# Patient Record
Sex: Female | Born: 1980
Health system: Southern US, Community
[De-identification: ages and names within clinical notes are randomized; demographics above are authoritative.]

## PROBLEM LIST (undated history)

## (undated) DIAGNOSIS — F411 Generalized anxiety disorder: Secondary | ICD-10-CM

## (undated) DIAGNOSIS — R7989 Other specified abnormal findings of blood chemistry: Secondary | ICD-10-CM

## (undated) DIAGNOSIS — K589 Irritable bowel syndrome without diarrhea: Secondary | ICD-10-CM

## (undated) DIAGNOSIS — I10 Essential (primary) hypertension: Secondary | ICD-10-CM

## (undated) DIAGNOSIS — R569 Unspecified convulsions: Secondary | ICD-10-CM

## (undated) DIAGNOSIS — Z973 Presence of spectacles and contact lenses: Secondary | ICD-10-CM

## (undated) DIAGNOSIS — Z86718 Personal history of other venous thrombosis and embolism: Secondary | ICD-10-CM

## (undated) DIAGNOSIS — K219 Gastro-esophageal reflux disease without esophagitis: Secondary | ICD-10-CM

## (undated) DIAGNOSIS — N939 Abnormal uterine and vaginal bleeding, unspecified: Secondary | ICD-10-CM

## (undated) DIAGNOSIS — F32A Depression, unspecified: Secondary | ICD-10-CM

## (undated) DIAGNOSIS — I82409 Acute embolism and thrombosis of unspecified deep veins of unspecified lower extremity: Secondary | ICD-10-CM

## (undated) DIAGNOSIS — N3010 Interstitial cystitis (chronic) without hematuria: Secondary | ICD-10-CM

## (undated) DIAGNOSIS — R87618 Other abnormal cytological findings on specimens from cervix uteri: Secondary | ICD-10-CM

## (undated) DIAGNOSIS — D751 Secondary polycythemia: Secondary | ICD-10-CM

## (undated) DIAGNOSIS — M797 Fibromyalgia: Secondary | ICD-10-CM

## (undated) DIAGNOSIS — I493 Ventricular premature depolarization: Secondary | ICD-10-CM

## (undated) DIAGNOSIS — Z7901 Long term (current) use of anticoagulants: Secondary | ICD-10-CM

## (undated) DIAGNOSIS — Z8742 Personal history of other diseases of the female genital tract: Secondary | ICD-10-CM

## (undated) DIAGNOSIS — F329 Major depressive disorder, single episode, unspecified: Secondary | ICD-10-CM

## (undated) DIAGNOSIS — G43909 Migraine, unspecified, not intractable, without status migrainosus: Secondary | ICD-10-CM

## (undated) DIAGNOSIS — I639 Cerebral infarction, unspecified: Secondary | ICD-10-CM

## (undated) DIAGNOSIS — E782 Mixed hyperlipidemia: Secondary | ICD-10-CM

## (undated) DIAGNOSIS — N390 Urinary tract infection, site not specified: Secondary | ICD-10-CM

## (undated) DIAGNOSIS — Z8639 Personal history of other endocrine, nutritional and metabolic disease: Secondary | ICD-10-CM

## (undated) DIAGNOSIS — R8789 Other abnormal findings in specimens from female genital organs: Secondary | ICD-10-CM

## (undated) DIAGNOSIS — F419 Anxiety disorder, unspecified: Secondary | ICD-10-CM

## (undated) HISTORY — PX: CARPAL TUNNEL RELEASE: SHX101

## (undated) HISTORY — DX: Acute embolism and thrombosis of unspecified deep veins of unspecified lower extremity: I82.409

## (undated) HISTORY — PX: INTERSTIM IMPLANT PLACEMENT: SHX5130

## (undated) HISTORY — PX: TUBAL LIGATION: SHX77

## (undated) HISTORY — PX: OTHER SURGICAL HISTORY: SHX169

## (undated) HISTORY — DX: Anxiety disorder, unspecified: F41.9

## (undated) HISTORY — DX: Depression, unspecified: F32.A

## (undated) HISTORY — DX: Other abnormal cytological findings on specimens from cervix uteri: R87.618

---
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
## 2004-01-05 ENCOUNTER — Encounter (INDEPENDENT_AMBULATORY_CARE_PROVIDER_SITE_OTHER): Payer: Self-pay | Admitting: Specialist

## 2004-01-05 ENCOUNTER — Ambulatory Visit (HOSPITAL_COMMUNITY): Admission: RE | Admit: 2004-01-05 | Discharge: 2004-01-05 | Payer: Self-pay | Admitting: Urology

## 2004-01-05 ENCOUNTER — Ambulatory Visit (HOSPITAL_BASED_OUTPATIENT_CLINIC_OR_DEPARTMENT_OTHER): Admission: RE | Admit: 2004-01-05 | Discharge: 2004-01-05 | Payer: Self-pay | Admitting: Urology

## 2004-01-05 HISTORY — PX: CYSTOSCOPY WITH HYDRODISTENSION AND BIOPSY: SHX5127

## 2009-02-17 HISTORY — PX: INTERSTIM IMPLANT REMOVAL: SHX5131

## 2009-02-20 ENCOUNTER — Ambulatory Visit (HOSPITAL_COMMUNITY): Admission: AD | Admit: 2009-02-20 | Discharge: 2009-02-20 | Payer: Self-pay | Admitting: Urology

## 2010-03-07 DIAGNOSIS — R002 Palpitations: Secondary | ICD-10-CM | POA: Insufficient documentation

## 2010-10-26 ENCOUNTER — Encounter (INDEPENDENT_AMBULATORY_CARE_PROVIDER_SITE_OTHER): Payer: Self-pay | Admitting: Obstetrics

## 2010-10-26 ENCOUNTER — Ambulatory Visit (HOSPITAL_COMMUNITY)
Admission: AD | Admit: 2010-10-26 | Discharge: 2010-10-26 | Payer: Self-pay | Source: Home / Self Care | Attending: Obstetrics | Admitting: Obstetrics

## 2010-10-26 HISTORY — PX: DILATION AND CURETTAGE OF UTERUS: SHX78

## 2010-11-04 ENCOUNTER — Encounter
Admission: RE | Admit: 2010-11-04 | Discharge: 2010-11-04 | Payer: Self-pay | Source: Home / Self Care | Attending: Obstetrics & Gynecology | Admitting: Obstetrics & Gynecology

## 2010-11-04 LAB — TYPE AND SCREEN
ABO/RH(D): B NEG
Antibody Screen: POSITIVE
DAT, IgG: NEGATIVE
Unit division: 0
Unit division: 0

## 2010-11-04 LAB — HCG, QUANTITATIVE, PREGNANCY
hCG, Beta Chain, Quant, S: 13506 m[IU]/mL — ABNORMAL HIGH (ref <5)
hCG, Beta Chain, Quant, S: 9460 m[IU]/mL — ABNORMAL HIGH (ref <5)

## 2010-11-04 LAB — CBC
HCT: 36 % (ref 36.0–46.0)
HCT: 35.5 % — ABNORMAL LOW (ref 36.0–46.0)
Hemoglobin: 12.3 g/dL (ref 12.0–15.0)
Hemoglobin: 12.3 g/dL (ref 12.0–15.0)
MCH: 29.1 pg (ref 26.0–34.0)
MCH: 29.4 pg (ref 26.0–34.0)
MCHC: 34.2 g/dL (ref 30.0–36.0)
MCHC: 34.6 g/dL (ref 30.0–36.0)
MCV: 85.1 fL (ref 78.0–100.0)
MCV: 84.9 fL (ref 78.0–100.0)
Platelets: 243 10*3/uL (ref 150–400)
Platelets: 242 10*3/uL (ref 150–400)
RBC: 4.23 MIL/uL (ref 3.87–5.11)
RBC: 4.18 MIL/uL (ref 3.87–5.11)
RDW: 11.9 % (ref 11.5–15.5)
RDW: 11.7 % (ref 11.5–15.5)
WBC: 7.2 10*3/uL (ref 4.0–10.5)
WBC: 6.8 10*3/uL (ref 4.0–10.5)

## 2011-01-01 DIAGNOSIS — K589 Irritable bowel syndrome without diarrhea: Secondary | ICD-10-CM | POA: Insufficient documentation

## 2011-01-28 LAB — PREGNANCY, URINE: Preg Test, Ur: NEGATIVE

## 2011-01-28 LAB — HEMOGLOBIN AND HEMATOCRIT, BLOOD
HCT: 40.8 % (ref 36.0–46.0)
Hemoglobin: 14.3 g/dL (ref 12.0–15.0)

## 2011-03-04 NOTE — Op Note (Signed)
NAMEHUBERT, Linda [name]                 ACCOUNT NO.:  [id number]   MEDICAL RECORD NO.:  [id number]          PATIENT TYPE:  AMB   LOCATION:  DAY                          FACILITY:  [facility]   PHYSICIAN:  Martina Sinner, MD DATE OF BIRTH:  [birth year]   DATE OF PROCEDURE:  02/20/2009  DATE OF DISCHARGE:                               OPERATIVE REPORT   ASSISTANT:  Delia Chimes, NP.   SURGERY:  Malfunctioning InterStim; removal of malfunctioning InterStim  under fluoroscopy.   [name] InterStim is turned off.  It was for refractory frequency  from interstitial cystitis.  Void trial.  Call me.  Discharged to home.  See me in 2 weeks.   The device was aggravating her and soiled when she would sit down.   The patient was prepped and draped in the usual fashion in the prone  position.  Fluoroscopy was utilized.  I opened the left buttock incision  and easily opened the pseudocapsule using cutting current, and stayed  away from the device.  I easily removed the device.   I opened the midline incision approximately the size of my fingertip.  I  could feel the lead deep in the bottom of the incision.  I was able to  place [place] around it and deliver it.   Under fluoroscopic guidance, I was trying to pull up gently in a  straight direction, and the lead would not come out.  I dissected down  to the bony table with soft tissue.  I was able to pull straight back  and deliver the lead with a  little bit of a twisting action.   Both incisions were irrigated.  Vicryl 3-0 and 4-0 Vicryl was used for  the closure.  The lead was removed in total.  The patient was sent home  with Vicodin and Keflex.           ______________________________  Martina Sinner, MD  Electronically Signed     SAM/MEDQ  D:  02/20/2009  T:  02/20/2009  Job:  [id number]

## 2011-03-07 NOTE — Op Note (Signed)
NAME:  Linda [name], Linda [name]                              ACCOUNT NO.:  [id number]   MEDICAL RECORD NO.:  [id number]                   PATIENT TYPE:  AMB   LOCATION:  [facility]                                 FACILITY:  [facility]   PHYSICIAN:  Jamison Neighbor, M.D.               DATE OF BIRTH:  [birth year]   DATE OF PROCEDURE:  01/05/2004  DATE OF DISCHARGE:                                 OPERATIVE REPORT   SERVICE:  Urology.   PREOPERATIVE DIAGNOSES:  Chronic pelvic pain, probable interstitial  cystitis.   POSTOPERATIVE DIAGNOSES:  Chronic pelvic pain, probable interstitial  cystitis.   PROCEDURE:  Cystoscopy, urethral calibration, hydrodistention of the  bladder, bladder biopsy, Marcaine and Pyridium installation, Marcaine and  Kenalog injection.   SURGEON:  Jamison Neighbor, M.D.   ANESTHESIA:  General.   COMPLICATIONS:  None.   DRAINS:  None.   BRIEF HISTORY:  This 30 year old female was felt to have interstitial  cystitis. She underwent a potassium test and had a partially positive test.  The patient's initial score with water was an urgency score of 1 and a pain  score of 0, with potassium her urgency score went to 2 and her pain score  went to 1 1/2.  This did indicate some sensitivity but fell short of the  threshold for a positive test because the published standard indicates that  with interstitial cystitis the test should go up by two points or greater.   The patient was told she probably had interstitial cystitis and we offered  her the option to start therapy; however, she felt  that she wished to have  a more definitive diagnosis.  She has agreed to undergo cystoscopy and  hydrodistention.  We note that the patient has subsequently told me that she  felt like she had increasing pain after she left the office following the  potassium test and this once again makes me even more convinced that there  is interstitial cystitis present.  However, the patient still feels that she  wishes to undergo hydrodistention. She understands the risks and benefits of  the procedure. Full and informed consent was obtained.   DESCRIPTION OF PROCEDURE:  After successful induction of general anesthesia,  the patient was placed in the dorsal lithotomy position, prepped with  Betadine and draped in the usual sterile fashion.  Careful bimanual  examination showed no abnormalities of the urethra, specifically no evidence  of a diverticulum. There was no cystocele, rectocele or enterocele. There  were no masses on bimanual exam.  The urethra was calibrated to [address] [place]  with female urethral sounds with no stenosis or stricture. The cystoscope  was inserted, the bladder was carefully inspected, no tumors or stones could  be seen. The patient underwent hydraulic over distention for five minutes at  100 cm pressure. When the bladder was drained, there was a terminal blood  tinge  and some glomerulation formation consistent with interstitial  cystitis. There was also one patchy area at the top that might be an early  Hunner's ulcer. The patient had almost a normal capacity at just under 1000  mL with normal being 1150 and the average for interstitial cystitis at 575.  We do note that the patient is very young and has had the condition for a  short period of time and it is very hopeful that with aggressive therapy she  will not progress to that level. The patient had a bladder biopsy, the  biopsy site was cauterized. This will be sent for mast cell analysis. The  bladder was drained, a mixture or Marcaine and Pyridium was left within the  bladder, Marcaine and Kenalog were injected periurethrally as a postop  block.  The patient received intraoperative Toradol and Zofran as well as a  B&O suppository. She tolerated the procedure well and was taken to the  recovery room in good condition.                                               Jamison Neighbor, M.D.    RJE/MEDQ  D:  01/05/2004  T:   01/05/2004  Job:  [id number]   cc:   Dois Davenport A. Rivard, M.D.  [address]  [place]  [place] [address]  Fax: [phone]

## 2011-10-21 DIAGNOSIS — Z8673 Personal history of transient ischemic attack (TIA), and cerebral infarction without residual deficits: Secondary | ICD-10-CM

## 2011-10-21 HISTORY — DX: Personal history of transient ischemic attack (TIA), and cerebral infarction without residual deficits: Z86.73

## 2012-09-27 DIAGNOSIS — M797 Fibromyalgia: Secondary | ICD-10-CM | POA: Insufficient documentation

## 2013-10-20 DIAGNOSIS — Z87898 Personal history of other specified conditions: Secondary | ICD-10-CM

## 2013-10-20 HISTORY — DX: Personal history of other specified conditions: Z87.898

## 2015-05-01 DIAGNOSIS — F411 Generalized anxiety disorder: Secondary | ICD-10-CM | POA: Insufficient documentation

## 2015-05-01 DIAGNOSIS — F988 Other specified behavioral and emotional disorders with onset usually occurring in childhood and adolescence: Secondary | ICD-10-CM | POA: Insufficient documentation

## 2015-05-01 DIAGNOSIS — F419 Anxiety disorder, unspecified: Secondary | ICD-10-CM | POA: Insufficient documentation

## 2015-07-08 ENCOUNTER — Emergency Department
Admission: EM | Admit: 2015-07-08 | Discharge: 2015-07-08 | Disposition: A | Discharge status: Home / Self Care | Payer: 59 | Source: Home / Self Care | Attending: Family Medicine | Admitting: Family Medicine

## 2015-07-08 ENCOUNTER — Encounter: Payer: Self-pay | Admitting: Emergency Medicine

## 2015-07-08 DIAGNOSIS — G43101 Migraine with aura, not intractable, with status migrainosus: Secondary | ICD-10-CM

## 2015-07-08 HISTORY — DX: Migraine, unspecified, not intractable, without status migrainosus: G43.909

## 2015-07-08 HISTORY — DX: Cerebral infarction, unspecified: I63.9

## 2015-07-08 HISTORY — DX: Unspecified convulsions: R56.9

## 2015-07-08 HISTORY — DX: Essential (primary) hypertension: I10

## 2015-07-08 MED ORDER — PROMETHAZINE HCL 25 MG PO TABS: 25.0000 mg | ORAL_TABLET | Freq: Four times a day (QID) | ORAL | Status: DC | PRN

## 2015-07-08 MED ORDER — METOCLOPRAMIDE HCL 5 MG/ML IJ SOLN
10.0000 mg | Freq: Once | INTRAMUSCULAR | Status: AC
  Administered 2015-07-08: 10 mg via INTRAMUSCULAR

## 2015-07-08 MED ORDER — KETOROLAC TROMETHAMINE 60 MG/2ML IM SOLN
60.0000 mg | Freq: Once | INTRAMUSCULAR | Status: AC
  Administered 2015-07-08: 60 mg via INTRAMUSCULAR

## 2015-07-08 MED ORDER — DIPHENHYDRAMINE HCL 25 MG PO CAPS
25.0000 mg | ORAL_CAPSULE | Freq: Once | ORAL | Status: AC
  Administered 2015-07-08: 25 mg via ORAL

## 2015-07-08 MED ORDER — DEXAMETHASONE SODIUM PHOSPHATE 10 MG/ML IJ SOLN
10.0000 mg | Freq: Once | INTRAMUSCULAR | Status: AC
  Administered 2015-07-08: 10 mg via INTRAMUSCULAR

## 2015-07-08 MED ORDER — HYDROCODONE-ACETAMINOPHEN 5-325 MG PO TABS: 1.0000 | ORAL_TABLET | Freq: Four times a day (QID) | ORAL | Status: DC | PRN

## 2015-07-08 NOTE — Discharge Instructions (Signed)
Norco/Vicodin (hydrocodone-acetaminophen) is a narcotic pain medication, do not combine these medications with others containing tylenol. While taking, do not drink alcohol, drive, or perform any other activities that requires focus while taking these medications.    Recurrent Migraine Headache A migraine headache is very bad, throbbing pain on one or both sides of your head. Recurrent migraines keep coming back. Talk to your doctor about what things may bring on (trigger) your migraine headaches. HOME CARE  Only take medicines as told by your doctor.  Lie down in a dark, quiet room when you have a migraine.  Keep a journal to find out if certain things bring on migraine headaches. For example, write down:  What you eat and drink.  How much sleep you get.  Any change to your diet or medicines.  Lessen how much alcohol you drink.  Quit smoking if you smoke.  Get enough sleep.  Lessen any stress in your life.  Keep lights dim if bright lights bother you or make your migraines worse. GET HELP IF:  Medicine does not help your migraines.  Your pain keeps coming back.  You have a fever. GET HELP RIGHT AWAY IF:   Your migraine becomes really bad.  You have a stiff neck.  You have trouble seeing.  Your muscles are weak, or you lose muscle control.  You lose your balance or have trouble walking.  You feel like you will pass out (faint), or you pass out.  You have really bad symptoms that are different than your first symptoms. MAKE SURE YOU:   Understand these instructions.  Will watch your condition.  Will get help right away if you are not doing well or get worse. Document Released: 07/15/2008 Document Revised: 10/11/2013 Document Reviewed: 06/13/2013 [facility] Patient Information 2015 [place], [place]. This information is not intended to replace advice given to you by your health care provider. Make sure you discuss any questions you have with your health care  provider.

## 2015-07-08 NOTE — ED Notes (Signed)
Pt c/o migraine HA x 2 days.  Nausea and vomitting.

## 2015-07-08 NOTE — ED Provider Notes (Signed)
CSN: [id number]     Arrival date & time 07/08/15  1621 History   First MD Initiated Contact with Patient 07/08/15 1638     Chief Complaint  Patient presents with  . Migraine   (Consider location/radiation/quality/duration/timing/severity/associated sxs/prior Treatment) HPI Pt is a 34yo female with hx of migraines, presenting to [facility] with c/o gradually worsening migraine headache for 2-3 days.  Pt states she is on daily Topamax and propranolol to help "keep migraines at bay" but states she cannot get rid of her current headache. Pain is aching, throbbing, 10/10 at this time, feels similar to previous migraines but more persistent. HA was gradual in onset, initially on Left side of head but now all over and radiating into her neck.  Associated blurred vision, photophobia, nausea and vomiting that started today.  Denies fever or chills. Denies head injuries or falls. States she works at [employer] where there is always stress. She does not recall any new stress and unsure what caused current migraine. Pt was being followed by a Headache specialist in [place] but states her provider recently left the practice so she is in the process of finding a new specialist by working with her PCP.  She has had reglan, toradol, and steroids before for her migraines.  Past Medical History  Diagnosis Date  . Hypertension   . Stroke   . Seizures   . Migraine    Past Surgical History  Procedure Laterality Date  . Carpal tunnel release    . Cesarean section    . Tenze     No family history on file. Social History  Substance Use Topics  . Smoking status: Unknown If Ever Smoked  . Smokeless tobacco: None  . Alcohol Use: No   OB History    No data available     Review of Systems  Eyes: Positive for photophobia and visual disturbance. Negative for pain and itching.  Gastrointestinal: Positive for nausea and vomiting. Negative for diarrhea.  Musculoskeletal: Positive for neck pain. Negative for myalgias,  arthralgias and neck stiffness.  Neurological: Positive for headaches. Negative for dizziness, seizures, syncope, facial asymmetry, speech difficulty, light-headedness and numbness.    Allergies  Review of patient's allergies indicates no known allergies.  Home Medications   Prior to Admission medications   Medication Sig Start Date End Date Taking? Authorizing Provider  Escitalopram Oxalate (LEXAPRO PO) Take by mouth.   Yes Historical Provider, MD  norethindrone-ethinyl estradiol-iron (ESTROSTEP FE,TILIA FE,TRI-LEGEST FE) 1-20/1-30/1-35 MG-MCG tablet Take 1 tablet by mouth daily.   Yes Historical Provider, MD  PROPRANOLOL HCL PO Take by mouth.   Yes Historical Provider, MD  Topiramate (TOPAMAX PO) Take by mouth.   Yes Historical Provider, MD  HYDROcodone-acetaminophen (NORCO/VICODIN) 5-325 MG per tablet Take 1-2 tablets by mouth every 6 (six) hours as needed. 07/08/15   Junius Finner, PA-C  promethazine (PHENERGAN) 25 MG tablet Take 1 tablet (25 mg total) by mouth every 6 (six) hours as needed for nausea or vomiting. 07/08/15   Junius Finner, PA-C   Meds Ordered and Administered this Visit   Medications  ketorolac (TORADOL) injection 60 mg (60 mg Intramuscular Given 07/08/15 1700)  metoCLOPramide (REGLAN) injection 10 mg (10 mg Intramuscular Given 07/08/15 1701)  diphenhydrAMINE (BENADRYL) capsule 25 mg (25 mg Oral Given 07/08/15 1655)  dexamethasone (DECADRON) injection 10 mg (10 mg Intramuscular Given 07/08/15 1702)    BP 118/82 mmHg  Pulse 66  Temp(Src) 98.3 F (36.8 C) (Oral)  Ht  (1.651 m)  Wt 174 lb (78.926 kg)  BMI 28.96 kg/m2  SpO2 100%  LMP 06/07/2015 (Approximate) No data found.   Physical Exam  Constitutional: She is oriented to person, place, and time. She appears well-developed and well-nourished. No distress.  HENT:  Head: Normocephalic and atraumatic.  Right Ear: Hearing, tympanic membrane, external ear and ear canal normal.  Left Ear: Hearing, tympanic  membrane, external ear and ear canal normal.  Nose: Nose normal.  Mouth/Throat: Uvula is midline, oropharynx is clear and moist and mucous membranes are normal.  Eyes: Conjunctivae and EOM are normal. Pupils are equal, round, and reactive to light. Right eye exhibits no discharge. Left eye exhibits no discharge. No scleral icterus.  Neck: Normal range of motion. Neck supple.  No nuchal rigidity or meningeal signs.  Cardiovascular: Normal rate, regular rhythm and normal heart sounds.   Pulmonary/Chest: Effort normal and breath sounds normal. No respiratory distress. She has no wheezes. She has no rales. She exhibits no tenderness.  Abdominal: Soft. She exhibits no distension. There is no rebound.  Musculoskeletal: Normal range of motion.  Neurological: She is alert and oriented to person, place, and time. She has normal strength. No cranial nerve deficit or sensory deficit. Coordination and gait normal. GCS eye subscore is 4. GCS verbal subscore is 5. GCS motor subscore is 6.  CN II-XII in tact. Speech is clear. Alert to person, place, and time. Normal coordination. Normal gait.  Skin: Skin is warm and dry. She is not diaphoretic.  Nursing note and vitals reviewed.   ED Course  Procedures (including critical care time)  Labs Review Labs Reviewed - No data to display  Imaging Review No results found.   MDM   1. Migraine with aura and with status migrainosus, not intractable    Pt with hx of migraines c/o persistent migraine that was gradual in onset, similar to prior migraines for 2-3 days. Normal neuro exam.  Tx in UC: Toradol  IM, Reglan  IM, Decadron  IM, benadryl  PO. Pt allowed to rest in darkened exam room for 20 minutes after medications were given.  Pt reports mild improvement in pain, improved from 10/10 to 7-8/10.   Recommended pt go to emergency department if she would like further relief of pain. Pt states she would prefer to go home and rest to see if  pain continues to improve. Pt requested tramadol, however, she is on Lexapro. Contraindication for giving tramadol as medication interaction may cause seizures. Pt given Norco instead. Pt's mother is at [facility] to drive her home. Pt states if pain medication and rest at home does not help, she will got to the emergency department. Encouraged her to continue working with PCP to find new headache specialist. Patient verbalized understanding and agreement with treatment plan.     Junius Finner, PA-C 07/08/15 [phone]

## 2015-12-28 DIAGNOSIS — Z8489 Family history of other specified conditions: Secondary | ICD-10-CM | POA: Insufficient documentation

## 2016-01-02 DIAGNOSIS — R569 Unspecified convulsions: Secondary | ICD-10-CM | POA: Insufficient documentation

## 2016-07-27 DIAGNOSIS — Z98891 History of uterine scar from previous surgery: Secondary | ICD-10-CM | POA: Insufficient documentation

## 2016-07-27 HISTORY — DX: History of uterine scar from previous surgery: Z98.891

## 2016-08-04 DIAGNOSIS — E559 Vitamin D deficiency, unspecified: Secondary | ICD-10-CM | POA: Insufficient documentation

## 2016-09-02 DIAGNOSIS — Z9851 Tubal ligation status: Secondary | ICD-10-CM

## 2016-09-02 HISTORY — DX: Tubal ligation status: Z98.51

## 2016-09-02 LAB — HM PAP SMEAR: HM Pap smear: NEGATIVE

## 2016-09-09 DIAGNOSIS — R87810 Cervical high risk human papillomavirus (HPV) DNA test positive: Secondary | ICD-10-CM | POA: Insufficient documentation

## 2016-09-09 HISTORY — DX: Cervical high risk human papillomavirus (HPV) DNA test positive: R87.810

## 2019-06-24 ENCOUNTER — Other Ambulatory Visit: Payer: Self-pay

## 2019-06-24 ENCOUNTER — Emergency Department (INDEPENDENT_AMBULATORY_CARE_PROVIDER_SITE_OTHER): Payer: PRIVATE HEALTH INSURANCE

## 2019-06-24 ENCOUNTER — Emergency Department
Admission: EM | Admit: 2019-06-24 | Discharge: 2019-06-24 | Disposition: A | Discharge status: Home / Self Care | Payer: 59 | Source: Home / Self Care

## 2019-06-24 ENCOUNTER — Encounter: Payer: Self-pay | Admitting: Emergency Medicine

## 2019-06-24 DIAGNOSIS — R300 Dysuria: Secondary | ICD-10-CM | POA: Diagnosis not present

## 2019-06-24 DIAGNOSIS — M545 Low back pain, unspecified: Secondary | ICD-10-CM

## 2019-06-24 DIAGNOSIS — R109 Unspecified abdominal pain: Secondary | ICD-10-CM

## 2019-06-24 DIAGNOSIS — I10 Essential (primary) hypertension: Secondary | ICD-10-CM

## 2019-06-24 LAB — POCT URINALYSIS DIP (MANUAL ENTRY)
Bilirubin, UA: NEGATIVE
Blood, UA: NEGATIVE
Glucose, UA: NEGATIVE mg/dL
Ketones, POC UA: NEGATIVE mg/dL
Leukocytes, UA: NEGATIVE
Nitrite, UA: NEGATIVE
Protein Ur, POC: NEGATIVE mg/dL
Spec Grav, UA: 1.01 (ref 1.010–1.025)
Urobilinogen, UA: 0.2 E.U./dL
pH, UA: 6.5 (ref 5.0–8.0)

## 2019-06-24 MED ORDER — TRAMADOL HCL 50 MG PO TABS: 50.0000 mg | ORAL_TABLET | Freq: Four times a day (QID) | ORAL | 0 refills | Status: DC | PRN

## 2019-06-24 NOTE — Discharge Instructions (Signed)
°  You may take 500mg  acetaminophen every 4-6 hours or in combination with ibuprofen 400-600mg  every 6-8 hours as needed for pain and inflammation.  Tramadol is strong pain medication. While taking, do not drink alcohol, drive, or perform any other activities that requires focus while taking these medications.   Please call to schedule a follow up appointment with family medicine next week for recheck of symptoms and for ongoing maintenance of your high blood pressure.  If your blood pressure remains untreated, you are at higher risk of heart attack, stroke, kidney failure, and early death.  Call 911 or go to the hospital if symptoms worsening- worsening pain, unable to urinate, fever, vomiting, or other new concerning symptoms develop.

## 2019-06-24 NOTE — ED Provider Notes (Signed)
[facility]    CSN: [id number] Arrival date & time: 06/24/19  1021      History   Chief Complaint Chief Complaint  Patient presents with  . Dysuria  . Back Pain    HPI Linda [name] is a 38 y.o. female.   HPI Linda [name] is a 38 y.o. female presenting to UC with c/o Right lower back pain for about 3 days and dysuria for about 5 days. Hx of UTIs in the past but symptoms feel different.  Back pain feels like a muscle spasm/pulled muscle as it is worse with movement but she does have burning at the end of her urine stream. No prior hx of kidney stones.  Pain is 8/10 at worst. 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  She has not been trying any medications for pain.  Denies fever, chills, n/v/d.  BP elevated in triage. Denies HA, dizziness, chest pain or SOB. Hx of HTN but she is not currently on BP medication and does not have a PCP.    Past Medical History:  Diagnosis Date  . Hypertension   . Migraine   . Seizures (HCC)   . Stroke [facility])     There are no active problems to display for this patient.   Past Surgical History:  Procedure Laterality Date  . CARPAL TUNNEL RELEASE    . CESAREAN SECTION    . tenze      OB History   No obstetric history on file.      Home Medications    Prior to Admission medications   Medication Sig Start Date End Date Taking? Authorizing Provider  traMADol (ULTRAM) 50 MG tablet Take 1 tablet (50 mg total) by mouth every 6 (six) hours as needed. 06/24/19   Lurene Shadow, PA-C    Family History Family History  Problem Relation Age of Onset  . Hypertension Mother   . Hypertension Father   . Clotting disorder Father     Social History Social History   Tobacco Use  . Smoking status: Never Smoker  . Smokeless tobacco: Never Used  Substance Use Topics  . Alcohol use: Yes  . Drug use: Not on file     Allergies   Patient has no known allergies.    Review of Systems Review of Systems  Constitutional: Negative for chills and fever.  HENT: Negative for congestion, ear pain, sore throat, trouble swallowing and voice change.   Respiratory: Negative for cough and shortness of breath.   Cardiovascular: Negative for chest pain and palpitations.  Gastrointestinal: Negative for abdominal pain, diarrhea, nausea and vomiting.  Genitourinary: Positive for dysuria and flank pain (Right). Negative for decreased urine volume, frequency, genital sores, hematuria, pelvic pain, urgency, vaginal discharge and vaginal pain.  Musculoskeletal: Positive for back pain and myalgias. Negative for arthralgias.  Skin: Negative for rash.  Neurological: Negative for dizziness and headaches.     Physical Exam Triage Vital Signs ED Triage Vitals  Enc Vitals Group     BP 06/24/19 1047 (!) 167/106     Pulse Rate 06/24/19 1047 83     Resp --      Temp 06/24/19 1047 98.5 F (36.9 C)     Temp Source 06/24/19 1047 Oral     SpO2 06/24/19 1047 97 %     Weight 06/24/19 1049 212 lb (96.2 kg)  Height 06/24/19 1049 5\' 5"  (1.651 m)     Head Circumference --      Peak Flow --      Pain Score 06/24/19 1048 8     Pain Loc --      Pain Edu? --      Excl. in GC? --    No data found.  Updated Vital Signs BP (!) 158/105 (BP Location: Left Arm)   Pulse 83   Temp 98.5 F (36.9 C) (Oral)   Ht 5\' 5"  (1.651 m)   Wt 212 lb (96.2 kg)   LMP 06/10/2019 (Exact Date)   SpO2 97%   BMI 35.28 kg/m   Visual Acuity Right Eye Distance:   Left Eye Distance:   Bilateral Distance:    Right Eye Near:   Left Eye Near:    Bilateral Near:     Physical Exam Vitals signs and nursing note reviewed.  Constitutional:      General: She is not in acute distress.    Appearance: Normal appearance. She is well-developed. She is not ill-appearing, toxic-appearing or diaphoretic.  HENT:     Head: Normocephalic and atraumatic.     Mouth/Throat:     Mouth: Mucous membranes are  moist.  Neck:     Musculoskeletal: Normal range of motion.  Cardiovascular:     Rate and Rhythm: Normal rate and regular rhythm.  Pulmonary:     Effort: Pulmonary effort is normal.     Breath sounds: Normal breath sounds.  Abdominal:     General: There is no distension.     Palpations: Abdomen is soft.     Tenderness: There is no abdominal tenderness. There is right CVA tenderness. There is no left CVA tenderness.  Musculoskeletal: Normal range of motion.        General: Tenderness (right lumbar muscles) present.       Back:  Skin:    General: Skin is warm and dry.     Findings: No bruising, erythema or rash.  Neurological:     Mental Status: She is alert and oriented to person, place, and time.  Psychiatric:        Behavior: Behavior normal.      UC Treatments / Results  Labs (all labs ordered are listed, but only abnormal results are displayed) Labs Reviewed  URINE CULTURE  POCT URINALYSIS DIP (MANUAL ENTRY)    EKG   Radiology [place] Renal  Result Date: 06/24/2019 CLINICAL DATA:  Right-sided flank pain EXAM: RENAL / URINARY TRACT ULTRASOUND COMPLETE COMPARISON:  None. FINDINGS: Right Kidney: Renal measurements: 11.7 x 5.2 x 5.4 cm. = volume: 172 mL . Echogenicity within normal limits. No mass or hydronephrosis visualized. Left Kidney: Renal measurements: 11.0 x 5.8 x 5.3 cm = volume: 179 mL. Echogenicity within normal limits. No mass or hydronephrosis visualized. Bladder: Appears normal for degree of bladder distention. Prevoid volume of 221 mL is noted with a postvoid volume of 18 mL. IMPRESSION: No acute abnormality noted. Electronically Signed   By: Alcide CleverMark  Lukens M.D.   On: 06/24/2019 12:10    Procedures Procedures (including critical care time)  Medications Ordered in UC Medications - No data to display  Initial Impression / Assessment and Plan / UC Course  I have reviewed the triage vital signs and the nursing notes.  Pertinent labs & imaging results that were  available during my care of the patient were reviewed by me and considered in my medical decision making (see chart for details).  UA unremarkable Culture sent Reviewed U/S with pt Offered muscle relaxer for suspected back muscle strain, pt declined stating she did take one of her mother's clonopin last night w/o relief.  Stressed importance of f/u with PCP for her BP AVS provided.  Final Clinical Impressions(s) / UC Diagnoses   Final diagnoses:  Dysuria  Uncontrolled hypertension  Acute right-sided low back pain without sciatica     Discharge Instructions      You may take 500mg  acetaminophen every 4-6 hours or in combination with ibuprofen 400-600mg  every 6-8 hours as needed for pain and inflammation.  Tramadol is strong pain medication. While taking, do not drink alcohol, drive, or perform any other activities that requires focus while taking these medications.   Please call to schedule a follow up appointment with family medicine next week for recheck of symptoms and for ongoing maintenance of your high blood pressure.  If your blood pressure remains untreated, you are at higher risk of heart attack, stroke, kidney failure, and early death.  Call 911 or go to the hospital if symptoms worsening- worsening pain, unable to urinate, fever, vomiting, or other new concerning symptoms develop.       ED Prescriptions    Medication Sig Dispense Auth. Provider   traMADol (ULTRAM) 50 MG tablet Take 1 tablet (50 mg total) by mouth every 6 (six) hours as needed. 10 tablet Noe Gens, PA-C     Controlled Substance Prescriptions [place] Controlled Substance Registry consulted? Yes, I have consulted the [place] Controlled Substances Registry for this patient, and feel the risk/benefit ratio today is favorable for proceeding with this prescription for a controlled substance.   Noe Gens, [place] 06/24/19 1228

## 2019-06-24 NOTE — ED Triage Notes (Signed)
Dysuria x 5 days, past 3 days, LBP on right side, spasms

## 2019-06-26 ENCOUNTER — Telehealth: Payer: Self-pay | Admitting: Emergency Medicine

## 2019-06-26 LAB — URINE CULTURE
MICRO NUMBER:: 849315
SPECIMEN QUALITY:: ADEQUATE

## 2019-06-26 NOTE — Telephone Encounter (Signed)
Patient called with urine culture results; she will start on Keflex 500mg  po bid for 7 days per Leeroy Cha; she has not improved and has made appt. With her PCP for this coming week.

## 2019-08-05 DIAGNOSIS — N921 Excessive and frequent menstruation with irregular cycle: Secondary | ICD-10-CM | POA: Insufficient documentation

## 2019-08-05 HISTORY — DX: Excessive and frequent menstruation with irregular cycle: N92.1

## 2019-11-30 ENCOUNTER — Other Ambulatory Visit: Payer: Self-pay

## 2019-11-30 ENCOUNTER — Emergency Department (INDEPENDENT_AMBULATORY_CARE_PROVIDER_SITE_OTHER)
Admission: EM | Admit: 2019-11-30 | Discharge: 2019-11-30 | Disposition: A | Discharge status: Home / Self Care | Payer: BC Managed Care – PPO | Source: Home / Self Care

## 2019-11-30 ENCOUNTER — Encounter: Payer: Self-pay | Admitting: *Deleted

## 2019-11-30 DIAGNOSIS — R202 Paresthesia of skin: Secondary | ICD-10-CM

## 2019-11-30 DIAGNOSIS — Z882 Allergy status to sulfonamides status: Secondary | ICD-10-CM | POA: Diagnosis not present

## 2019-11-30 DIAGNOSIS — M542 Cervicalgia: Secondary | ICD-10-CM

## 2019-11-30 DIAGNOSIS — I10 Essential (primary) hypertension: Secondary | ICD-10-CM

## 2019-11-30 DIAGNOSIS — Z8673 Personal history of transient ischemic attack (TIA), and cerebral infarction without residual deficits: Secondary | ICD-10-CM | POA: Diagnosis not present

## 2019-11-30 DIAGNOSIS — R110 Nausea: Secondary | ICD-10-CM | POA: Diagnosis not present

## 2019-11-30 DIAGNOSIS — Z87891 Personal history of nicotine dependence: Secondary | ICD-10-CM | POA: Diagnosis not present

## 2019-11-30 DIAGNOSIS — Z79899 Other long term (current) drug therapy: Secondary | ICD-10-CM | POA: Diagnosis not present

## 2019-11-30 DIAGNOSIS — R079 Chest pain, unspecified: Secondary | ICD-10-CM

## 2019-11-30 DIAGNOSIS — Z888 Allergy status to other drugs, medicaments and biological substances status: Secondary | ICD-10-CM | POA: Diagnosis not present

## 2019-11-30 DIAGNOSIS — K219 Gastro-esophageal reflux disease without esophagitis: Secondary | ICD-10-CM | POA: Diagnosis not present

## 2019-11-30 DIAGNOSIS — G459 Transient cerebral ischemic attack, unspecified: Secondary | ICD-10-CM | POA: Diagnosis not present

## 2019-11-30 DIAGNOSIS — M5489 Other dorsalgia: Secondary | ICD-10-CM | POA: Diagnosis not present

## 2019-11-30 DIAGNOSIS — R0789 Other chest pain: Secondary | ICD-10-CM | POA: Diagnosis not present

## 2019-11-30 DIAGNOSIS — R0602 Shortness of breath: Secondary | ICD-10-CM | POA: Diagnosis not present

## 2019-11-30 DIAGNOSIS — Z91018 Allergy to other foods: Secondary | ICD-10-CM | POA: Diagnosis not present

## 2019-11-30 DIAGNOSIS — F419 Anxiety disorder, unspecified: Secondary | ICD-10-CM | POA: Diagnosis not present

## 2019-11-30 MED ORDER — ACETAMINOPHEN 325 MG PO TABS
650.0000 mg | ORAL_TABLET | Freq: Once | ORAL | Status: AC
  Administered 2019-11-30: 650 mg via ORAL

## 2019-11-30 NOTE — ED Provider Notes (Signed)
[facility]    CSN: [id number] Arrival date & time: 11/30/19  1148      History   Chief Complaint Chief Complaint  Patient presents with  . Neck Pain  . Shoulder Pain  . Shortness of Breath    HPI Linda [name] is a 39 y.o. female.  Medical history significant for hypertension which is not currently managed and a history of a stroke 7 years ago.  HPI  Patient presents with acute onset of neck pain beginning yesterday. She reports as the day progressed the pain radiated down into her right sided chest wall.  The chest wall pain felt as though something heavy was pressing on her chest.  As she laid down the chest pain worsened to the point that she was having difficulty breathing. Upon awakening today she endorsed gradually worsening right arm heaviness, she continues to have the neck and right-sided chest wall pain, and now complains of numbness and tingling in her right digits. She has no symptoms occurring on her left side of body. Past Medical History:  Diagnosis Date  . Hypertension   . Migraine   . Seizures ([employer])   . Stroke [facility])     There are no problems to display for this patient.   Past Surgical History:  Procedure Laterality Date  . CARPAL TUNNEL RELEASE    . CESAREAN SECTION    . tenze      OB History   No obstetric history on file.      Home Medications    Prior to Admission medications   Not on File    Family History Family History  Problem Relation Age of Onset  . Hypertension Mother   . Hypertension Father   . Clotting disorder Father     Social History Social History   Tobacco Use  . Smoking status: Never Smoker  . Smokeless tobacco: Never Used  Substance Use Topics  . Alcohol use: Yes  . Drug use: Never     Allergies   Codeine, Propoxyphene, and Sulfa antibiotics   Review of Systems Review of Systems Pertinent negatives listed in HPI Physical Exam Triage Vital Signs ED Triage Vitals  Enc Vitals Group     BP  11/30/19 1201 (!) 171/128     Pulse Rate 11/30/19 1201 82     Resp 11/30/19 1201 18     Temp 11/30/19 1201 98.1 F (36.7 C)     Temp Source 11/30/19 1201 Oral     SpO2 11/30/19 1201 99 %     Weight 11/30/19 1202 214 lb (97.1 kg)     Height 11/30/19 1202 5' 5.5" (1.664 m)     Head Circumference --      Peak Flow --      Pain Score 11/30/19 1201 7     Pain Loc --      Pain Edu? --      Excl. in [place]? --    No data found.  Updated Vital Signs BP (!) 171/128 (BP Location: Right Arm)   Pulse 82   Temp 98.1 F (36.7 C) (Oral)   Resp 18   Ht 5' 5.5" (1.664 m)   Wt 214 lb (97.1 kg)   LMP 11/21/2019   SpO2 99%   BMI 35.07 kg/m   Visual Acuity Right Eye Distance:   Left Eye Distance:   Bilateral Distance:    Right Eye Near:   Left Eye Near:    Bilateral Near:  Physical Exam Constitutional:      General: She is in acute distress.     Appearance: She is ill-appearing.  Cardiovascular:     Rate and Rhythm: Normal rate.  Chest:     Chest wall: Tenderness present.  Musculoskeletal:     Cervical back: Rigidity present.  Neurological:     Mental Status: She is oriented to person, place, and time.     Motor: Weakness present.      UC Treatments / Results  Labs (all labs ordered are listed, but only abnormal results are displayed) Labs Reviewed - No data to display  EKG ECC, NSR with Left Atrial Enlargement, No ST changes  Radiology No results found.  Procedures Procedures (including critical care time)  Medications Ordered in UC Medications - No data to display  Initial Impression / Assessment and Plan / UC Course  I have reviewed the triage vital signs and the nursing notes.  Pertinent labs & imaging results that were available during my care of the patient were reviewed by me and considered in my medical decision making (see chart for details).    Neck pain Chest pain, unspecified type Paresthesia of right arm Accelerated hypertension Patient  presents today with 24 hours of neck and chest pain.  On today she developed unilateral right upper extremity numbness and tingling.  On arrival patient's blood pressure is elevated and she has a history of hypertension and has been off of her blood pressure medicines for 3 years.  EKG shows some left atrial enlargement and patient has right-sided upper extremity weakness when compared to the left.  Given patient's history and current accelerated hypertension she warrants further evaluation to rule out a CVA vs MI in the setting of an ER. Final Clinical Impressions(s) / UC Diagnoses   Final diagnoses:  Neck pain  Chest pain, unspecified type  Paresthesia of right arm   Discharge Instructions   None    ED Prescriptions    None     PDMP not reviewed this encounter.   Bing Neighbors, FNP 12/01/19 1130

## 2019-11-30 NOTE — ED Triage Notes (Signed)
Pt c/o RT side shoulder, neck and chest pain, tight, and stabbing x 1 day. Some SOB. Denies injury. Tingling down her RT arm.

## 2019-12-01 ENCOUNTER — Telehealth: Payer: Self-pay | Admitting: Family Medicine

## 2019-12-01 NOTE — Telephone Encounter (Signed)
Left message for patient to call for answers to questions.

## 2019-12-01 NOTE — Telephone Encounter (Signed)
Please follow-up with patient visit yesterday in which she was transported to ER for evaluation of stroke symptoms, SOB, and chest pain. In limited view of [facility] notes, appears she was d/c home. Wanted to know if she was provided any medication for her blood pressure and if she has a follow-up with a PCP? If BP medication was not resume, I can send over Amlodipine 5 mg to pharmacy and we can have her scheduled at [facility] primary care for PCP follow-up if she is without a PCP.  Thanks,  Joaquin Courts, FNP

## 2019-12-06 ENCOUNTER — Encounter: Payer: Self-pay | Admitting: Medical-Surgical

## 2019-12-06 ENCOUNTER — Ambulatory Visit (INDEPENDENT_AMBULATORY_CARE_PROVIDER_SITE_OTHER): Payer: BC Managed Care – PPO | Admitting: Medical-Surgical

## 2019-12-06 ENCOUNTER — Other Ambulatory Visit: Payer: Self-pay

## 2019-12-06 VITALS — BP 161/121 | HR 101 | Temp 98.3°F | Ht 65.5 in | Wt 213.1 lb

## 2019-12-06 DIAGNOSIS — R079 Chest pain, unspecified: Secondary | ICD-10-CM | POA: Diagnosis not present

## 2019-12-06 DIAGNOSIS — Z6834 Body mass index (BMI) 34.0-34.9, adult: Secondary | ICD-10-CM | POA: Insufficient documentation

## 2019-12-06 DIAGNOSIS — Z7689 Persons encountering health services in other specified circumstances: Secondary | ICD-10-CM | POA: Diagnosis not present

## 2019-12-06 DIAGNOSIS — I10 Essential (primary) hypertension: Secondary | ICD-10-CM

## 2019-12-06 DIAGNOSIS — F411 Generalized anxiety disorder: Secondary | ICD-10-CM | POA: Insufficient documentation

## 2019-12-06 DIAGNOSIS — Z86718 Personal history of other venous thrombosis and embolism: Secondary | ICD-10-CM | POA: Insufficient documentation

## 2019-12-06 DIAGNOSIS — G43109 Migraine with aura, not intractable, without status migrainosus: Secondary | ICD-10-CM

## 2019-12-06 DIAGNOSIS — Z8673 Personal history of transient ischemic attack (TIA), and cerebral infarction without residual deficits: Secondary | ICD-10-CM | POA: Insufficient documentation

## 2019-12-06 MED ORDER — LISINOPRIL-HYDROCHLOROTHIAZIDE 20-25 MG PO TABS: 1.0000 | ORAL_TABLET | Freq: Every day | ORAL | 3 refills | Status: DC

## 2019-12-06 MED ORDER — MELOXICAM 15 MG PO TABS: 15.0000 mg | ORAL_TABLET | Freq: Every day | ORAL | 0 refills | Status: DC

## 2019-12-06 MED ORDER — ESCITALOPRAM OXALATE 10 MG PO TABS: 10.0000 mg | ORAL_TABLET | Freq: Every day | ORAL | 1 refills | Status: DC

## 2019-12-06 NOTE — Progress Notes (Signed)
New Patient Office Visit  Subjective:  Patient ID: Linda [name], female    DOB: 29-Mar-1981  Age: 39 y.o. MRN: [id number]  CC: No chief complaint on file.   HPI [name] presents to establish care and follow up from emergency room evaluation of chest pain.   Migraines with aura- used to take Topamax 200mg  daily and had Botox injections. Got off all medications with her pregnancy and never resumed. 2-3 headaches per week that are related to tension. 3 times per month are debilitating. Photo/phonophobia, nausea. Lasts from 6 hrs to a couple of days. Excedrin migraine prn, minimal relief.  Chest pain- workup at UC negative. Still continuing. Starts as ice pick on scapula, pressure on right chest that feels like heartburn. Worse with lying down. Tender to touch. Tylenol and ibuprofen twice daily, some relief. Heating pad helps a little.  GAD- restarted lexapro 10mg  daily. Gets short tempered and angry very quickly when feeling overwhelmed. Triggered by stress and feeling out of control. No interest in counseling. Only taking Klonopin if she has to, usually at night before bed.   HTN- checking BP at home usually reading 160s/120s. Taking HCTZ 25mg  daily. BLE edema most days. SOB with exertion. Occasional palpitations with activity.   Weight- difficulty losing weight despite diet and exercise. Tried herbalife but caused palpitations. Counting calories, allowing 1400 daily. Tried intermittent fasting, stopped because she didn't feel it was healthy to go so long without eating.  Stroke- on 31th birthday. Had severe headache, unilateral vision loss, and ringing in ears. Was hospitalized for 2 days. Residual mild blurry vision left eye and mild hearing decrease left ear.   DVT in leg- left leg 6 years ago. Had a seizure and fractured ankle. Clot developed 2 days later, was given Lovenox for 2 weeks then cleared.   Has an appointment with OB/GYN within the next month for discussion of hormones and for  repeat Pap smear. Last pap showed abnormal cells, instructed to repeat in a year but did not.  Past Medical History:  Diagnosis Date  . Hypertension   . Migraine   . Seizures (HCC)   . Stroke [facility])     Past Surgical History:  Procedure Laterality Date  . CARPAL TUNNEL RELEASE    . CESAREAN SECTION    . tenze      Family History  Problem Relation Age of Onset  . Hypertension Mother   . Hypertension Father   . Clotting disorder Father     Social History   Socioeconomic History  . Marital status: Single    Spouse name: Not on file  . Number of children: Not on file  . Years of education: Not on file  . Highest education level: Not on file  Occupational History  . Not on file  Tobacco Use  . Smoking status: Never Smoker  . Smokeless tobacco: Never Used  Substance and Sexual Activity  . Alcohol use: Yes  . Drug use: Never  . Sexual activity: Not on file  Other Topics Concern  . Not on file  Social History Narrative  . Not on file   Social Determinants of Health   Financial Resource Strain:   . Difficulty of Paying Living Expenses: Not on file  Food Insecurity:   . Worried About in the Last Year: Not on file  . Ran Out of Food in the Last Year: Not on file  Transportation Needs:   . Lack of Transportation (Medical): Not on  file  . Lack of Transportation (Non-Medical): Not on file  Physical Activity:   . Days of Exercise per Week: Not on file  . Minutes of Exercise per Session: Not on file  Stress:   . Feeling of Stress : Not on file  Social Connections:   . Frequency of Communication with Friends and Family: Not on file  . Frequency of Social Gatherings with Friends and Family: Not on file  . Attends Religious Services: Not on file  . Active Member of Clubs or Organizations: Not on file  . Attends Banker Meetings: Not on file  . Marital Status: Not on file  Intimate Partner Violence:   . Fear of Current or Ex-Partner:  Not on file  . Emotionally Abused: Not on file  . Physically Abused: Not on file  . Sexually Abused: Not on file    ROS Review of Systems  Constitutional: Negative for chills, fatigue, fever and unexpected weight change.  HENT: Negative for congestion, rhinorrhea and sore throat.   Respiratory: Positive for shortness of breath (with activity or when lying down). Negative for cough and chest tightness.   Cardiovascular: Positive for chest pain, palpitations (occasional) and leg swelling.  Gastrointestinal: Negative for abdominal pain, constipation, diarrhea and nausea.  Genitourinary: Negative for decreased urine volume, dysuria, frequency and urgency.  Neurological: Negative for dizziness, light-headedness and headaches.  Psychiatric/Behavioral: Positive for sleep disturbance (ongoing, lifelong, averages around 4 hours per night). Negative for self-injury and suicidal ideas. The patient is nervous/anxious.     Objective:   Today's Vitals: LMP 11/21/2019   Physical Exam Vitals reviewed.  Constitutional:      General: She is not in acute distress.    Appearance: Normal appearance.  HENT:     Head: Normocephalic and atraumatic.  Cardiovascular:     Rate and Rhythm: Normal rate and regular rhythm.     Pulses: Normal pulses.     Heart sounds: Normal heart sounds. No murmur. No friction rub. No gallop.   Pulmonary:     Effort: Pulmonary effort is normal. No respiratory distress.     Breath sounds: Normal breath sounds.  Musculoskeletal:     Right lower leg: No edema.     Left lower leg: No edema.     Comments: Severe tenderness to light palpation over right scapula and right upper chest.   Skin:    General: Skin is warm and dry.  Neurological:     Mental Status: She is alert and oriented to person, place, and time.  Psychiatric:        Mood and Affect: Mood normal.        Behavior: Behavior normal.        Thought Content: Thought content normal.        Judgment: Judgment  normal.     Assessment & Plan:   HTN Adding lisinopril 20mg  daily. Prescribed as a combination pill with HCTZ. Continue checking BP at home, keep journal. Will need nurse visit in 2 weeks. Advised to bring home BP cuff.  GAD Continue Lexapro 10mg  daily. Declined counseling referral.   Chest pain Given tenderness to light palpation over right scapula, and right upper chest, suspect musculoskeletal etiology. Will trial Meloxicam 15mg  daily for two weeks then daily as needed. May continue to use heat/ice and Tylenol as desired.  Migraines with aura Open to restarting medication for management but would like to get BP and anxiety under control before addressing more issues.   BMI 34.93-  Class 1 obesity Interested in weight loss medications. Would like to get her BP under control then will discuss options for assisting with weight loss.   History of DVT Resolved. No anticoagulant prophylaxis necessary.  Stroke Resolved.  Follow-up: Return in about 3 months (around 03/04/2020) for HTN, GAD follow up.   Clearnce Sorrel, DNP, APRN, FNP-BC [facility]

## 2019-12-13 ENCOUNTER — Encounter: Payer: BC Managed Care – PPO | Admitting: Advanced Practice Midwife

## 2019-12-17 ENCOUNTER — Emergency Department (INDEPENDENT_AMBULATORY_CARE_PROVIDER_SITE_OTHER): Payer: BC Managed Care – PPO

## 2019-12-17 ENCOUNTER — Emergency Department (INDEPENDENT_AMBULATORY_CARE_PROVIDER_SITE_OTHER)
Admission: EM | Admit: 2019-12-17 | Discharge: 2019-12-17 | Disposition: A | Discharge status: Home / Self Care | Payer: BC Managed Care – PPO | Source: Home / Self Care | Attending: Family Medicine | Admitting: Family Medicine

## 2019-12-17 ENCOUNTER — Encounter: Payer: Self-pay | Admitting: Emergency Medicine

## 2019-12-17 ENCOUNTER — Other Ambulatory Visit: Payer: Self-pay

## 2019-12-17 DIAGNOSIS — M7989 Other specified soft tissue disorders: Secondary | ICD-10-CM

## 2019-12-17 DIAGNOSIS — S92002A Unspecified fracture of left calcaneus, initial encounter for closed fracture: Secondary | ICD-10-CM | POA: Diagnosis not present

## 2019-12-17 DIAGNOSIS — M25472 Effusion, left ankle: Secondary | ICD-10-CM | POA: Diagnosis not present

## 2019-12-17 MED ORDER — ACETAMINOPHEN 500 MG PO TABS
1000.0000 mg | ORAL_TABLET | Freq: Once | ORAL | Status: AC
  Administered 2019-12-17: 1000 mg via ORAL

## 2019-12-17 MED ORDER — TRAMADOL HCL 50 MG PO TABS: 50.0000 mg | ORAL_TABLET | Freq: Four times a day (QID) | ORAL | 0 refills | Status: AC | PRN

## 2019-12-17 MED ORDER — KETOROLAC TROMETHAMINE 60 MG/2ML IM SOLN
60.0000 mg | Freq: Once | INTRAMUSCULAR | Status: AC
  Administered 2019-12-17: 60 mg via INTRAMUSCULAR

## 2019-12-17 NOTE — Discharge Instructions (Addendum)
Wear ace wrap.  Avoid all weight bearing; use crutches.  Elevate leg/foot.  Apply ice pack for 20 to 30 minutes, 3 to 4 times daily  Continue until pain and swelling decrease.

## 2019-12-17 NOTE — ED Triage Notes (Addendum)
Landed wrong while dismounting from trampoline; left ankle edematous and too painful to walk on. No OTCs. Brought from entry in wheelchair. Has not had influenza vacc this season. No known exposure to covid positive person.

## 2019-12-17 NOTE — ED Provider Notes (Signed)
[name]    CSN: [id number] Arrival date & time: 12/17/19  1314      History   Chief Complaint Chief Complaint  Patient presents with  . Ankle Injury    HPI Linda [name] is a 39 y.o. female.   While dismounting a trampoline about two hours ago, patient landed awkwardly on her left foot/ankle, resulting in immediate pain/swelling of her left foot/ankle.  She denies other injury  The history is provided by the patient and a relative.  Ankle Injury This is a new problem. Episode onset: 2 hours ago. The problem occurs constantly. The problem has not changed since onset.The symptoms are aggravated by standing and walking. Nothing relieves the symptoms. She has tried nothing for the symptoms.    Past Medical History:  Diagnosis Date  . Anxiety   . Depression   . DVT (deep venous thrombosis) (HCC)   . Hypertension   . Migraine   . Seizures ([facility]CC)   . Stroke [facility])     Patient Active Problem List   Diagnosis Date Noted  . Migraine with aura and without status migrainosus, not intractable 12/06/2019  . History of stroke 12/06/2019  . History of DVT of lower extremity 12/06/2019  . Chest pain 12/06/2019  . Essential hypertension 12/06/2019  . GAD (generalized anxiety disorder) 12/06/2019  . BMI 34.0-34.9,adult 12/06/2019    Past Surgical History:  Procedure Laterality Date  . CARPAL TUNNEL RELEASE    . CESAREAN SECTION    . INTERSTIM IMPLANT PLACEMENT    . tenze    . TUBAL LIGATION      OB History   No obstetric history on file.      Home Medications    Prior to Admission medications   Medication Sig Start Date End Date Taking? Authorizing Provider  aspirin 81 MG chewable tablet Chew by mouth daily.   Yes [provider]  clonazePAM (KLONOPIN) 0.5 MG tablet Take 0.5 mg by mouth 2 (two) times daily as needed. 11/30/19   [provider]  escitalopram (LEXAPRO) 10 MG tablet Take 1 tablet (10 mg total) by mouth daily. 12/06/19    Christen Butter, NP  hydrochlorothiazide (HYDRODIURIL) 25 MG tablet Take 1 tablet by mouth daily. 08/05/19   [provider]  lisinopril-hydrochlorothiazide (ZESTORETIC) 20-25 MG tablet Take 1 tablet by mouth daily. 12/06/19   Christen Butter, NP  meloxicam (MOBIC) 15 MG tablet Take 1 tablet (15 mg total) by mouth daily. 12/06/19   Christen Butter, NP  traMADol (ULTRAM) 50 MG tablet Take 1 tablet (50 mg total) by mouth every 6 (six) hours as needed for up to 5 days for moderate pain or severe pain. 12/17/19 12/22/19  Lattie Haw, MD    Family History Family History  Problem Relation Age of Onset  . Hypertension Mother   . Hypertension Father   . Clotting disorder Father   . Diabetes Father   . Deep vein thrombosis Father   . Breast cancer Maternal Aunt   . Factor V Leiden deficiency Maternal Aunt   . Deep vein thrombosis Paternal Aunt   . Stroke Maternal Grandmother   . Prostate cancer Maternal Grandfather   . Stroke Paternal Grandfather   . Colon cancer Paternal Grandfather   . Breast cancer Maternal Great-grandmother     Social History Social History   Tobacco Use  . Smoking status: Former Games developer  . Smokeless tobacco: Never Used  Substance Use Topics  . Alcohol use: Yes  Alcohol/week: 3.0 standard drinks    Types: 3 Glasses of wine per week  . Drug use: Never     Allergies   Chocolate, Cocoa butter, Codeine, Propoxyphene, and Sulfa antibiotics   Review of Systems Review of Systems  Musculoskeletal: Positive for joint swelling. Negative for back pain.  All other systems reviewed and are negative.    Physical Exam Triage Vital Signs ED Triage Vitals  Enc Vitals Group     BP 12/17/19 1332 122/75     Pulse Rate 12/17/19 1332 (!) 103     Resp 12/17/19 1332 18     Temp 12/17/19 1332 98.3 F (36.8 C)     Temp Source 12/17/19 1332 Oral     SpO2 12/17/19 1332 98 %     Weight 12/17/19 1335 211 lb 10.3 oz (96 kg)     Height 12/17/19 1335 5' 5.5" (1.664 m)     Head  Circumference --      Peak Flow --      Pain Score 12/17/19 1335 9     Pain Loc --      Pain Edu? --      Excl. in [place]? --    No data found.  Updated Vital Signs BP 122/75 (BP Location: Right Arm)   Pulse (!) 103   Temp 98.3 F (36.8 C) (Oral)   Resp 18   Ht 5' 5.5" (1.664 m)   Wt 96 kg   LMP 12/15/2019 (Exact Date)   SpO2 98%   BMI 34.68 kg/m   Visual Acuity Right Eye Distance:   Left Eye Distance:   Bilateral Distance:    Right Eye Near:   Left Eye Near:    Bilateral Near:     Physical Exam Vitals and nursing note reviewed.  Constitutional:      General: She is not in acute distress.    Appearance: She is obese.  HENT:     Head: Atraumatic.  Eyes:     Conjunctiva/sclera: Conjunctivae normal.     Pupils: Pupils are equal, round, and reactive to light.  Cardiovascular:     Rate and Rhythm: Tachycardia present.  Pulmonary:     Effort: Pulmonary effort is normal.  Musculoskeletal:     Left ankle: Swelling present. Tenderness present over the lateral malleolus and medial malleolus. No base of 5th metatarsal or proximal fibula tenderness. Decreased range of motion.     Left Achilles Tendon: Normal.       Feet:     Comments: Left ankle has diffuse tenderness/swelling lateral malleolus.  Mild tenderness over medial malleolus.  Skin:    General: Skin is warm and dry.      UC Treatments / Results  Labs (all labs ordered are listed, but only abnormal results are displayed) Labs Reviewed - No data to display  EKG   Radiology DG Ankle Complete Left  Result Date: 12/17/2019 CLINICAL DATA:  39 year old female with trampoline injury EXAM: LEFT ANKLE COMPLETE - 3+ VIEW COMPARISON:  None. FINDINGS: Anterior view demonstrates lateral soft tissue swelling. Questionable cortical defect on the lateral calcaneus on the anterior view not seen on other views. Lateral view of the ankle demonstrates tibiotalar joint effusion. Ankle mortise is congruent IMPRESSION: Cortical  irregularity on the lateral calcaneus on the anterior view with tibiotalar joint effusion, suspected to represent calcaneal fracture. Further evaluation with ankle CT may be useful. Soft tissue swelling on the lateral ankle. Electronically Signed   By: Corrie Mckusick D.O.  On: 12/17/2019 13:55    Procedures Procedures (including critical care time)  Medications Ordered in UC Medications  ketorolac (TORADOL) injection 60 mg (has no administration in time range)  acetaminophen (TYLENOL) tablet 1,000 mg (1,000 mg Oral Given 12/17/19 1415)    Initial Impression / Assessment and Plan / UC Course  I have reviewed the triage vital signs and the nursing notes.  Pertinent labs & imaging results that were available during my care of the patient were reviewed by me and considered in my medical decision making (see chart for details).    Administered Toradol 60mg  IM  Ace wrap applied.  Dispensed crutches. Rx for tramadol (patient reports that she has had no adverse effects in the past). Controlled Substance Prescriptions I have consulted the [place] Controlled Substances Registry for this patient, and feel the risk/benefit ratio today is favorable for proceeding with this prescription for a controlled substance.   I personally reviewed Left ankle x-rays. Followup with Dr. ([facility]) in about two days for fracture management.   Final Clinical Impressions(s) / UC Diagnoses   Final diagnoses:  Closed nondisplaced fracture of left calcaneus, unspecified portion of calcaneus, initial encounter     Discharge Instructions     Wear ace wrap.  Avoid all weight bearing; use crutches.  Elevate leg/foot.  Apply ice pack for 20 to 30 minutes, 3 to 4 times daily  Continue until pain and swelling decrease.      ED Prescriptions    Medication Sig Dispense Auth. Provider   traMADol (ULTRAM) 50 MG tablet Take 1 tablet (50 mg total) by mouth every 6 (six) hours as needed for up  to 5 days for moderate pain or severe pain. 16 tablet Rodney Langton, MD        Lattie Haw, MD 12/18/19 1106

## 2019-12-19 ENCOUNTER — Other Ambulatory Visit: Payer: Self-pay

## 2019-12-19 ENCOUNTER — Ambulatory Visit (INDEPENDENT_AMBULATORY_CARE_PROVIDER_SITE_OTHER): Payer: BC Managed Care – PPO

## 2019-12-19 ENCOUNTER — Ambulatory Visit (INDEPENDENT_AMBULATORY_CARE_PROVIDER_SITE_OTHER): Payer: BC Managed Care – PPO | Admitting: Sports Medicine

## 2019-12-19 ENCOUNTER — Encounter: Payer: Self-pay | Admitting: Certified Nurse Midwife

## 2019-12-19 DIAGNOSIS — S92002A Unspecified fracture of left calcaneus, initial encounter for closed fracture: Secondary | ICD-10-CM | POA: Insufficient documentation

## 2019-12-19 DIAGNOSIS — S99912A Unspecified injury of left ankle, initial encounter: Secondary | ICD-10-CM

## 2019-12-19 MED ORDER — HYDROCODONE-ACETAMINOPHEN 10-325 MG PO TABS: 1.0000 | ORAL_TABLET | Freq: Three times a day (TID) | ORAL | 0 refills | Status: DC | PRN

## 2019-12-19 MED ORDER — MELOXICAM 15 MG PO TABS: ORAL_TABLET | ORAL | 3 refills | Status: DC

## 2019-12-19 MED ORDER — AMBULATORY NON FORMULARY MEDICATION: 0 refills | Status: DC

## 2019-12-19 NOTE — Assessment & Plan Note (Addendum)
This pleasant 39 year old female was at a trampoline park this weekend, her foot got caught and she fell, she had immediate pain, swelling, bruising and inability to bear weight. She was seen in urgent care where x-rays showed a potential fracture at the lateral calcaneus. She has a fairly severe joint effusion, pain is severe, she is unable to bear weight, I am concerned for significant multiligamentous injury and injury to the articular surface so I think we do need an MRI sooner rather than later. She has tenderness over all bony structures, ligament structures, and the ankle exam is somewhat limited by pain, though I can feel some instability. Until then continue boot, nonweightbearing with crutches, meloxicam, stop tramadol, starting high-dose Vicodin. Return to see me in 2 weeks.

## 2019-12-19 NOTE — Progress Notes (Signed)
    Procedures performed today:    None.  Independent interpretation of notes and tests performed by another provider:   I personally reviewed the x-rays, there does appear to be a cortical irregularity in the lateral calcaneus.  Impression and Recommendations:    Left ankle injury This pleasant 39 year old female was at a trampoline park this weekend, her foot got caught and she fell, she had immediate pain, swelling, bruising and inability to bear weight. She was seen in urgent care where x-rays showed a potential fracture at the lateral calcaneus. She has a fairly severe joint effusion, pain is severe, she is unable to bear weight, I am concerned for significant multiligamentous injury and injury to the articular surface so I think we do need an MRI sooner rather than later. She has tenderness over all bony structures, ligament structures, and the ankle exam is somewhat limited by pain, though I can feel some instability. Until then continue boot, nonweightbearing with crutches, meloxicam, stop tramadol, starting high-dose Vicodin. Return to see me in 2 weeks.    ___________________________________________ Ihor Austin. Benjamin Stain, M.D., ABFM., CAQSM. Primary Care and Sports Medicine [facility]  Adjunct Instructor of Family Medicine  [employer]

## 2019-12-20 ENCOUNTER — Encounter: Payer: BC Managed Care – PPO | Admitting: Certified Nurse Midwife

## 2019-12-20 ENCOUNTER — Ambulatory Visit: Payer: BC Managed Care – PPO

## 2019-12-20 DIAGNOSIS — S99912D Unspecified injury of left ankle, subsequent encounter: Secondary | ICD-10-CM

## 2019-12-20 MED ORDER — HYDROMORPHONE HCL 4 MG PO TABS: 4.0000 mg | ORAL_TABLET | Freq: Three times a day (TID) | ORAL | 0 refills | Status: DC | PRN

## 2019-12-21 DIAGNOSIS — S99912D Unspecified injury of left ankle, subsequent encounter: Secondary | ICD-10-CM

## 2019-12-23 MED ORDER — HYDROMORPHONE HCL 8 MG PO TABS: 8.0000 mg | ORAL_TABLET | Freq: Three times a day (TID) | ORAL | 0 refills | Status: DC | PRN

## 2020-01-02 ENCOUNTER — Other Ambulatory Visit: Payer: Self-pay

## 2020-01-02 ENCOUNTER — Ambulatory Visit (INDEPENDENT_AMBULATORY_CARE_PROVIDER_SITE_OTHER): Payer: BC Managed Care – PPO | Admitting: Sports Medicine

## 2020-01-02 DIAGNOSIS — S99912A Unspecified injury of left ankle, initial encounter: Secondary | ICD-10-CM

## 2020-01-02 DIAGNOSIS — S92015D Nondisplaced fracture of body of left calcaneus, subsequent encounter for fracture with routine healing: Secondary | ICD-10-CM | POA: Diagnosis not present

## 2020-01-02 MED ORDER — HYDROCODONE-ACETAMINOPHEN 10-325 MG PO TABS: 1.0000 | ORAL_TABLET | Freq: Three times a day (TID) | ORAL | 0 refills | Status: DC | PRN

## 2020-01-02 NOTE — Progress Notes (Signed)
    Procedures performed today:    None.  Independent interpretation of notes and tests performed by another provider:   None.  Impression and Recommendations:    Fracture of calcaneus, left, closed Rayanna returns, she has a fairly extensive calcaneal fracture without displacement. She had initial severe pain, ultimately requiring 8 mg of hydromorphone. Things are improving considerably so we are going to refill hydrocodone high-dose. Continue nonweightbearing with crutches for at least another month, I did explain that this can take 2 to 3 months to fully heal. At a follow-up visit we may pull the trigger for repeat advanced imaging but this time in the form of a CT.    ___________________________________________ Ihor Austin. Benjamin Stain, M.D., ABFM., CAQSM. Primary Care and Sports Medicine [facility]  Adjunct Instructor of Family Medicine  [employer]

## 2020-01-02 NOTE — Assessment & Plan Note (Signed)
Linda [name] returns, she has a fairly extensive calcaneal fracture without displacement. She had initial severe pain, ultimately requiring 8 mg of hydromorphone. Things are improving considerably so we are going to refill hydrocodone high-dose. Continue nonweightbearing with crutches for at least another month, I did explain that this can take 2 to 3 months to fully heal. At a follow-up visit we may pull the trigger for repeat advanced imaging but this time in the form of a CT.

## 2020-01-03 ENCOUNTER — Ambulatory Visit: Payer: BC Managed Care – PPO | Admitting: Medical-Surgical

## 2020-01-03 ENCOUNTER — Ambulatory Visit (INDEPENDENT_AMBULATORY_CARE_PROVIDER_SITE_OTHER): Payer: BC Managed Care – PPO | Admitting: Medical-Surgical

## 2020-01-03 ENCOUNTER — Encounter: Payer: Self-pay | Admitting: Medical-Surgical

## 2020-01-03 VITALS — BP 146/107 | HR 104 | Temp 97.8°F | Ht 65.5 in | Wt 218.0 lb

## 2020-01-03 DIAGNOSIS — F411 Generalized anxiety disorder: Secondary | ICD-10-CM | POA: Diagnosis not present

## 2020-01-03 DIAGNOSIS — G43109 Migraine with aura, not intractable, without status migrainosus: Secondary | ICD-10-CM

## 2020-01-03 DIAGNOSIS — I10 Essential (primary) hypertension: Secondary | ICD-10-CM | POA: Diagnosis not present

## 2020-01-03 DIAGNOSIS — K219 Gastro-esophageal reflux disease without esophagitis: Secondary | ICD-10-CM

## 2020-01-03 DIAGNOSIS — Z6834 Body mass index (BMI) 34.0-34.9, adult: Secondary | ICD-10-CM

## 2020-01-03 MED ORDER — PHENTERMINE-TOPIRAMATE ER 3.75-23 MG PO CP24: 1.0000 | ORAL_CAPSULE | Freq: Every day | ORAL | 0 refills | Status: DC

## 2020-01-03 MED ORDER — PHENTERMINE-TOPIRAMATE ER 7.5-46 MG PO CP24: 1.0000 | ORAL_CAPSULE | Freq: Every day | ORAL | 0 refills | Status: DC

## 2020-01-03 MED ORDER — LOSARTAN POTASSIUM 25 MG PO TABS: 25.0000 mg | ORAL_TABLET | Freq: Every day | ORAL | 1 refills | Status: DC

## 2020-01-03 MED ORDER — PANTOPRAZOLE SODIUM 40 MG PO TBEC: 40.0000 mg | DELAYED_RELEASE_TABLET | Freq: Every day | ORAL | 3 refills | Status: DC

## 2020-01-03 MED ORDER — ESCITALOPRAM OXALATE 20 MG PO TABS: 20.0000 mg | ORAL_TABLET | Freq: Every day | ORAL | 3 refills | Status: DC

## 2020-01-03 NOTE — Patient Instructions (Addendum)
Increase Lexapro to 20mg  daily  Take Protonix 40mg  once daily for heartburn  Losartan 25mg  once daily for blood pressure. Check BP and keep a log. I will send you a message in 2 weeks to see how it is going.  Starting Phentermine-topiramate: take 3.75-23mg  daily x 14 days then start the higher dose daily for 12 weeks.

## 2020-01-03 NOTE — Progress Notes (Signed)
Subjective:    CC: HTN and GAD follow up  HPI:  HTN-was previously prescribed lisinopril-HCTZ but developed a hacking cough while taking this medication.  Is not currently taking any antihypertensive medication.  Was monitoring home blood pressures until she broke her left foot 2 weeks ago.  After that, she was on pain medicine regularly and did not check her blood pressure.  Denies chest pain, shortness of breath, palpitations, lower extremity swelling, dizziness.  GAD- Taking Lexapro 10mg  daily.  Reports that she is tolerating this medication well without side effects.  Feels this medication takes the edge off but she does not notice any leaps and bounds difference.  No SI/HI.  Declined counseling at last visit.  Has Klonopin 0.5 mg at home but reports she is not taking this as she is usually driving with her children in the car when she experiences anxiety and does not want to drive impaired.  Weight gain-still very concerned about weight gain, especially since she has injured her foot and will be limited in her ability to exercise for the next couple of months.   Migraines with aura-approximately 1 severe headache days per month.  Reports this is improved since the beginning of the year she is in a new job with lower stress.  Does note some rebound headaches in the last 2 weeks from use of narcotic pain medications.  Previously took Topamax for migraine prophylaxis.  GERD-long history of heartburn especially when eating spicy foods.  Previously taking Zantac but this was recalled.  Is currently  taking over-the-counter antacids but these do not work as well.   I reviewed the past medical history, family history, social history, surgical history, and allergies today and no changes were needed.  Please see the problem list section below in epic for further details.  Past Medical History: Past Medical History:  Diagnosis Date  . Anxiety   . Depression   . DVT (deep venous thrombosis) ([place])    . Hypertension   . Migraine   . Seizures ([place])   . Stroke [facility])    Past Surgical History: Past Surgical History:  Procedure Laterality Date  . CARPAL TUNNEL RELEASE    . CESAREAN SECTION    . INTERSTIM IMPLANT PLACEMENT    . tenze    . TUBAL LIGATION     Social History: Social History   Socioeconomic History  . Marital status: Single    Spouse name: Not on file  . Number of children: Not on file  . Years of education: Not on file  . Highest education level: Not on file  Occupational History  . Not on file  Tobacco Use  . Smoking status: Former Research scientist (life sciences)  . Smokeless tobacco: Never Used  Substance and Sexual Activity  . Alcohol use: Yes    Alcohol/week: 3.0 standard drinks    Types: 3 Glasses of wine per week  . Drug use: Never  . Sexual activity: Yes    Partners: Male    Birth control/protection: Surgical  Other Topics Concern  . Not on file  Social History Narrative  . Not on file   Social Determinants of Health   Financial Resource Strain:   . Difficulty of Paying Living Expenses:   Food Insecurity:   . Worried About Charity fundraiser in the Last Year:   . Arboriculturist in the Last Year:   Transportation Needs:   . Film/video editor (Medical):   Marland Kitchen Lack of Transportation (Non-Medical):  Physical Activity:   . Days of Exercise per Week:   . Minutes of Exercise per Session:   Stress:   . Feeling of Stress :   Social Connections:   . Frequency of Communication with Friends and Family:   . Frequency of Social Gatherings with Friends and Family:   . Attends Religious Services:   . Active Member of Clubs or Organizations:   . Attends Banker Meetings:   Marland Kitchen Marital Status:    Family History: Family History  Problem Relation Age of Onset  . Hypertension Mother   . Hypertension Father   . Clotting disorder Father   . Diabetes Father   . Deep vein thrombosis Father   . Breast cancer Maternal Aunt   . Factor V Leiden deficiency  Maternal Aunt   . Deep vein thrombosis Paternal Aunt   . Stroke Maternal Grandmother   . Prostate cancer Maternal Grandfather   . Stroke Paternal Grandfather   . Colon cancer Paternal Grandfather   . Breast cancer Maternal Great-grandmother    Allergies: Allergies  Allergen Reactions  . Chocolate Anaphylaxis  . Cocoa Butter Anaphylaxis  . Codeine Hives and Itching  . Propoxyphene Itching  . Sulfa Antibiotics     Yeast infection   Medications: See med rec.  Review of Systems: No fevers, chills, night sweats, weight loss, chest pain, or shortness of breath.   Objective:    General: Well Developed, well nourished, and in no acute distress.  Neuro: Alert and oriented x3.  HEENT: Normocephalic, atraumatic.  Skin: Warm and dry. Cardiac: Regular rate and rhythm, no murmurs rubs or gallops, no lower extremity edema.  Respiratory: Clear to auscultation bilaterally. Not using accessory muscles, speaking in full sentences. Abdomen: Soft, nondistended, nontender.  Bowel sounds positive x4 quadrants.   Impression and Recommendations:    1. Essential hypertension Discontinue lisinopril-HCTZ, will add lisinopril to her intolerance list.  Blood pressure recheck at end of appointment 142/97, pulse 103.  Start losartan 25 mg daily.  Resume monitoring blood pressures daily and keep a log.  I will reach out to her in 2 weeks to evaluate blood pressures and potential need for increase of losartan dose.  - losartan (COZAAR) 25 MG tablet; Take 1 tablet (25 mg total) by mouth daily.  Dispense: 30 tablet; Refill: 1  2. GAD (generalized anxiety disorder) Increasing Lexapro to 20 mg daily. - escitalopram (LEXAPRO) 20 MG tablet; Take 1 tablet (20 mg total) by mouth daily.  Dispense: 90 tablet; Refill: 3  3. Migraine with aura and without status migrainosus, not intractable Prescribing a combination weight loss pill that includes Topamax in hopes that this will help with migraine prevention.  4. BMI  34.0-34.9,adult Reviewed weight loss options available.  Given dual desire of weight loss and migraine prophylaxis, choosing phentermine-topiramate.  Instructions provided to start on 3.75-23 mg dose daily x14 days then increase to 7.5-46 mg daily for 12 weeks.  Reviewed dietary modifications including portion control, calorie counting, and substitution with healthier options.  Also reviewed weight loss expectation of 5 to 10% after 3 months of therapy. - Phentermine-Topiramate 3.75-23 MG CP24; Take 1 tablet by mouth daily.  Dispense: 14 capsule; Refill: 0 - Phentermine-Topiramate 7.5-46 MG CP24; Take 1 tablet by mouth daily.  Dispense: 90 capsule; Refill: 0  5. Gastroesophageal reflux disease without esophagitis We will try Protonix 40 mg daily to see if this helps with her heartburn symptoms. - pantoprazole (PROTONIX) 40 MG tablet; Take 1 tablet (40  mg total) by mouth daily.  Dispense: 30 tablet; Refill: 3  Return in about 4 weeks (around 01/31/2020) for weight check, blood pressure follow up.  ___________________________________________ Thayer Ohm, DNP, APRN, FNP-BC Primary Care and Sports Medicine [facility] [place]

## 2020-01-06 NOTE — Telephone Encounter (Signed)
Have you received PA request for this?

## 2020-01-16 DIAGNOSIS — Z86718 Personal history of other venous thrombosis and embolism: Secondary | ICD-10-CM

## 2020-01-16 NOTE — Assessment & Plan Note (Signed)
History of DVT in the lower extremity, she has had a left calcaneal fracture and has been immobilized, now having increasing swelling after partial improvement, adding lower extremity Dopplers stat.

## 2020-01-17 ENCOUNTER — Telehealth: Payer: Self-pay | Admitting: Medical-Surgical

## 2020-01-17 ENCOUNTER — Other Ambulatory Visit: Payer: Self-pay | Admitting: Medical-Surgical

## 2020-01-17 ENCOUNTER — Other Ambulatory Visit: Payer: Self-pay

## 2020-01-17 ENCOUNTER — Encounter: Payer: Self-pay | Admitting: Medical-Surgical

## 2020-01-17 ENCOUNTER — Ambulatory Visit: Payer: BC Managed Care – PPO

## 2020-01-17 DIAGNOSIS — R600 Localized edema: Secondary | ICD-10-CM | POA: Diagnosis not present

## 2020-01-17 DIAGNOSIS — N939 Abnormal uterine and vaginal bleeding, unspecified: Secondary | ICD-10-CM

## 2020-01-17 DIAGNOSIS — I10 Essential (primary) hypertension: Secondary | ICD-10-CM

## 2020-01-17 MED ORDER — LOSARTAN POTASSIUM 50 MG PO TABS: 25.0000 mg | ORAL_TABLET | Freq: Every day | ORAL | 1 refills | Status: DC

## 2020-01-17 NOTE — Progress Notes (Signed)
BP still elevated (see patient message with attachment of home readings). Increasing Losartan to 50mg  daily. Will recheck at follow up scheduled on 02/01/2020.

## 2020-01-17 NOTE — Telephone Encounter (Signed)
Went ahead and did PA sent through cover my meds - CF

## 2020-01-17 NOTE — Telephone Encounter (Signed)
Received message from patient that medication cost was expensive I did a PA on medication sent through cover my meds and I am waiting on determination. - CF

## 2020-01-18 ENCOUNTER — Ambulatory Visit (INDEPENDENT_AMBULATORY_CARE_PROVIDER_SITE_OTHER): Payer: BC Managed Care – PPO

## 2020-01-18 ENCOUNTER — Ambulatory Visit (INDEPENDENT_AMBULATORY_CARE_PROVIDER_SITE_OTHER): Payer: BC Managed Care – PPO | Admitting: Sports Medicine

## 2020-01-18 ENCOUNTER — Encounter: Payer: Self-pay | Admitting: Sports Medicine

## 2020-01-18 ENCOUNTER — Other Ambulatory Visit: Payer: Self-pay | Admitting: Sports Medicine

## 2020-01-18 DIAGNOSIS — S92015D Nondisplaced fracture of body of left calcaneus, subsequent encounter for fracture with routine healing: Secondary | ICD-10-CM | POA: Diagnosis not present

## 2020-01-18 DIAGNOSIS — M25472 Effusion, left ankle: Secondary | ICD-10-CM | POA: Diagnosis not present

## 2020-01-18 DIAGNOSIS — M545 Low back pain: Secondary | ICD-10-CM | POA: Diagnosis not present

## 2020-01-18 MED ORDER — GABAPENTIN 300 MG PO CAPS: ORAL_CAPSULE | ORAL | 3 refills | Status: DC

## 2020-01-18 NOTE — Progress Notes (Signed)
xr os calcis ordered to be done prior to appt

## 2020-01-18 NOTE — Assessment & Plan Note (Signed)
This pleasant 39 year old female sustained a severe calcaneal fracture, ultimately her pain has improved considerably with regards to her calcaneal injury. Unfortunately she started to have a burning pain in the back of her left calf. We added a stat DVT ultrasound that was negative. In the office she tells me the burning sensation runs down the back of her calf to the outer 2 toes consistent with an S1 radiculitis. This is likely related to her abnormal biomechanics in her boot and knee scooter. Adding x-rays of her lumbar spine, as well as gabapentin. Keep previously agreed upon follow-up.

## 2020-01-18 NOTE — Progress Notes (Signed)
    Procedures performed today:    None.  Independent interpretation of notes and tests performed by another provider:   None.  Brief History, Exam, Impression, and Recommendations:    Fracture of calcaneus, left, closed This pleasant 39 year old female sustained a severe calcaneal fracture, ultimately her pain has improved considerably with regards to her calcaneal injury. Unfortunately she started to have a burning pain in the back of her left calf. We added a stat DVT ultrasound that was negative. In the office she tells me the burning sensation runs down the back of her calf to the outer 2 toes consistent with an S1 radiculitis. This is likely related to her abnormal biomechanics in her boot and knee scooter. Adding x-rays of her lumbar spine, as well as gabapentin. Keep previously agreed upon follow-up.    ___________________________________________ Ihor Austin. Benjamin Stain, M.D., ABFM., CAQSM. Primary Care and Sports Medicine [facility]  Adjunct Instructor of Family Medicine  [employer]

## 2020-01-23 ENCOUNTER — Ambulatory Visit: Payer: BC Managed Care – PPO | Admitting: Sports Medicine

## 2020-02-01 ENCOUNTER — Encounter: Payer: Self-pay | Admitting: Medical-Surgical

## 2020-02-01 ENCOUNTER — Other Ambulatory Visit: Payer: Self-pay

## 2020-02-01 ENCOUNTER — Ambulatory Visit (INDEPENDENT_AMBULATORY_CARE_PROVIDER_SITE_OTHER): Payer: BC Managed Care – PPO

## 2020-02-01 ENCOUNTER — Ambulatory Visit (INDEPENDENT_AMBULATORY_CARE_PROVIDER_SITE_OTHER): Payer: BC Managed Care – PPO | Admitting: Sports Medicine

## 2020-02-01 ENCOUNTER — Ambulatory Visit (INDEPENDENT_AMBULATORY_CARE_PROVIDER_SITE_OTHER): Payer: BC Managed Care – PPO | Admitting: Medical-Surgical

## 2020-02-01 VITALS — BP 129/88 | HR 99 | Temp 98.5°F | Ht 65.5 in | Wt 217.3 lb

## 2020-02-01 DIAGNOSIS — Z6834 Body mass index (BMI) 34.0-34.9, adult: Secondary | ICD-10-CM

## 2020-02-01 DIAGNOSIS — S92015D Nondisplaced fracture of body of left calcaneus, subsequent encounter for fracture with routine healing: Secondary | ICD-10-CM | POA: Diagnosis not present

## 2020-02-01 DIAGNOSIS — S92902D Unspecified fracture of left foot, subsequent encounter for fracture with routine healing: Secondary | ICD-10-CM

## 2020-02-01 DIAGNOSIS — G43109 Migraine with aura, not intractable, without status migrainosus: Secondary | ICD-10-CM

## 2020-02-01 DIAGNOSIS — S92062A Displaced intraarticular fracture of left calcaneus, initial encounter for closed fracture: Secondary | ICD-10-CM | POA: Diagnosis not present

## 2020-02-01 MED ORDER — PHENTERMINE HCL 8 MG PO TABS: 8.0000 mg | ORAL_TABLET | Freq: Every day | ORAL | 0 refills | Status: DC

## 2020-02-01 MED ORDER — TOPIRAMATE 100 MG PO TABS: 100.0000 mg | ORAL_TABLET | Freq: Every day | ORAL | 1 refills | Status: DC

## 2020-02-01 NOTE — Assessment & Plan Note (Signed)
This is a pleasant 39 year old female, she is just 1-3/4 months post a fairly severe calcaneal fracture, she is continuing to do significantly better. She has done well in the boot, she is off of all of her painkillers, she is off of gabapentin. I think today we are going to proceed with a CT of the calcaneus to ensure healing, if I do see good bony bridging then I think it would be okay for her to weight-bear as tolerated and start physical therapy. She should plan to see me back in about a month.

## 2020-02-01 NOTE — Progress Notes (Signed)
Subjective:    CC: Weight check, hypertension follow-up  HPI: Pleasant 39 year old female presenting today for a weight check and hypertension follow-up.  Weight check-taking Qsymia for the past 3 weeks.  Has been on the 7.5/46 mg dose for approximately 1 week.  Has noticed an increase in migraines days to approximately 1/week since starting the medication.  Eating portion control snacks.  Eats breakfast followed by a light lunch (usually a salad) and then a healthy weight watchers type dinner.  Reports that she does not feel hungry and she does not eat after 7 PM.  Drinking water and has given up coffee and sodas.  Does endorse continuing 1 glass of wine per night on average.  Continues to be nonweightbearing to her left foot which is limiting her ability to do increase her activity.  Would like to continue the medication but would like a higher dose of Topamax to help manage migraines.  Prior migraine prophylaxis with Topamax 200 mg a day several years ago.  Hypertension-taking losartan 50 mg daily.  Has cut back sodium in her diet.  Denies chest pain, shortness of breath, lower extremity edema, headaches, dizziness, and palpitations.  I reviewed the past medical history, family history, social history, surgical history, and allergies today and no changes were needed.  Please see the problem list section below in epic for further details.  Past Medical History: Past Medical History:  Diagnosis Date  . Anxiety   . Depression   . DVT (deep venous thrombosis) ([place])   . Hypertension   . Migraine   . Seizures ([place])   . Stroke [facility])    Past Surgical History: Past Surgical History:  Procedure Laterality Date  . CARPAL TUNNEL RELEASE    . CESAREAN SECTION    . INTERSTIM IMPLANT PLACEMENT    . tenze    . TUBAL LIGATION     Social History: Social History   Socioeconomic History  . Marital status: Single    Spouse name: Not on file  . Number of children: Not on file  . Years of  education: Not on file  . Highest education level: Not on file  Occupational History  . Not on file  Tobacco Use  . Smoking status: Former Research scientist (life sciences)  . Smokeless tobacco: Never Used  Substance and Sexual Activity  . Alcohol use: Yes    Alcohol/week: 3.0 standard drinks    Types: 3 Glasses of wine per week  . Drug use: Never  . Sexual activity: Yes    Partners: Male    Birth control/protection: Surgical  Other Topics Concern  . Not on file  Social History Narrative  . Not on file   Social Determinants of Health   Financial Resource Strain:   . Difficulty of Paying Living Expenses:   Food Insecurity:   . Worried About Charity fundraiser in the Last Year:   . Arboriculturist in the Last Year:   Transportation Needs:   . Film/video editor (Medical):   Marland Kitchen Lack of Transportation (Non-Medical):   Physical Activity:   . Days of Exercise per Week:   . Minutes of Exercise per Session:   Stress:   . Feeling of Stress :   Social Connections:   . Frequency of Communication with Friends and Family:   . Frequency of Social Gatherings with Friends and Family:   . Attends Religious Services:   . Active Member of Clubs or Organizations:   . Attends Archivist  Meetings:   Marland Kitchen Marital Status:    Family History: Family History  Problem Relation Age of Onset  . Hypertension Mother   . Hypertension Father   . Clotting disorder Father   . Diabetes Father   . Deep vein thrombosis Father   . Breast cancer Maternal Aunt   . Factor V Leiden deficiency Maternal Aunt   . Deep vein thrombosis Paternal Aunt   . Stroke Maternal Grandmother   . Prostate cancer Maternal Grandfather   . Stroke Paternal Grandfather   . Colon cancer Paternal Grandfather   . Breast cancer Maternal Great-grandmother    Allergies: Allergies  Allergen Reactions  . Chocolate Anaphylaxis  . Cocoa Butter Anaphylaxis  . Codeine Hives and Itching  . Propoxyphene Itching  . Sulfa Antibiotics     Yeast  infection  . Lisinopril Cough   Medications: See med rec.  Review of Systems: No fevers, chills, night sweats, weight loss, chest pain, or shortness of breath.   Objective:    General: Well Developed, well nourished, and in no acute distress.  Neuro: Alert and oriented x3.  HEENT: Normocephalic, atraumatic.  Skin: Warm and dry. Cardiac: Regular rate and rhythm, no murmurs rubs or gallops, no lower extremity edema.  Respiratory: Clear to auscultation bilaterally. Not using accessory muscles, speaking in full sentences.   Impression and Recommendations:    Weight check, BMI 35.61 Approximately 1 pound weight loss in the past 3 weeks.  Weight loss efforts limited by nonweightbearing status for left calcaneal fracture.  Pending CT results, may be able to resume weightbearing to the left foot and increase her activity.  Discussed weight loss expectations while on the phentermine and Topamax.  Due to difficulty with insurance coverage and expense, prescribing separately.  Increasing Topamax dose to 100 mg daily.  Hypertension In office today 129/88.  Continue losartan 50 mg daily and sodium restriction.  Return in about 4 weeks (around 02/29/2020) for Weight check.  ___________________________________________ Thayer Ohm, DNP, APRN, FNP-BC Primary Care and Sports Medicine [facility] [place]

## 2020-02-01 NOTE — Progress Notes (Signed)
    Procedures performed today:    None.  Independent interpretation of notes and tests performed by another provider:   None.  Brief History, Exam, Impression, and Recommendations:    Fracture of calcaneus, left, closed This is a pleasant 39 year old female, she is just 1-3/4 months post a fairly severe calcaneal fracture, she is continuing to do significantly better. She has done well in the boot, she is off of all of her painkillers, she is off of gabapentin. I think today we are going to proceed with a CT of the calcaneus to ensure healing, if I do see good bony bridging then I think it would be okay for her to weight-bear as tolerated and start physical therapy. She should plan to see me back in about a month.    ___________________________________________ Ihor Austin. Benjamin Stain, M.D., ABFM., CAQSM. Primary Care and Sports Medicine [facility]  Adjunct Instructor of Family Medicine  [employer]

## 2020-02-02 NOTE — Telephone Encounter (Signed)
Routing to provider  

## 2020-02-28 NOTE — Progress Notes (Signed)
Subjective:    CC: weight check  HPI: Pleasant 39 year old female presenting today for weight check on phentermine and topamax. Taking the medications as prescribed, no side effects. Reports decreased appetite with conscious effort to eat healthier. Has not been binge eating. Recently increased activity after clearance from Dr. Karie Schwalbe. Now walking for exercise and doing upper extremity resistance band exercises. 10lb weight loss in the last 4 weeks.  HTN- BP a bit elevated in office today. Not checking BP at home regularly but does have a cuff. Taking Losartan 25mg  daily as prescribed. Limiting dietary sodium.   Anxiety- Notes that she has been more emotional and sensitive lately. Unsure whether it is related to the increased dose of Lexapro, she is just reacting to increased stress, or if there is some hormonal imbalance (experiencing hot flashes). Taking Klonopin 0.5mg  as needed, average use 1/week.  Migraines- notes headaches have been much better on the higher dose of Topamax.   I reviewed the past medical history, family history, social history, surgical history, and allergies today and no changes were needed.  Please see the problem list section below in epic for further details.  Past Medical History: Past Medical History:  Diagnosis Date  . Anxiety   . Depression   . DVT (deep venous thrombosis) ([facility])   . Hypertension   . Migraine   . Seizures ([facility])   . Stroke [facility])    Past Surgical History: Past Surgical History:  Procedure Laterality Date  . CARPAL TUNNEL RELEASE    . CESAREAN SECTION    . INTERSTIM IMPLANT PLACEMENT    . tenze    . TUBAL LIGATION     Social History: Social History   Socioeconomic History  . Marital status: Single    Spouse name: Not on file  . Number of children: Not on file  . Years of education: Not on file  . Highest education level: Not on file  Occupational History  . Not on file  Tobacco Use  . Smoking status: Former [facility]  . Smokeless  tobacco: Never Used  Substance and Sexual Activity  . Alcohol use: Yes    Alcohol/week: 3.0 standard drinks    Types: 3 Glasses of wine per week  . Drug use: Never  . Sexual activity: Yes    Partners: Male    Birth control/protection: Surgical  Other Topics Concern  . Not on file  Social History Narrative  . Not on file   Social Determinants of Health   Financial Resource Strain:   . Difficulty of Paying Living Expenses:   Food Insecurity:   . Worried About Games developer in the Last Year:   . Programme researcher, broadcasting/film/video in the Last Year:   Transportation Needs:   . Barista (Medical):   Freight forwarder Lack of Transportation (Non-Medical):   Physical Activity:   . Days of Exercise per Week:   . Minutes of Exercise per Session:   Stress:   . Feeling of Stress :   Social Connections:   . Frequency of Communication with Friends and Family:   . Frequency of Social Gatherings with Friends and Family:   . Attends Religious Services:   . Active Member of Clubs or Organizations:   . Attends Marland Kitchen Meetings:   Banker Marital Status:    Family History: Family History  Problem Relation Age of Onset  . Hypertension Mother   . Hypertension Father   . Clotting disorder Father   .  Diabetes Father   . Deep vein thrombosis Father   . Breast cancer Maternal Aunt   . Factor V Leiden deficiency Maternal Aunt   . Deep vein thrombosis Paternal Aunt   . Stroke Maternal Grandmother   . Prostate cancer Maternal Grandfather   . Stroke Paternal Grandfather   . Colon cancer Paternal Grandfather   . Breast cancer Maternal Great-grandmother    Allergies: Allergies  Allergen Reactions  . Chocolate Anaphylaxis  . Cocoa Butter Anaphylaxis  . Codeine Hives and Itching  . Propoxyphene Itching  . Sulfa Antibiotics     Yeast infection  . Lisinopril Cough   Medications: See med rec.  Review of Systems: No fevers, chills, night sweats, weight loss, chest pain, or shortness of breath.    Objective:    General: Well Developed, well nourished, and in no acute distress.  Neuro: Alert and oriented x3.  HEENT: Normocephalic, atraumatic.  Skin: Warm and dry. Cardiac: Regular rate and rhythm, no murmurs rubs or gallops, no lower extremity edema.  Respiratory: Clear to auscultation bilaterally. Not using accessory muscles, speaking in full sentences.   Impression and Recommendations:    1. BMI 34.0-34.9,adult Excellent weight loss of 10lbs over the past 4 weeks. Continue Phentermine and Topamax at current dosages. Continue increased exercise and dietary modifications. - Phentermine HCl 8 MG TABS; Take 8 mg by mouth daily.  Dispense: 28 tablet; Refill: 0 - topiramate (TOPAMAX) 100 MG tablet; Take 1 tablet (100 mg total) by mouth daily.  Dispense: 90 tablet; Refill: 1  2. Migraine with aura and without status migrainosus, not intractable Stable on Topamax 100mg  daily. - topiramate (TOPAMAX) 100 MG tablet; Take 1 tablet (100 mg total) by mouth daily.  Dispense: 90 tablet; Refill: 1  3. GAD (generalized anxiety disorder) Consider Lexapro dosage change but would like to evaluate for other causes first. Checking labs today including TSH. For now, continue Lexapro 20mg  daily and sparing use of prn Klonopin. - TSH - clonazePAM (KLONOPIN) 0.5 MG tablet; Take 1 tablet (0.5 mg total) by mouth 2 (two) times daily as needed.  Dispense: 30 tablet; Refill: 0  4. Essential hypertension Checking CBC and CMP. Continue Losartan 25mg  as prescribed. Advised to start monitoring BP at home several times weekly. If readings remain elevated above 130/80, will need to adjust medication. - CBC - COMPLETE METABOLIC PANEL WITH GFR  5. Hot flashes Checking FSH/LH today. - FSH/LH  Return in about 4 weeks (around 03/28/2020) for weight check. ___________________________________________ Clearnce Sorrel, DNP, APRN, FNP-BC Primary Care and [place]

## 2020-02-29 ENCOUNTER — Ambulatory Visit (INDEPENDENT_AMBULATORY_CARE_PROVIDER_SITE_OTHER): Payer: BC Managed Care – PPO | Admitting: Medical-Surgical

## 2020-02-29 ENCOUNTER — Other Ambulatory Visit: Payer: Self-pay

## 2020-02-29 ENCOUNTER — Ambulatory Visit (INDEPENDENT_AMBULATORY_CARE_PROVIDER_SITE_OTHER): Payer: BC Managed Care – PPO | Admitting: Sports Medicine

## 2020-02-29 ENCOUNTER — Encounter: Payer: Self-pay | Admitting: Medical-Surgical

## 2020-02-29 VITALS — BP 143/90 | HR 112 | Temp 98.2°F | Ht 65.5 in | Wt 206.9 lb

## 2020-02-29 DIAGNOSIS — G43109 Migraine with aura, not intractable, without status migrainosus: Secondary | ICD-10-CM | POA: Diagnosis not present

## 2020-02-29 DIAGNOSIS — S92015D Nondisplaced fracture of body of left calcaneus, subsequent encounter for fracture with routine healing: Secondary | ICD-10-CM | POA: Diagnosis not present

## 2020-02-29 DIAGNOSIS — I10 Essential (primary) hypertension: Secondary | ICD-10-CM | POA: Diagnosis not present

## 2020-02-29 DIAGNOSIS — R232 Flushing: Secondary | ICD-10-CM

## 2020-02-29 DIAGNOSIS — Z114 Encounter for screening for human immunodeficiency virus [HIV]: Secondary | ICD-10-CM

## 2020-02-29 DIAGNOSIS — F411 Generalized anxiety disorder: Secondary | ICD-10-CM

## 2020-02-29 DIAGNOSIS — Z6834 Body mass index (BMI) 34.0-34.9, adult: Secondary | ICD-10-CM

## 2020-02-29 LAB — COMPLETE METABOLIC PANEL WITH GFR
AG Ratio: 1.6 (calc) (ref 1.0–2.5)
ALT: 60 U/L — ABNORMAL HIGH (ref 6–29)
AST: 46 U/L — ABNORMAL HIGH (ref 10–30)
Albumin: 4.4 g/dL (ref 3.6–5.1)
Alkaline phosphatase (APISO): 74 U/L (ref 31–125)
BUN: 13 mg/dL (ref 7–25)
CO2: 22 mmol/L (ref 20–32)
Calcium: 9.2 mg/dL (ref 8.6–10.2)
Chloride: 106 mmol/L (ref 98–110)
Creat: 0.79 mg/dL (ref 0.50–1.10)
GFR, Est African American: 111 mL/min/{1.73_m2} (ref >=60)
GFR, Est Non African American: 96 mL/min/{1.73_m2} (ref >=60)
Globulin: 2.7 g/dL (calc) (ref 1.9–3.7)
Glucose, Bld: 97 mg/dL (ref 65–99)
Potassium: 3.8 mmol/L (ref 3.5–5.3)
Sodium: 138 mmol/L (ref 135–146)
Total Bilirubin: 0.6 mg/dL (ref 0.2–1.2)
Total Protein: 7.1 g/dL (ref 6.1–8.1)

## 2020-02-29 LAB — FSH/LH
FSH: 11.8 m[IU]/mL
LH: 3.1 m[IU]/mL

## 2020-02-29 LAB — CBC
HCT: 42.3 % (ref 35.0–45.0)
Hemoglobin: 14.5 g/dL (ref 11.7–15.5)
MCH: 31.2 pg (ref 27.0–33.0)
MCHC: 34.3 g/dL (ref 32.0–36.0)
MCV: 91 fL (ref 80.0–100.0)
MPV: 10.5 fL (ref 7.5–12.5)
Platelets: 298 10*3/uL (ref 140–400)
RBC: 4.65 10*6/uL (ref 3.80–5.10)
RDW: 13.1 % (ref 11.0–15.0)
WBC: 6.4 10*3/uL (ref 3.8–10.8)

## 2020-02-29 LAB — TSH: TSH: 1.99 mIU/L

## 2020-02-29 MED ORDER — CLONAZEPAM 0.5 MG PO TABS: 0.5000 mg | ORAL_TABLET | Freq: Two times a day (BID) | ORAL | 0 refills | Status: DC | PRN

## 2020-02-29 MED ORDER — PHENTERMINE HCL 8 MG PO TABS: 8.0000 mg | ORAL_TABLET | Freq: Every day | ORAL | 0 refills | Status: DC

## 2020-02-29 MED ORDER — TOPIRAMATE 100 MG PO TABS: 100.0000 mg | ORAL_TABLET | Freq: Every day | ORAL | 1 refills | Status: DC

## 2020-02-29 NOTE — Progress Notes (Signed)
    Procedures performed today:    None.  Independent interpretation of notes and tests performed by another provider:   None.  Brief History, Exam, Impression, and Recommendations:    Fracture of calcaneus, left, closed This pleasant 39 year old female returns, she is now 2-3/4 months post a severe comminuted calcaneal fracture. She is for the most part pain-free now, only minimal tenderness with calcaneal squeeze, she can walk without pain, and has been trying without the boot. I think she can do approximately a couple more days without the knee scooter, and then discontinue the boot altogether and wear supportive shoes. At the last visit a CT scan did show signs of healing of her calcaneal fracture. We are also going to start physical therapy, and she can return to see me in about a month.    ___________________________________________ Ihor Austin. Benjamin Stain, M.D., ABFM., CAQSM. Primary Care and Sports Medicine [facility]  Adjunct Instructor of Family Medicine  [employer]

## 2020-02-29 NOTE — Assessment & Plan Note (Signed)
This pleasant 39 year old female returns, she is now 2-3/4 months post a severe comminuted calcaneal fracture. She is for the most part pain-free now, only minimal tenderness with calcaneal squeeze, she can walk without pain, and has been trying without the boot. I think she can do approximately a couple more days without the knee scooter, and then discontinue the boot altogether and wear supportive shoes. At the last visit a CT scan did show signs of healing of her calcaneal fracture. We are also going to start physical therapy, and she can return to see me in about a month.

## 2020-03-01 ENCOUNTER — Other Ambulatory Visit: Payer: Self-pay

## 2020-03-01 DIAGNOSIS — R748 Abnormal levels of other serum enzymes: Secondary | ICD-10-CM

## 2020-03-03 ENCOUNTER — Other Ambulatory Visit: Payer: Self-pay | Admitting: Medical-Surgical

## 2020-03-03 DIAGNOSIS — I10 Essential (primary) hypertension: Secondary | ICD-10-CM

## 2020-03-12 ENCOUNTER — Encounter: Payer: Self-pay | Admitting: Medical-Surgical

## 2020-03-13 ENCOUNTER — Encounter: Payer: Self-pay | Admitting: Physical Therapy

## 2020-03-13 ENCOUNTER — Ambulatory Visit (INDEPENDENT_AMBULATORY_CARE_PROVIDER_SITE_OTHER): Payer: BC Managed Care – PPO | Admitting: Physical Therapy

## 2020-03-13 ENCOUNTER — Other Ambulatory Visit: Payer: Self-pay

## 2020-03-13 DIAGNOSIS — M25572 Pain in left ankle and joints of left foot: Secondary | ICD-10-CM

## 2020-03-13 DIAGNOSIS — R600 Localized edema: Secondary | ICD-10-CM | POA: Diagnosis not present

## 2020-03-13 DIAGNOSIS — M25672 Stiffness of left ankle, not elsewhere classified: Secondary | ICD-10-CM

## 2020-03-13 DIAGNOSIS — M6281 Muscle weakness (generalized): Secondary | ICD-10-CM

## 2020-03-13 NOTE — Therapy (Signed)
[facility] [place] [address] [place] [address] [place], [place], [address] Phone: [phone]   Fax:  [phone]  Physical Therapy Evaluation  Patient Details  Name: Linda [name] MRN: [id number] Date of Birth: 06-30-1981 Referring Provider (PT): Dr. Benjamin Stain   Encounter Date: 03/13/2020  PT End of Session - 03/13/20 0932    Visit Number  1    Date for PT Re-Evaluation  04/24/20    PT Start Time  0932    PT Stop Time  1016    PT Time Calculation (min)  44 min    Activity Tolerance  Patient tolerated treatment well    Behavior During Therapy  [facility] for tasks assessed/performed       Past Medical History:  Diagnosis Date  . Anxiety   . Depression   . DVT (deep venous thrombosis) (HCC)   . Hypertension   . Migraine   . Pap smear abnormality of cervix/human papillomavirus (HPV) positive   . Seizures (HCC)   . Stroke [facility])     Past Surgical History:  Procedure Laterality Date  . CARPAL TUNNEL RELEASE    . CESAREAN SECTION    . INTERSTIM IMPLANT PLACEMENT    . tenze    . TUBAL LIGATION      There were no vitals filed for this visit.   Subjective Assessment - 03/13/20 0934    Subjective  Patient fractured her left calcaneus at the end of February at a trampoline park. She has been in a CAM boot for 3 months and was okayed to come out of it 2 weeks ago. She still is wearing it in the morning for 4 hours when at her work site. It swells badly at night. Hurts to get up on it intially. Once she moves around it feels better. Stairs are difficult. Going up sometimes she can do reciprocal.    Pertinent History  CVA 2014, DVT left 2016, anxiety/depression, HTN, seizures (post stroke, one time), left ankle fracture 2016    Patient Stated Goals  have movement back, be able to be active again    Currently in Pain?  Yes    Pain Score  4     Pain Location  Foot    Pain Orientation  Left    Pain Descriptors / Indicators  Aching    Pain Type   Acute pain    Pain Onset  More than a month ago    Pain Frequency  Intermittent    Aggravating Factors   movement    Pain Relieving Factors  rest, elevating, cold    Effect of Pain on Daily Activities  limited with walking, stairs, exercise         [facility] PT Assessment - 03/13/20 0001      Assessment   Medical Diagnosis  closed nondisplaced fx left calcaneus body    Referring Provider (PT)  Dr. Benjamin Stain    Onset Date/Surgical Date  12/17/19    Next MD Visit  2 weeks      Precautions   Precautions  None      Restrictions   Weight Bearing Restrictions  Yes    LLE Weight Bearing  Weight bearing as tolerated      Balance Screen   Has the patient fallen in the past 6 months  No    Has the patient had a decrease in activity level because of a fear of falling?   No    Is the patient reluctant to leave their  home because of a fear of falling?   No      Home Public house manager residence    Living Arrangements  Spouse/significant other    Additional Comments  stairs      Prior Function   Level of Independence  Independent    Vocation  Full time employment    Marketing executive    Leisure  would like to get back to running      Observation/Other Assessments   Focus on Therapeutic Outcomes (FOTO)   64% limited      Observation/Other Assessments-Edema    Edema  Figure 8      Figure 8 Edema   Figure 8 - Right   51.75 cm    Figure 8 - Left   53.25 cm      Posture/Postural Control   Posture Comments  bil pes planus, genu valgus; decreased WB through LLE      ROM / Strength   AROM / PROM / Strength  AROM;PROM;Strength      AROM   Overall AROM Comments  left knee WNL    AROM Assessment Site  Ankle    Right/Left Ankle  Left    Left Ankle Dorsiflexion  -35    Left Ankle Plantar Flexion  55    Left Ankle Inversion  10    Left Ankle Eversion  4      PROM   PROM Assessment Site  Ankle    Right/Left Ankle  Left    Left Ankle  Dorsiflexion  10    Left Ankle Plantar Flexion  65    Left Ankle Inversion  35    Left Ankle Eversion  18      Strength   Overall Strength Comments  grossly 2+/5      Flexibility   Soft Tissue Assessment /Muscle Length  yes   tightness in left gastroc, peroneals, ant tib     Palpation   Patella mobility  calcaneal mobility decreased into eversion; pain with mobilization of left foot tarsals especially cuboid    Palpation comment  tender to palpation of left gastroc/soleus/peronals/ant tib/plantar fascia      Ambulation/Gait   Ambulation/Gait  Yes    Ambulation/Gait Assistance  7: Independent    Ambulation Distance (Feet)  40 Feet    Assistive device  None    Gait Pattern  Decreased stance time - left;Decreased step length - right;Decreased step length - left;Decreased stride length;Decreased dorsiflexion - left;Decreased weight shift to left;Decreased hip/knee flexion - left    Ambulation Surface  Level    Gait Comments  patient uses step-to gait on stairs at home                  Objective measurements completed on examination: See above findings.              PT Education - 03/13/20 1205    Education Details  HEP    Person(s) Educated  Patient    Methods  Explanation;Demonstration;Handout    Comprehension  Verbalized understanding;Returned demonstration          PT Long Term Goals - 03/13/20 1241      PT LONG TERM GOAL #1   Title  Ind with advanced HEP    Time  6    Period  Weeks    Status  New    Target Date  04/24/20      PT LONG TERM GOAL #2  Title  Improved left ankle ROM to [facility] to normalize gait and stairs.    Time  6    Period  Weeks    Status  New      PT LONG TERM GOAL #3   Title  Patient able to ambulate community distances with her normal footwear without gait deviations.    Time  6    Period  Weeks    Status  New      PT LONG TERM GOAL #4   Title  Improved left ankle strength to 4+/5 or better.    Time  6    Period   Weeks    Status  New      PT LONG TERM GOAL #5   Title  Patient able to climb stairs with a reciprocal gait pattern.    Time  6    Period  Weeks    Status  New      Additional Long Term Goals   Additional Long Term Goals  Yes      PT LONG TERM GOAL #6   Title  Patient able to complete ADLS with 7/34 pain or less in the left ankle.    Time  6    Period  Weeks    Status  New      PT LONG TERM GOAL #7   Title  Patient able to perform SLS activities on the LLE without difficulty    Time  6    Period  Weeks    Status  New             Plan - 03/13/20 1206    Clinical Impression Statement  Patient presents s/p left calcaneal fracture on 12/18/19. 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 She has marked limitations in active ROM and strength and limitations in passive eversion. These limitations affect her gait and ability to ascend/descend stairs. Pain is mainly in the morning upon standing, but she reports intermitent aching and occassional sharp pains up her leg as well. Her ROM limitations appear to be mainly due to muscle tightness. She has decreased mobiliity in calcaneal eversion and some pain with talocrural, talofib and tarsal mobs. She also has edema which worsens at the end of the day. Patient has access to pool for exercise. Patient has some fear of moving her ankle, but tolerated ankle and tarsal mobs and stretching well today. She will benefit from PT to address these deficits and return her to her PLOF.    Personal Factors and Comorbidities  Comorbidity 3+    Examination-Activity Limitations  Locomotion Level;Squat;Stairs    Stability/Clinical Decision Making  Stable/Uncomplicated    Clinical Decision Making  Low    Rehab Potential  Excellent    PT Frequency  2x / week    PT Duration  6 weeks    PT Treatment/Interventions  ADLs/Self Care Home  Management;Cryotherapy;Moist Heat;Gait training;Stair training;Therapeutic exercise;Therapeutic activities;Balance training;Neuromuscular re-education;Patient/family education;Manual techniques;Taping;Dry needling;Passive range of motion;Vasopneumatic Device;Joint Manipulations    PT Next Visit Plan  Review HEP; ankle ROM/strength, joint mobs, passive stretching progress to proprioception and balance    PT Home Exercise Plan  [email or web address]    Consulted and Agree with Plan of Care  Patient       Patient will benefit from skilled therapeutic intervention in order to improve the following  deficits and impairments:  Abnormal gait, Decreased range of motion, Pain, Postural dysfunction, Decreased strength, Decreased mobility, Impaired flexibility  Visit Diagnosis: Stiffness of left ankle, not elsewhere classified - Plan: PT plan of care cert/re-cert  Muscle weakness (generalized) - Plan: PT plan of care cert/re-cert  Localized edema - Plan: PT plan of care cert/re-cert  Pain in left ankle and joints of left foot - Plan: PT plan of care cert/re-cert     Problem List Patient Active Problem List   Diagnosis Date Noted  . Fracture of calcaneus, left, closed 12/19/2019  . Migraine with aura and without status migrainosus, not intractable 12/06/2019  . History of stroke 12/06/2019  . History of DVT of lower extremity 12/06/2019  . Chest pain 12/06/2019  . Essential hypertension 12/06/2019  . GAD (generalized anxiety disorder) 12/06/2019  . BMI 34.0-34.9,adult 12/06/2019   Solon Palm PT 03/13/2020, 12:52 PM  [facility] [address] [place], [place], [address] Phone: [phone]   Fax:  [phone]  Name: Linda [name] MRN: [id number] Date of Birth: [birth year]

## 2020-03-13 NOTE — Patient Instructions (Signed)
Access Code: [email or web address] URL: [email or web address] Date: 03/13/2020 Prepared by: Solon Palm  Exercises Long Sitting Calf Stretch with Strap - 2 x daily - 7 x weekly - 2 reps - 1 sets - 30" hold Seated Plantar Fascia Mobilization with Small Ball - 2 x daily - 7 x weekly - 2 reps - 1 sets - 60" hold Seated Ankle Dorsiflexion Stretch - 2 x daily - 7 x weekly - 2 reps - 1 sets - 30" hold Seated Toe Raise - 1 x daily - 7 x weekly - 10 reps - 3 sets Seated Heel Raise - 1 x daily - 7 x weekly - 10 reps - 3 sets Seated Ankle Circles - 2 x daily - 7 x weekly - 10 reps - 2 sets

## 2020-03-15 ENCOUNTER — Encounter: Payer: Self-pay | Admitting: Physical Therapy

## 2020-03-15 ENCOUNTER — Other Ambulatory Visit: Payer: Self-pay

## 2020-03-15 ENCOUNTER — Ambulatory Visit (INDEPENDENT_AMBULATORY_CARE_PROVIDER_SITE_OTHER): Payer: BC Managed Care – PPO | Admitting: Physical Therapy

## 2020-03-15 ENCOUNTER — Other Ambulatory Visit: Payer: Self-pay | Admitting: Medical-Surgical

## 2020-03-15 DIAGNOSIS — M25572 Pain in left ankle and joints of left foot: Secondary | ICD-10-CM | POA: Diagnosis not present

## 2020-03-15 DIAGNOSIS — M6281 Muscle weakness (generalized): Secondary | ICD-10-CM

## 2020-03-15 DIAGNOSIS — F411 Generalized anxiety disorder: Secondary | ICD-10-CM

## 2020-03-15 DIAGNOSIS — R600 Localized edema: Secondary | ICD-10-CM | POA: Diagnosis not present

## 2020-03-15 DIAGNOSIS — M25672 Stiffness of left ankle, not elsewhere classified: Secondary | ICD-10-CM

## 2020-03-15 NOTE — Telephone Encounter (Signed)
Checked on PA through cover my meds and they denied coverage due to the medication is not covered by the patient's plan. - CF

## 2020-03-15 NOTE — Therapy (Signed)
Linda [name], [place], [address] Phone: [phone]   Fax:  [phone]  Physical Therapy Treatment  Patient Details  Name: Linda [name] MRN: [id number] Date of Birth: [birth year] Referring Provider (PT): Dr. Dianah Field   Encounter Date: 03/15/2020  PT End of Session - 03/15/20 0925    Visit Number  2    Date for PT Re-Evaluation  04/24/20    PT Start Time  0930    PT Stop Time  1020    PT Time Calculation (min)  50 min    Activity Tolerance  Patient tolerated treatment well    Behavior During Therapy  [facility] for tasks assessed/performed       Past Medical History:  Diagnosis Date  . Anxiety   . Depression   . DVT (deep venous thrombosis) ([place])   . Hypertension   . Migraine   . Pap smear abnormality of cervix/human papillomavirus (HPV) positive   . Seizures ([place])   . Stroke [facility])     Past Surgical History:  Procedure Laterality Date  . CARPAL TUNNEL RELEASE    . CESAREAN SECTION    . INTERSTIM IMPLANT PLACEMENT    . tenze    . TUBAL LIGATION      There were no vitals filed for this visit.  Subjective Assessment - 03/15/20 0927    Subjective  I went for a walk yesterday and was pretty active. It was like a grapefruit when I was done.    Pertinent History  CVA 2014, DVT left 2016, anxiety/depression, HTN, seizures (post stroke, one time), left ankle fracture 2016    Patient Stated Goals  have movement back, be able to be active again    Currently in Pain?  Yes    Pain Score  5     Pain Location  Foot    Pain Orientation  Left                        [facility] Adult PT Treatment/Exercise - 03/15/20 0001      Exercises   Exercises  Ankle      Modalities   Modalities  Vasopneumatic      Vasopneumatic   Number Minutes Vasopneumatic   10 minutes    Vasopnuematic Location   Ankle    Vasopneumatic Pressure  Medium    Vasopneumatic Temperature   34 deg      Manual  Therapy   Manual Therapy  Joint mobilization;Soft tissue mobilization;Myofascial release    Joint Mobilization  tarsal mobs, metatarsals, calcaneal, talofiib (gentle); talocrural    Soft tissue mobilization  to left gastroc/soleus/peroneals/post tib; also IASTM to gastroc/soleus    Myofascial Release  to left plantar fascia      Ankle Exercises: Stretches   Plantar Fascia Stretch Limitations  10 reps in standing (great toe extension)    Gastroc Stretch Limitations  10 reps 5 sec hold bil in stdg (wt shifting)    Other Stretch  seated great toe ext stretch 1x20 sec      Ankle Exercises: Standing   Other Standing Ankle Exercises  gait slowly to encourage mid stance and toe off      Ankle Exercises: Seated   Heel Raises  5 reps;Both    Heel Raises Limitations  seated    Toe Raise  5 reps    Toe Raise Limitations  painful    BAPS  Sitting;Level 2;15 reps   DF/PF/Inv/Ever each  PT Long Term Goals - 03/13/20 1241      PT LONG TERM GOAL #1   Title  Ind with advanced HEP    Time  6    Period  Weeks    Status  New    Target Date  04/24/20      PT LONG TERM GOAL #2   Title  Improved left ankle ROM to [facility] to normalize gait and stairs.    Time  6    Period  Weeks    Status  New      PT LONG TERM GOAL #3   Title  Patient able to ambulate community distances with her normal footwear without gait deviations.    Time  6    Period  Weeks    Status  New      PT LONG TERM GOAL #4   Title  Improved left ankle strength to 4+/5 or better.    Time  6    Period  Weeks    Status  New      PT LONG TERM GOAL #5   Title  Patient able to climb stairs with a reciprocal gait pattern.    Time  6    Period  Weeks    Status  New      Additional Long Term Goals   Additional Long Term Goals  Yes      PT LONG TERM GOAL #6   Title  Patient able to complete ADLS with 1/10 pain or less in the left ankle.    Time  6    Period  Weeks    Status  New      PT LONG TERM  GOAL #7   Title  Patient able to perform SLS activities on the LLE without difficulty    Time  6    Period  Weeks    Status  New            Plan - 03/15/20 1042    Clinical Impression Statement  Patient demonstrated increased ankle ROM compared to intake 2 days ago. She has pain with PF and throughout mid foot. She amb on her left heel today and avoids stance to toe off stage. We worked on weight shifting and stretching and pt was able to demo improvement. She has TPs in her medial and lateral gastroc and in quadratus plantae.    PT Treatment/Interventions  ADLs/Self Care Home Management;Cryotherapy;Moist Heat;Gait training;Stair training;Therapeutic exercise;Therapeutic activities;Balance training;Neuromuscular re-education;Patient/family education;Manual techniques;Taping;Dry needling;Passive range of motion;Vasopneumatic Device;Joint Manipulations    PT Next Visit Plan  Review HEP; ankle ROM/strength, STW/DN to gastroc/soleus/plantar fascia, joint mobs, passive stretching progress to proprioception and balance    PT Home Exercise Plan  [email or web address]       Patient will benefit from skilled therapeutic intervention in order to improve the following deficits and impairments:  Abnormal gait, Decreased range of motion, Pain, Postural dysfunction, Decreased strength, Decreased mobility, Impaired flexibility  Visit Diagnosis: Stiffness of left ankle, not elsewhere classified  Muscle weakness (generalized)  Localized edema  Pain in left ankle and joints of left foot     Problem List Patient Active Problem List   Diagnosis Date Noted  . Fracture of calcaneus, left, closed 12/19/2019  . Migraine with aura and without status migrainosus, not intractable 12/06/2019  . History of stroke 12/06/2019  . History of DVT of lower extremity 12/06/2019  . Chest pain 12/06/2019  . Essential hypertension 12/06/2019  . GAD (generalized anxiety disorder)  12/06/2019  . BMI 34.0-34.9,adult 12/06/2019     Solon Palm PT 03/15/2020, 10:51 AM  [facility] [address] [place], [place], [address] Phone: [phone]   Fax:  [phone]  Name: Linda [name] MRN: [id number] Date of Birth: [birth year]

## 2020-03-21 ENCOUNTER — Encounter: Payer: BC Managed Care – PPO | Admitting: Physical Therapy

## 2020-03-23 ENCOUNTER — Ambulatory Visit (INDEPENDENT_AMBULATORY_CARE_PROVIDER_SITE_OTHER): Payer: BC Managed Care – PPO | Admitting: Rehabilitative and Restorative Service Providers"

## 2020-03-23 ENCOUNTER — Other Ambulatory Visit: Payer: Self-pay

## 2020-03-23 DIAGNOSIS — M25672 Stiffness of left ankle, not elsewhere classified: Secondary | ICD-10-CM | POA: Diagnosis not present

## 2020-03-23 DIAGNOSIS — M25572 Pain in left ankle and joints of left foot: Secondary | ICD-10-CM

## 2020-03-23 DIAGNOSIS — R600 Localized edema: Secondary | ICD-10-CM

## 2020-03-23 DIAGNOSIS — M6281 Muscle weakness (generalized): Secondary | ICD-10-CM

## 2020-03-23 NOTE — Patient Instructions (Signed)
Access Code: [email or web address] URL: [email or web address] Date: 03/23/2020 Prepared by: Margretta Ditty  Exercises Long Sitting Calf Stretch with Strap - 2 x daily - 7 x weekly - 2 reps - 1 sets - 30" hold Seated Plantar Fascia Mobilization with Small Ball - 2 x daily - 7 x weekly - 2 reps - 1 sets - 60" hold Seated Ankle Dorsiflexion Stretch - 2 x daily - 7 x weekly - 2 reps - 1 sets - 30" hold Seated Toe Raise - 1 x daily - 7 x weekly - 10 reps - 3 sets Seated Heel Raise - 1 x daily - 7 x weekly - 10 reps - 3 sets Seated Ankle Circles - 2 x daily - 7 x weekly - 10 reps - 2 sets Standing Toe Dorsiflexion Stretch - 1 x daily - 7 x weekly - 10 reps - 3 sets Seated Hamstring Stretch with Chair - 2 x daily - 7 x weekly - 1 sets - 3 reps - 20 seconds hold  Patient Education Trigger Point Dry Needling

## 2020-03-23 NOTE — Therapy (Signed)
[facility] [place] [address] [place] [address] [place], [place], [address] Phone: [phone]   Fax:  [phone]  Physical Therapy Treatment  Patient Details  Name: Linda [name] MRN: [id number] Date of Birth: 14-Dec-1980 Referring Provider (PT): Dr. Benjamin Stain   Encounter Date: 03/23/2020  PT End of Session - 03/23/20 1213    Visit Number  3    Number of Visits  12    Date for PT Re-Evaluation  04/24/20    PT Start Time  1150    PT Stop Time  1235    PT Time Calculation (min)  45 min    Activity Tolerance  Patient tolerated treatment well    Behavior During Therapy  [facility] for tasks assessed/performed       Past Medical History:  Diagnosis Date  . Anxiety   . Depression   . DVT (deep venous thrombosis) (HCC)   . Hypertension   . Migraine   . Pap smear abnormality of cervix/human papillomavirus (HPV) positive   . Seizures (HCC)   . Stroke [facility])     Past Surgical History:  Procedure Laterality Date  . CARPAL TUNNEL RELEASE    . CESAREAN SECTION    . INTERSTIM IMPLANT PLACEMENT    . tenze    . TUBAL LIGATION      There were no vitals filed for this visit.  Subjective Assessment - 03/23/20 1153    Subjective  The patient reports she is on her feet most of the day at work.  She is doing ther ex regularly.    Pertinent History  CVA 2014, DVT left 2016, anxiety/depression, HTN, seizures (post stroke, one time), left ankle fracture 2016    Patient Stated Goals  have movement back, be able to be active again    Currently in Pain?  Yes    Pain Score  5     Pain Location  Foot    Pain Orientation  Left    Pain Descriptors / Indicators  Aching    Pain Onset  More than a month ago    Pain Frequency  Intermittent    Aggravating Factors   movement, walking    Pain Relieving Factors  rest, elevating, cold         [facility] PT Assessment - 03/23/20 1154      Assessment   Medical Diagnosis  closed nondisplaced fx left calcaneus body     Referring Provider (PT)  Dr. Benjamin Stain    Onset Date/Surgical Date  12/17/19      AROM   Left Ankle Dorsiflexion  2                    [facility] Adult PT Treatment/Exercise - 03/23/20 1154      Ambulation/Gait   Ambulation/Gait  Yes    Ambulation/Gait Assistance  7: Independent    Ambulation Distance (Feet)  150 Feet    Assistive device  None    Gait Comments  Emphasized rolling over foot from heel to toe and dec'ing supination of the heel      Exercises   Exercises  Ankle;Knee/Hip      Knee/Hip Exercises: Stretches   Active Hamstring Stretch  Left;2 reps;30 seconds      Knee/Hip Exercises: Supine   Bridges  Strengthening;Both;10 reps    Bridges Limitations  encouraging full ROM- notes discomfort in ankle with increasing repetitions      Knee/Hip Exercises: Sidelying   Hip ABduction  Strengthening;Left;10 reps  Vasopneumatic   Number Minutes Vasopneumatic   10 minutes    Vasopnuematic Location   Ankle    Vasopneumatic Pressure  Medium    Vasopneumatic Temperature   34 deg      Manual Therapy   Manual Therapy  Joint mobilization;Soft tissue mobilization;Myofascial release    Manual therapy comments  to improve AROM and reduce tightness    Joint Mobilization  calcaneal/ talocrural mobs grade II to tolerance    Soft tissue mobilization  L gastroc/soleous/posterior tib, IASTM, plantar surface    Myofascial Release  L plantar fascia      Ankle Exercises: Stretches   Gastroc Stretch  2 reps;30 seconds    Gastroc Stretch Limitations  standing with L foot heel drop off 4" step and seated knee flexion into ankle DF      Ankle Exercises: Aerobic   Stationary Bike  L 1 x 4 minutes for warm up      Ankle Exercises: Standing   Toe Raise  10 reps    Toe Raise Limitations  L ankle only    Other Standing Ankle Exercises  Standing in stride rocking anterior/posterior working on ankle mobility and hip positioning prior to gait      Ankle Exercises: Seated   Other  Seated Ankle Exercises  seated ankle eversion AROM x 10 reps L      Ankle Exercises: Supine   Other Supine Ankle Exercises  ankle DF with theraband x 5 reps with anterior ankle discomfort      Ankle Exercises: Sidelying   Other Sidelying Ankle Exercises  --             PT Education - 03/23/20 1225    Education Details  HEP    Person(s) Educated  Patient    Methods  Explanation;Handout;Demonstration    Comprehension  Verbalized understanding;Returned demonstration          PT Long Term Goals - 03/13/20 1241      PT LONG TERM GOAL #1   Title  Ind with advanced HEP    Time  6    Period  Weeks    Status  New    Target Date  04/24/20      PT LONG TERM GOAL #2   Title  Improved left ankle ROM to [facility] to normalize gait and stairs.    Time  6    Period  Weeks    Status  New      PT LONG TERM GOAL #3   Title  Patient able to ambulate community distances with her normal footwear without gait deviations.    Time  6    Period  Weeks    Status  New      PT LONG TERM GOAL #4   Title  Improved left ankle strength to 4+/5 or better.    Time  6    Period  Weeks    Status  New      PT LONG TERM GOAL #5   Title  Patient able to climb stairs with a reciprocal gait pattern.    Time  6    Period  Weeks    Status  New      Additional Long Term Goals   Additional Long Term Goals  Yes      PT LONG TERM GOAL #6   Title  Patient able to complete ADLS with 4/31 pain or less in the left ankle.    Time  6  Period  Weeks    Status  New      PT LONG TERM GOAL #7   Title  Patient able to perform SLS activities on the LLE without difficulty    Time  6    Period  Weeks    Status  New            Plan - 03/23/20 1306    Clinical Impression Statement  The patient is progressing AROM L ankle.  Gait is still antalgic in nature with tightness up the kinetic chain from maladaptive compensations.  PT contnuing to progress AROM through [facility] and strengthening, and will continue  working to [employer].    PT Treatment/Interventions  ADLs/Self Care Home Management;Cryotherapy;Moist Heat;Gait training;Stair training;Therapeutic exercise;Therapeutic activities;Balance training;Neuromuscular re-education;Patient/family education;Manual techniques;Taping;Dry needling;Passive range of motion;Vasopneumatic Device;Joint Manipulations    PT Next Visit Plan  Review HEP; ankle ROM/strength, STW/DN to gastroc/soleus/plantar fascia, joint mobs, passive stretching progress to proprioception and balance    PT Home Exercise Plan  [id number]    Consulted and Agree with Plan of Care  Patient       Patient will benefit from skilled therapeutic intervention in order to improve the following deficits and impairments:  Abnormal gait, Decreased range of motion, Pain, Postural dysfunction, Decreased strength, Decreased mobility, Impaired flexibility  Visit Diagnosis: Stiffness of left ankle, not elsewhere classified  Muscle weakness (generalized)  Localized edema  Pain in left ankle and joints of left foot     Problem List Patient Active Problem List   Diagnosis Date Noted  . Fracture of calcaneus, left, closed 12/19/2019  . Migraine with aura and without status migrainosus, not intractable 12/06/2019  . History of stroke 12/06/2019  . History of DVT of lower extremity 12/06/2019  . Chest pain 12/06/2019  . Essential hypertension 12/06/2019  . GAD (generalized anxiety disorder) 12/06/2019  . BMI 34.0-34.9,adult 12/06/2019    Oiva Dibari, PT 03/23/2020, 2:09 PM  [facility] [address] [place], [place], [address] Phone: [phone]   Fax:  [phone]  Name: Gwendlyon Zumbro MRN: [id number] Date of Birth: 02-18-1981

## 2020-03-25 ENCOUNTER — Other Ambulatory Visit: Payer: Self-pay | Admitting: Medical-Surgical

## 2020-03-25 DIAGNOSIS — K219 Gastro-esophageal reflux disease without esophagitis: Secondary | ICD-10-CM

## 2020-03-26 ENCOUNTER — Ambulatory Visit (INDEPENDENT_AMBULATORY_CARE_PROVIDER_SITE_OTHER): Payer: BC Managed Care – PPO | Admitting: Physical Therapy

## 2020-03-26 ENCOUNTER — Other Ambulatory Visit: Payer: Self-pay

## 2020-03-26 ENCOUNTER — Encounter: Payer: Self-pay | Admitting: Physical Therapy

## 2020-03-26 DIAGNOSIS — M25572 Pain in left ankle and joints of left foot: Secondary | ICD-10-CM

## 2020-03-26 DIAGNOSIS — R600 Localized edema: Secondary | ICD-10-CM

## 2020-03-26 DIAGNOSIS — M6281 Muscle weakness (generalized): Secondary | ICD-10-CM | POA: Diagnosis not present

## 2020-03-26 DIAGNOSIS — M25672 Stiffness of left ankle, not elsewhere classified: Secondary | ICD-10-CM | POA: Diagnosis not present

## 2020-03-26 NOTE — Therapy (Signed)
[facility] [place] [address] [place] [address] [place], [place], [address] Phone: [phone]   Fax:  [phone]  Physical Therapy Treatment  Patient Details  Name: Linda [name] MRN: [id number] Date of Birth: July 28, 1981 Referring Provider (PT): Dr. Benjamin Stain   Encounter Date: 03/26/2020  PT End of Session - 03/26/20 1148    Visit Number  4    Number of Visits  12    Date for PT Re-Evaluation  04/24/20    PT Start Time  1148    PT Stop Time  1256    PT Time Calculation (min)  68 min    Activity Tolerance  Patient tolerated treatment well    Behavior During Therapy  [facility] for tasks assessed/performed       Past Medical History:  Diagnosis Date  . Anxiety   . Depression   . DVT (deep venous thrombosis) (HCC)   . Hypertension   . Migraine   . Pap smear abnormality of cervix/human papillomavirus (HPV) positive   . Seizures (HCC)   . Stroke [facility])     Past Surgical History:  Procedure Laterality Date  . CARPAL TUNNEL RELEASE    . CESAREAN SECTION    . INTERSTIM IMPLANT PLACEMENT    . tenze    . TUBAL LIGATION      There were no vitals filed for this visit.  Subjective Assessment - 03/26/20 1149    Subjective  Pt reports she is doing well, is now able to walk up stairs alternating steps, unable to go down as her ankle stops the movement    Patient Stated Goals  have movement back, be able to be active again    Currently in Pain?  Yes    Pain Score  3     Pain Location  Ankle    Pain Orientation  Left;Medial    Pain Descriptors / Indicators  Aching    Pain Type  Acute pain                        [facility] Adult PT Treatment/Exercise - 03/26/20 0001      Knee/Hip Exercises: Aerobic   Stationary Bike  --      Modalities   Modalities  Moist Heat;Vasopneumatic      Moist Heat Therapy   Number Minutes Moist Heat  15 Minutes    Moist Heat Location  --   Lt gastroc     Vasopneumatic   Number Minutes  Vasopneumatic   15 minutes    Vasopnuematic Location   Ankle    Vasopneumatic Pressure  Medium    Vasopneumatic Temperature   34 deg      Manual Therapy   Manual therapy comments  skilled palpation and monitroing during DN    Joint Mobilization  posterior talus mobs grade III, calcaneal rock    Soft tissue mobilization  STM to to Lt gastrocsoleous       Ankle Exercises: Aerobic   Stationary Bike  L1x5'      Ankle Exercises: Standing   BAPS  --   attempted - to painful to stand on Lt LE alone      Trigger Point Dry Needling - 03/26/20 0001    Consent Given?  Yes    Education Handout Provided  Yes    Muscles Treated Lower Quadrant  Gastrocnemius    Electrical Stimulation Performed with Dry Needling  Yes    Gastrocnemius Response  Palpable increased muscle length;Twitch  response elicited   Lt with TENs               PT Long Term Goals - 03/13/20 1241      PT LONG TERM GOAL #1   Title  Ind with advanced HEP    Time  6    Period  Weeks    Status  New    Target Date  04/24/20      PT LONG TERM GOAL #2   Title  Improved left ankle ROM to [facility] to normalize gait and stairs.    Time  6    Period  Weeks    Status  New      PT LONG TERM GOAL #3   Title  Patient able to ambulate community distances with her normal footwear without gait deviations.    Time  6    Period  Weeks    Status  New      PT LONG TERM GOAL #4   Title  Improved left ankle strength to 4+/5 or better.    Time  6    Period  Weeks    Status  New      PT LONG TERM GOAL #5   Title  Patient able to climb stairs with a reciprocal gait pattern.    Time  6    Period  Weeks    Status  New      Additional Long Term Goals   Additional Long Term Goals  Yes      PT LONG TERM GOAL #6   Title  Patient able to complete ADLS with 3/81 pain or less in the left ankle.    Time  6    Period  Weeks    Status  New      PT LONG TERM GOAL #7   Title  Patient able to perform SLS activities on the LLE  without difficulty    Time  6    Period  Weeks    Status  New            Plan - 03/26/20 1246    Clinical Impression Statement  Pt reports improved feelings of ROM after manual work.  She is very limited to [facility] on the Rt LE d/t ankle pain.  She was able to do some self ankle mobilization in standing to work on improving functional dorsiflexion.    Rehab Potential  Excellent    PT Frequency  2x / week    PT Duration  6 weeks    PT Treatment/Interventions  ADLs/Self Care Home Management;Cryotherapy;Moist Heat;Gait training;Stair training;Therapeutic exercise;Therapeutic activities;Balance training;Neuromuscular re-education;Patient/family education;Manual techniques;Taping;Dry needling;Passive range of motion;Vasopneumatic Device;Joint Manipulations    PT Next Visit Plan  assess response to DN and need for more.  add in proprioception    Consulted and Agree with Plan of Care  Patient       Patient will benefit from skilled therapeutic intervention in order to improve the following deficits and impairments:  Abnormal gait, Decreased range of motion, Pain, Postural dysfunction, Decreased strength, Decreased mobility, Impaired flexibility  Visit Diagnosis: Stiffness of left ankle, not elsewhere classified  Muscle weakness (generalized)  Localized edema  Pain in left ankle and joints of left foot     Problem List Patient Active Problem List   Diagnosis Date Noted  . Fracture of calcaneus, left, closed 12/19/2019  . Migraine with aura and without status migrainosus, not intractable 12/06/2019  . History of stroke 12/06/2019  .  History of DVT of lower extremity 12/06/2019  . Chest pain 12/06/2019  . Essential hypertension 12/06/2019  . GAD (generalized anxiety disorder) 12/06/2019  . BMI 34.0-34.9,adult 12/06/2019    Morejon Fillers rPT  03/26/2020, 12:51 PM  [facility] [address] [place] [address] [place], [place],  [address] Phone: [phone]   Fax:  [phone]  Name: Linda [name] MRN: [id number] Date of Birth: 1981-01-07

## 2020-03-28 ENCOUNTER — Encounter: Payer: Self-pay | Admitting: Sports Medicine

## 2020-03-28 ENCOUNTER — Encounter: Payer: Self-pay | Admitting: Physical Therapy

## 2020-03-28 ENCOUNTER — Ambulatory Visit (INDEPENDENT_AMBULATORY_CARE_PROVIDER_SITE_OTHER): Payer: BC Managed Care – PPO | Admitting: Physical Therapy

## 2020-03-28 ENCOUNTER — Ambulatory Visit (INDEPENDENT_AMBULATORY_CARE_PROVIDER_SITE_OTHER): Payer: BC Managed Care – PPO | Admitting: Medical-Surgical

## 2020-03-28 ENCOUNTER — Ambulatory Visit (INDEPENDENT_AMBULATORY_CARE_PROVIDER_SITE_OTHER): Payer: BC Managed Care – PPO | Admitting: Sports Medicine

## 2020-03-28 ENCOUNTER — Other Ambulatory Visit: Payer: Self-pay

## 2020-03-28 ENCOUNTER — Encounter: Payer: Self-pay | Admitting: Medical-Surgical

## 2020-03-28 VITALS — BP 142/101 | HR 76 | Temp 98.0°F | Ht 65.5 in | Wt 212.3 lb

## 2020-03-28 DIAGNOSIS — M25572 Pain in left ankle and joints of left foot: Secondary | ICD-10-CM | POA: Diagnosis not present

## 2020-03-28 DIAGNOSIS — M25672 Stiffness of left ankle, not elsewhere classified: Secondary | ICD-10-CM | POA: Diagnosis not present

## 2020-03-28 DIAGNOSIS — S92015D Nondisplaced fracture of body of left calcaneus, subsequent encounter for fracture with routine healing: Secondary | ICD-10-CM | POA: Diagnosis not present

## 2020-03-28 DIAGNOSIS — M6281 Muscle weakness (generalized): Secondary | ICD-10-CM

## 2020-03-28 DIAGNOSIS — Z6834 Body mass index (BMI) 34.0-34.9, adult: Secondary | ICD-10-CM | POA: Diagnosis not present

## 2020-03-28 DIAGNOSIS — I10 Essential (primary) hypertension: Secondary | ICD-10-CM | POA: Diagnosis not present

## 2020-03-28 DIAGNOSIS — R600 Localized edema: Secondary | ICD-10-CM | POA: Diagnosis not present

## 2020-03-28 MED ORDER — PHENTERMINE HCL 8 MG PO TABS: 8.0000 mg | ORAL_TABLET | Freq: Every day | ORAL | 0 refills | Status: DC

## 2020-03-28 MED ORDER — HYDROCHLOROTHIAZIDE 25 MG PO TABS: 25.0000 mg | ORAL_TABLET | Freq: Every day | ORAL | 3 refills | Status: DC

## 2020-03-28 NOTE — Progress Notes (Signed)
    Procedures performed today:    None.  Independent interpretation of notes and tests performed by another provider:   None.  Brief History, Exam, Impression, and Recommendations:    Fracture of calcaneus, left, closed This pleasant 39 year old female returns, she had a severe comminuted calcaneal fracture, she is for the most part pain-free, she is now approximately 3-1/2 months post fracture. She is continuing with physical therapy and improving slowly, I not think there is much of a role for me anymore, she is in the hands of physical therapy and they can manage from here on out and discharge her with a home exercise program when appropriate. I did advise her that the swelling could persist for several months after a fracture of this magnitude.    ___________________________________________ Ihor Austin. Benjamin Stain, M.D., ABFM., CAQSM. Primary Care and Sports Medicine [facility]  Adjunct Instructor of Family Medicine  [employer]

## 2020-03-28 NOTE — Patient Instructions (Signed)
Access Code: [facility] URL: [email or web address] Date: 03/28/2020 Prepared by: Roderic Scarce  Exercises Romberg Stance on Foam Pad - 1 x daily - 1 reps - 30 hold Standing Balance with Eyes Closed on Foam - 1 x daily - 1 reps - 30 hold Standing on Foam Pad - 1 x daily - 1 reps - 30 hold

## 2020-03-28 NOTE — Therapy (Addendum)
[place], [place], [address] Phone: [phone]   Fax:  [phone]  Physical Therapy Treatment and Discharge Summary  Patient Details  Name: Linda [name] MRN: [id number] Date of Birth: Jun 22, 1981 Referring Provider (PT): Dr. Dianah Field   Encounter Date: 03/28/2020  PT End of Session - 03/28/20 1151    Visit Number  5    Number of Visits  12    Date for PT Re-Evaluation  04/24/20    PT Start Time  1150    PT Stop Time  1248    PT Time Calculation (min)  58 min    Activity Tolerance  Patient tolerated treatment well    Behavior During Therapy  [facility] for tasks assessed/performed       Past Medical History:  Diagnosis Date  . Anxiety   . Depression   . DVT (deep venous thrombosis) ([place])   . Hypertension   . Migraine   . Pap smear abnormality of cervix/human papillomavirus (HPV) positive   . Seizures ([place])   . Stroke [facility])     Past Surgical History:  Procedure Laterality Date  . CARPAL TUNNEL RELEASE    . CESAREAN SECTION    . INTERSTIM IMPLANT PLACEMENT    . tenze    . TUBAL LIGATION      There were no vitals filed for this visit.  Subjective Assessment - 03/28/20 1151    Subjective  Pt reports she is still feeling tightness in the front of her ankle. Yesterday was a little rough, today is better    Patient Stated Goals  have movement back, be able to be active again    Currently in Pain?  Yes    Pain Score  3     Pain Location  Ankle    Pain Orientation  Left    Pain Descriptors / Indicators  Aching    Pain Type  Acute pain                        [facility] Adult PT Treatment/Exercise - 03/28/20 0001      Knee/Hip Exercises: Machines for Strengthening   Total Gym Leg Press  7 plates bringing heels down to work on DF stretching       Modalities   Modalities  Vasopneumatic      Vasopneumatic   Number Minutes Vasopneumatic   15 minutes    Vasopnuematic Location    Ankle    Vasopneumatic Pressure  Medium    Vasopneumatic Temperature   34 deg      Manual Therapy   Joint Mobilization  posterior talus mobs grade III, calcaneal rock, metatarsal mobs       Ankle Exercises: Aerobic   Stationary Bike  L3x5'      Ankle Exercises: Stretches   Soleus Stretch  3 reps;30 seconds   on slant board   Gastroc Stretch  3 reps;30 seconds   on slant board      Ankle Exercises: Standing   Other Standing Ankle Exercises  standing on airex - head turns side/side, up/down, eyes closed and arm movemetns       Ankle Exercises: Seated   ABC's  1 rep             PT Education - 03/28/20 1215    Education Details  HEP for beginner proprioception    Person(s) Educated  Patient    Methods  Explanation;Demonstration;Handout    Comprehension  Returned demonstration;Verbalized understanding          PT Long Term Goals - 03/13/20 1241      PT LONG TERM GOAL #1   Title  Ind with advanced HEP    Time  6    Period  Weeks    Status  New    Target Date  04/24/20      PT LONG TERM GOAL #2   Title  Improved left ankle ROM to [facility] to normalize gait and stairs.    Time  6    Period  Weeks    Status  New      PT LONG TERM GOAL #3   Title  Patient able to ambulate community distances with her normal footwear without gait deviations.    Time  6    Period  Weeks    Status  New      PT LONG TERM GOAL #4   Title  Improved left ankle strength to 4+/5 or better.    Time  6    Period  Weeks    Status  New      PT LONG TERM GOAL #5   Title  Patient able to climb stairs with a reciprocal gait pattern.    Time  6    Period  Weeks    Status  New      Additional Long Term Goals   Additional Long Term Goals  Yes      PT LONG TERM GOAL #6   Title  Patient able to complete ADLS with 5/18 pain or less in the left ankle.    Time  6    Period  Weeks    Status  New      PT LONG TERM GOAL #7   Title  Patient able to perform SLS activities on the LLE without  difficulty    Time  6    Period  Weeks    Status  New            Plan - 03/28/20 1237    Clinical Impression Statement  Pt reports difficulty when walking on her grass.  Added some beginner proprioception work to her HEP standing on a pillow with both LEs as SLS is too painful at this time.  She had improve calcaneal movment with mobs today, talus is still tight.  Good mobility through the metatarsals.    Rehab Potential  Excellent    PT Frequency  2x / week    PT Duration  6 weeks    PT Treatment/Interventions  ADLs/Self Care Home Management;Cryotherapy;Moist Heat;Gait training;Stair training;Therapeutic exercise;Therapeutic activities;Balance training;Neuromuscular re-education;Patient/family education;Manual techniques;Taping;Dry needling;Passive range of motion;Vasopneumatic Device;Joint Manipulations    PT Next Visit Plan  cont with ROM, proprioception and increasing tolerance to loading the foot    Consulted and Agree with Plan of Care  Patient       Patient will benefit from skilled therapeutic intervention in order to improve the following deficits and impairments:  Abnormal gait, Decreased range of motion, Pain, Postural dysfunction, Decreased strength, Decreased mobility, Impaired flexibility  Visit Diagnosis: Stiffness of left ankle, not elsewhere classified  Muscle weakness (generalized)  Localized edema  Pain in left ankle and joints of left foot     Problem List Patient Active Problem List   Diagnosis Date Noted  . Fracture of calcaneus, left, closed 12/19/2019  . Migraine with aura and without status migrainosus, not intractable 12/06/2019  . History of stroke 12/06/2019  .  History of DVT of lower extremity 12/06/2019  . Chest pain 12/06/2019  . Essential hypertension 12/06/2019  . GAD (generalized anxiety disorder) 12/06/2019  . BMI 34.0-34.9,adult 12/06/2019    Jeral Pinch PT  03/28/2020, 12:40 PM  [facility] [place], [place], [address] Phone: [phone]   Fax:  [phone]  Name: Linda [name] MRN: [id number] Date of Birth: 10/30/1980  PHYSICAL THERAPY DISCHARGE SUMMARY  Visits from Start of Care: 5  Current functional level related to goals / functional outcomes: See above   Remaining deficits: See above   Education / Equipment: HEP  Plan: Patient agrees to discharge.  Patient goals were not met. Patient is being discharged due to not returning since the last visit.  ?????     Madelyn Flavors, PT 05/08/20 12:57 PM  [facility] at [place] [address] [place], [place] [address]  [phone] (office) [phone] (fax)

## 2020-03-28 NOTE — Assessment & Plan Note (Signed)
This pleasant 39 year old female returns, she had a severe comminuted calcaneal fracture, she is for the most part pain-free, she is now approximately 3-1/2 months post fracture. She is continuing with physical therapy and improving slowly, I not think there is much of a role for me anymore, she is in the hands of physical therapy and they can manage from here on out and discharge her with a home exercise program when appropriate. I did advise her that the swelling could persist for several months after a fracture of this magnitude.

## 2020-03-28 NOTE — Progress Notes (Signed)
Subjective:    CC: weight check  HPI: Pleasant 39 year old female presenting today for a weight check.  She has been taking phentermine 8 mg daily in conjunction with Topamax 100 mg daily.  Tolerating medications well without side effects.  Reports that she continues to have an okay appetite.  Has made quite a few dietary modifications.  Reports her daily consumption consists of egg whites for breakfast, snacks on carrots and hummus, chicken and a vegetable at lunch, and a salad for dinner.  She stopped eating by 7 PM at night and attempt to go to bed near 9 PM.  She has stopped drinking coffee and beer.  She does occasionally have a glass low-carb white wine.  She also drinks 1 Dr. Malachi Bonds in the morning and reports it is her only indulgence.  She has become more active now that she is out of the walking boot and reports going to swim with her friend regularly as well as doing physical therapy.  Blood pressure is elevated today at 142/101.  Also elevated in the 140s over 90s at last appointment.  Denies chest pain, shortness of breath, palpitations, lower extremity edema, and dizziness.  Continues to have intermittent headaches but wonders if these may be related to her blood pressure going back up.  I reviewed the past medical history, family history, social history, surgical history, and allergies today and no changes were needed.  Please see the problem list section below in epic for further details.  Past Medical History: Past Medical History:  Diagnosis Date  . Anxiety   . Depression   . DVT (deep venous thrombosis) ([place])   . Hypertension   . Migraine   . Pap smear abnormality of cervix/human papillomavirus (HPV) positive   . Seizures ([place])   . Stroke [facility])    Past Surgical History: Past Surgical History:  Procedure Laterality Date  . CARPAL TUNNEL RELEASE    . CESAREAN SECTION    . INTERSTIM IMPLANT PLACEMENT    . tenze    . TUBAL LIGATION     Social History: Social History    Socioeconomic History  . Marital status: Single    Spouse name: Not on file  . Number of children: Not on file  . Years of education: Not on file  . Highest education level: Not on file  Occupational History  . Not on file  Tobacco Use  . Smoking status: Former Research scientist (life sciences)  . Smokeless tobacco: Never Used  Substance and Sexual Activity  . Alcohol use: Yes    Alcohol/week: 3.0 standard drinks    Types: 3 Glasses of wine per week  . Drug use: Never  . Sexual activity: Yes    Partners: Male    Birth control/protection: Surgical  Other Topics Concern  . Not on file  Social History Narrative  . Not on file   Social Determinants of Health   Financial Resource Strain:   . Difficulty of Paying Living Expenses:   Food Insecurity:   . Worried About Charity fundraiser in the Last Year:   . Arboriculturist in the Last Year:   Transportation Needs:   . Film/video editor (Medical):   Marland Kitchen Lack of Transportation (Non-Medical):   Physical Activity:   . Days of Exercise per Week:   . Minutes of Exercise per Session:   Stress:   . Feeling of Stress :   Social Connections:   . Frequency of Communication with Friends and Family:   .  Frequency of Social Gatherings with Friends and Family:   . Attends Religious Services:   . Active Member of Clubs or Organizations:   . Attends Banker Meetings:   Marland Kitchen Marital Status:    Family History: Family History  Problem Relation Age of Onset  . Hypertension Mother   . Hypertension Father   . Clotting disorder Father   . Diabetes Father   . Deep vein thrombosis Father   . Breast cancer Maternal Aunt   . Factor V Leiden deficiency Maternal Aunt   . Deep vein thrombosis Paternal Aunt   . Stroke Maternal Grandmother   . Prostate cancer Maternal Grandfather   . Stroke Paternal Grandfather   . Colon cancer Paternal Grandfather   . Breast cancer Maternal Great-grandmother    Allergies: Allergies  Allergen Reactions  . Chocolate  Anaphylaxis  . Cocoa Butter Anaphylaxis  . Codeine Hives and Itching  . Propoxyphene Itching  . Sulfa Antibiotics     Yeast infection  . Lisinopril Cough   Medications: See med rec.  Review of Systems: See HPI for pertinent positives and negatives.   Objective:    General: Well Developed, well nourished, and in no acute distress.  Neuro: Alert and oriented x3.  HEENT: Normocephalic, atraumatic.  Skin: Warm and dry. Cardiac: Regular rate and rhythm, no murmurs rubs or gallops, no lower extremity edema.  Respiratory: Clear to auscultation bilaterally. Not using accessory muscles, speaking in full sentences.   Impression and Recommendations:    1. Essential hypertension Continue losartan 25 mg daily.  Adding in HCTZ 25 mg daily.  Continue checking blood pressure at home, increased activity, and low-sodium diet. - hydrochlorothiazide (HYDRODIURIL) 25 MG tablet; Take 1 tablet (25 mg total) by mouth daily.  Dispense: 90 tablet; Refill: 3  2. BMI 34.0-34.9,adult Discussed weight loss, lifestyle modifications, and barriers.  We will continue phentermine 8 mg in conjunction with Topamax 100 mg daily for another 4 weeks.  We will reevaluate weight loss at that time and look at dose changes.  Advised patient to limit sugary drinks and nighttime eating as this may limit results.  Also recommend keeping a food diary or using an app to log foods including portions and calories. - Phentermine HCl 8 MG TABS; Take 8 mg by mouth daily.  Dispense: 28 tablet; Refill: 0  Return in about 4 weeks (around 04/25/2020) for weight loss/HTN. ___________________________________________ Thayer Ohm, DNP, APRN, FNP-BC Primary Care and Sports Medicine [facility] [place]

## 2020-04-02 ENCOUNTER — Encounter: Payer: BC Managed Care – PPO | Admitting: Physical Therapy

## 2020-04-02 ENCOUNTER — Telehealth: Payer: Self-pay | Admitting: Physical Therapy

## 2020-04-02 NOTE — Telephone Encounter (Signed)
Patient did not show for PT appt.  Called and left HIPAA compliant voice mail for patient to return call to [facility] at [phone].  Unable to confirm next appt.    Mayer Camel, PTA 04/02/20 4:33 PM

## 2020-04-05 ENCOUNTER — Encounter: Payer: BC Managed Care – PPO | Admitting: Physical Therapy

## 2020-04-05 ENCOUNTER — Telehealth: Payer: Self-pay | Admitting: Physical Therapy

## 2020-04-05 NOTE — Telephone Encounter (Signed)
Pt did not present for todays treatment.  Message left informing pt we don't have any other appointments scheduled.  Asked her to call if she feels like she needs to return.  If we do not hear from her in a week we will close her chart.  Roderic Scarce, PT 04/05/20 10:34 AM

## 2020-04-14 ENCOUNTER — Other Ambulatory Visit: Payer: Self-pay | Admitting: Medical-Surgical

## 2020-04-14 DIAGNOSIS — I10 Essential (primary) hypertension: Secondary | ICD-10-CM

## 2020-04-25 ENCOUNTER — Ambulatory Visit (INDEPENDENT_AMBULATORY_CARE_PROVIDER_SITE_OTHER): Payer: BC Managed Care – PPO | Admitting: Medical-Surgical

## 2020-04-25 ENCOUNTER — Other Ambulatory Visit: Payer: Self-pay

## 2020-04-25 ENCOUNTER — Encounter: Payer: Self-pay | Admitting: Medical-Surgical

## 2020-04-25 VITALS — BP 135/92 | HR 77 | Temp 97.3°F | Ht 65.5 in | Wt 210.5 lb

## 2020-04-25 DIAGNOSIS — H938X3 Other specified disorders of ear, bilateral: Secondary | ICD-10-CM | POA: Diagnosis not present

## 2020-04-25 DIAGNOSIS — L309 Dermatitis, unspecified: Secondary | ICD-10-CM

## 2020-04-25 DIAGNOSIS — Z6834 Body mass index (BMI) 34.0-34.9, adult: Secondary | ICD-10-CM | POA: Diagnosis not present

## 2020-04-25 MED ORDER — TRIAMCINOLONE ACETONIDE 0.5 % EX CREA: 1.0000 "application " | TOPICAL_CREAM | Freq: Two times a day (BID) | CUTANEOUS | 3 refills | Status: DC

## 2020-04-25 MED ORDER — AMBULATORY NON FORMULARY MEDICATION: 0 refills | Status: DC

## 2020-04-25 NOTE — Progress Notes (Signed)
Subjective:    CC: weight check, rash, ear fullness  HPI: Very pleasant 39 year old female presenting today for weight check, dermatitis, and bilateral ear fullness.  Weight check- has been taking Phentermine 8mg  daily with Topamax 100mg  daily. Tolerating both medications well without side effects. Notes that Topamax has definitely helped with headaches/migraines. Continues to increase activity (swimming, walking, resistance bands) and is making healthier dietary choices. Admits to celebrating over the holiday weekend and overeating with increased alcohol consumption. Weighs herself once weekly at home. Has a new scale and notes that depending on where it is placed on the floor, it gives different numbers.   Dermatitis- developed a rash in between her breasts that started with small blisters. Rash extended up the left side of the upper chest. Originally, she thought it was shingles as she has had this before but the rash has spread across the right upper chest and down onto her upper back. Rash is very itchy and no longer blistered. She has used calamine lotion, benadryl cream, and OTC hydrocortisone cream. Notes benadryl cream burns when applied. Hydrocortisone cream did relieve itching. No new chemical/cosmetic exposures. No environmental exposures to irritants.  Bilateral ear fullness- notes both ears have felt full with "fuzzy" hearing recently. No pain, drainage, ringing in the ears. No sinus congestion or other upper respiratory symptoms.   I reviewed the past medical history, family history, social history, surgical history, and allergies today and no changes were needed.  Please see the problem list section below in epic for further details.  Past Medical History: Past Medical History:  Diagnosis Date  . Anxiety   . Depression   . DVT (deep venous thrombosis) ([facility])   . Hypertension   . Migraine   . Pap smear abnormality of cervix/human papillomavirus (HPV) positive   . Seizures  ([facility])   . Stroke [facility])    Past Surgical History: Past Surgical History:  Procedure Laterality Date  . CARPAL TUNNEL RELEASE    . CESAREAN SECTION    . INTERSTIM IMPLANT PLACEMENT    . tenze    . TUBAL LIGATION     Social History: Social History   Socioeconomic History  . Marital status: Single    Spouse name: Not on file  . Number of children: Not on file  . Years of education: Not on file  . Highest education level: Not on file  Occupational History  . Not on file  Tobacco Use  . Smoking status: Former  . Smokeless tobacco: Never Used  Vaping Use  . Vaping Use: Never used  Substance and Sexual Activity  . Alcohol use: Yes    Alcohol/week: 3.0 standard drinks    Types: 3 Glasses of wine per week  . Drug use: Never  . Sexual activity: Yes    Partners: Male    Birth control/protection: Surgical  Other Topics Concern  . Not on file  Social History Narrative  . Not on file   Social Determinants of Health   Financial Resource Strain:   . Difficulty of Paying Living Expenses:   Food Insecurity:   . Worried About [facility] in the Last Year:   . Games developer in the Last Year:   Transportation Needs:   . Programme researcher, broadcasting/film/video (Medical):   Barista Lack of Transportation (Non-Medical):   Physical Activity:   . Days of Exercise per Week:   . Minutes of Exercise per Session:   Stress:   . Feeling of  Stress :   Social Connections:   . Frequency of Communication with Friends and Family:   . Frequency of Social Gatherings with Friends and Family:   . Attends Religious Services:   . Active Member of Clubs or Organizations:   . Attends Banker Meetings:   Marland Kitchen Marital Status:    Family History: Family History  Problem Relation Age of Onset  . Hypertension Mother   . Hypertension Father   . Clotting disorder Father   . Diabetes Father   . Deep vein thrombosis Father   . Breast cancer Maternal Aunt   . Factor V Leiden deficiency Maternal  Aunt   . Deep vein thrombosis Paternal Aunt   . Stroke Maternal Grandmother   . Prostate cancer Maternal Grandfather   . Stroke Paternal Grandfather   . Colon cancer Paternal Grandfather   . Breast cancer Maternal Great-grandmother    Allergies: Allergies  Allergen Reactions  . Chocolate Anaphylaxis  . Cocoa Butter Anaphylaxis  . Codeine Hives and Itching  . Propoxyphene Itching  . Sulfa Antibiotics     Yeast infection  . Lisinopril Cough   Medications: See med rec.  Review of Systems: No fevers, chills, night sweats, weight loss, chest pain, or shortness of breath.   Objective:    General: Well Developed, well nourished, and in no acute distress.  Neuro: Alert and oriented x3.  HEENT: Normocephalic, atraumatic. Bilateral TMs normal without erythema. External ears patent bilaterally without cerumen impaction. Skin: Warm and dry. Minimally erythematous scattered maculopapular rash over the bilateral upper chest, shoulder, and upper back areas. No vesicles, pustules, or breaks in skin noted. Cardiac: Regular rate and rhythm, no murmurs rubs or gallops, no lower extremity edema.  Respiratory: Clear to auscultation bilaterally. Not using accessory muscles, speaking in full sentences.   Impression and Recommendations:    1. Dermatitis Triamcinolone cream BID to the affected area. Evaluate diet and product use to help identify potential irritants. May use oatmeal lotions as these are soothing. Continue calamine lotion as needed. May use Benadryl or other OTC antihistamine.  - triamcinolone cream (KENALOG) 0.5 %; Apply 1 application topically 2 (two) times daily. To affected areas.  Dispense: 30 g; Refill: 3  2. BMI 34.0-34.9,adult Not having great results with Phentermine/Topamax combination. Wants to continue Topamax for headache/migraine prophylaxis. Interested in other weight loss options. Discussed semaglutide and it's recent approval for weight loss. Patient is interested in  switching to [facility]. Printed prescription and discount card paperwork/instructions provided. Explained dosing increase process and follow up on the new medication. Advised that she may not see immediate results in the first month or so due to the need to increase the dose slowly. Patient verbalized understanding and is agreeable to the plan. Continue lifestyle and dietary modifications. - AMBULATORY NON FORMULARY MEDICATION; Medication Name: Semaglutide 0.25mg /0.62ml give 0.25mg  into the skin once weekly x 4 weeks.  Dispense: 4 pen; Refill: 0  3. Ear fullness, bilateral Ears look okay bilaterally. Suspect she may have a bit of eustachian tube dysfunction causing the fullness. Recommend starting daily Flonase to see if this will help.  Return in about 4 weeks (around 05/23/2020) for weight check. ___________________________________________ Thayer Ohm, DNP, APRN, FNP-BC Primary Care and Sports Medicine [facility] [place]

## 2020-05-02 ENCOUNTER — Other Ambulatory Visit: Payer: Self-pay | Admitting: Sports Medicine

## 2020-05-02 DIAGNOSIS — S99912A Unspecified injury of left ankle, initial encounter: Secondary | ICD-10-CM

## 2020-05-17 ENCOUNTER — Encounter: Payer: Self-pay | Admitting: Medical-Surgical

## 2020-05-17 ENCOUNTER — Ambulatory Visit (INDEPENDENT_AMBULATORY_CARE_PROVIDER_SITE_OTHER): Payer: BC Managed Care – PPO | Admitting: Medical-Surgical

## 2020-05-17 VITALS — BP 120/87 | HR 79 | Temp 98.1°F | Ht 65.5 in | Wt 209.3 lb

## 2020-05-17 DIAGNOSIS — N921 Excessive and frequent menstruation with irregular cycle: Secondary | ICD-10-CM

## 2020-05-17 LAB — CBC
HCT: 39.9 % (ref 35.0–45.0)
Hemoglobin: 14.1 g/dL (ref 11.7–15.5)
MCH: 32.8 pg (ref 27.0–33.0)
MCHC: 35.3 g/dL (ref 32.0–36.0)
MCV: 92.8 fL (ref 80.0–100.0)
MPV: 10.6 fL (ref 7.5–12.5)
Platelets: 274 10*3/uL (ref 140–400)
RBC: 4.3 10*6/uL (ref 3.80–5.10)
RDW: 12.6 % (ref 11.0–15.0)
WBC: 6.4 10*3/uL (ref 3.8–10.8)

## 2020-05-17 MED ORDER — MEDROXYPROGESTERONE ACETATE 10 MG PO TABS: 10.0000 mg | ORAL_TABLET | Freq: Every day | ORAL | 0 refills | Status: DC

## 2020-05-17 NOTE — Progress Notes (Addendum)
Subjective:    CC: Heavy menstrual bleeding  HPI: Pleasant 39 year old female presenting today with reports of heavy menstrual bleeding starting on 04/25/2020.  Notes that she has had to use both pads and tampons due to the heaviness of her flow.  Has been passing clots daily.  Reports she has been experiencing cramping and breast tenderness that is normal for her.  For the last several days, she has experienced lightheadedness in the morning that seems to resolve once she has eaten.  She is sexually active in a monogamous relationship with her husband of several years.  She has not had intercourse since her period started.  Last Pap smear 4 years ago when her child was born was abnormal but they repeated at 6 months later with normal results.  Notes that she does have some periods of chills alternating with feeling hot.  Her mother experienced menopause in her late 41s early 69s.  Denies fever, chills, chest pain, shortness of breath, abdominal pain.  I reviewed the past medical history, family history, social history, surgical history, and allergies today and no changes were needed.  Please see the problem list section below in epic for further details.  Past Medical History: Past Medical History:  Diagnosis Date   Anxiety    Depression    DVT (deep venous thrombosis) ([facility])    Hypertension    Migraine    Pap smear abnormality of cervix/human papillomavirus (HPV) positive    Seizures ([facility])    Stroke [facility])    Past Surgical History: Past Surgical History:  Procedure Laterality Date   CARPAL TUNNEL RELEASE     CESAREAN SECTION     INTERSTIM IMPLANT PLACEMENT     tenze     TUBAL LIGATION     Social History: Social History   Socioeconomic History   Marital status: Single    Spouse name: Not on file   Number of children: Not on file   Years of education: Not on file   Highest education level: Not on file  Occupational History   Not on file  Tobacco Use    Smoking status: Former Smoker   Smokeless tobacco: Never Used  Building services engineer Use: Never used  Substance and Sexual Activity   Alcohol use: Yes    Alcohol/week: 3.0 standard drinks    Types: 3 Glasses of wine per week   Drug use: Never   Sexual activity: Yes    Partners: Male    Birth control/protection: Surgical  Other Topics Concern   Not on file  Social History Narrative   Not on file   Social Determinants of Health   Financial Resource Strain:    Difficulty of Paying Living Expenses:   Food Insecurity:    Worried About Programme researcher, broadcasting/film/video in the Last Year:    Barista in the Last Year:   Transportation Needs:    Freight forwarder (Medical):    Lack of Transportation (Non-Medical):   Physical Activity:    Days of Exercise per Week:    Minutes of Exercise per Session:   Stress:    Feeling of Stress :   Social Connections:    Frequency of Communication with Friends and Family:    Frequency of Social Gatherings with Friends and Family:    Attends Religious Services:    Active Member of Clubs or Organizations:    Attends Banker Meetings:    Marital Status:    Family  History: Family History  Problem Relation Age of Onset   Hypertension Mother    Hypertension Father    Clotting disorder Father    Diabetes Father    Deep vein thrombosis Father    Breast cancer Maternal Aunt    Factor V Leiden deficiency Maternal Aunt    Deep vein thrombosis Paternal Aunt    Stroke Maternal Grandmother    Prostate cancer Maternal Grandfather    Stroke Paternal Grandfather    Colon cancer Paternal Grandfather    Breast cancer Maternal Great-grandmother    Allergies: Allergies  Allergen Reactions   Chocolate Anaphylaxis   Cocoa Butter Anaphylaxis   Codeine Hives and Itching   Propoxyphene Itching   Sulfa Antibiotics     Yeast infection   Lisinopril Cough   Medications: See med rec.  Review of Systems:  No fevers, chills, night sweats, weight loss, chest pain, or shortness of breath.   Objective:    General: Well Developed, well nourished, and in no acute distress.  Neuro: Alert and oriented x3.  HEENT: Normocephalic, atraumatic.  Skin: Warm and dry. Cardiac: Regular rate and rhythm, no murmurs rubs or gallops, no lower extremity edema.  Respiratory: Clear to auscultation bilaterally. Not using accessory muscles, speaking in full sentences. Pelvic exam: VULVA: normal appearing vulva with no masses, tenderness or lesions, VAGINA: vaginal discharge - bloody, CERVIX: cervical discharge present - bloody, No vaginal tears or cervical polyps visualized. Exam limited by menstrual flow. Exam chaperoned by Arvilla Market, MA.  Impression and Recommendations:    1. Menorrhagia with irregular cycle To date, 22 days of heavy menstrual bleeding.  With presence of occasional lightheadedness, especially in the morning we will check a CBC.  No concern for pregnancy with tubal ligation.  Low concern for STIs as she is in a monogamous relationship with her husband.  We will do medroxyprogesterone 10 mg daily x10 days and a progesterone challenge.  Also scheduling for a pelvic ultrasound to evaluate for causes of prolonged bleeding including fibroids and polyps.  Advised patient that if her bleeding has not reduced or subsided by Monday, reached out and we can re-evaluate dosage of medroxyprogesterone. - CBC - medroxyPROGESTERone (PROVERA) 10 MG tablet; Take 1 tablet (10 mg total) by mouth daily.  Dispense: 10 tablet; Refill: 0 - US Pelvic Complete With Transvaginal; Future  Return if symptoms worsen or fail to improve. ___________________________________________ Thayer Ohm, DNP, APRN, FNP-BC Primary Care and Sports Medicine [facility] [place]

## 2020-05-17 NOTE — Patient Instructions (Signed)
Menorrhagia Menorrhagia is when your menstrual periods are heavy or last longer than normal. Follow these instructions at home: Medicines   Take over-the-counter and prescription medicines exactly as told by your doctor. This includes iron pills.  Do not change or switch medicines without asking your doctor.  Do not take aspirin or medicines that contain aspirin 1 week before or during your period. Aspirin may make bleeding worse. General instructions  If you need to change your pad or tampon more than once every 2 hours, limit your activity until the bleeding stops.  Iron pills can cause problems when pooping (constipation). To prevent or treat pooping problems while taking prescription iron pills, your doctor may suggest that you: ? Drink enough fluid to keep your pee (urine) clear or pale yellow. ? Take over-the-counter or prescription medicines. ? Eat foods that are high in fiber. These foods include:  Fresh fruits and vegetables.  Whole grains.  Beans. ? Limit foods that are high in fat and processed sugars. This includes fried and sweet foods.  Eat healthy meals and foods that are high in iron. Foods that have a lot of iron include: ? Leafy green vegetables. ? Meat. ? Liver. ? Eggs. ? Whole grain breads and cereals.  Do not try to lose weight until your heavy bleeding has stopped and you have normal amounts of iron in your blood. If you need to lose weight, work with your doctor.  Keep all follow-up visits as told by your doctor. This is important. Contact a doctor if:  You soak through a pad or tampon every 1 or 2 hours, and this happens every time you have a period.  You need to use pads and tampons at the same time because you are bleeding so much.  You are taking medicine and you: ? Feel sick to your stomach (nauseous). ? Throw up (vomit). ? Have watery poop (diarrhea).  You have other problems that may be related to the medicine you are taking. Get help  right away if:  You soak through more than a pad or tampon in 1 hour.  You pass clots bigger than 1 inch (2.5 cm) wide.  You feel short of breath.  You feel like your heart is beating too fast.  You feel dizzy or you pass out (faint).  You feel very weak or tired. Summary  Menorrhagia is when your menstrual periods are heavy or last longer than normal.  Take over-the-counter and prescription medicines exactly as told by your doctor. This includes iron pills.  Contact a doctor if you soak through more than a pad or tampon in 1 hour or are passing large clots. This information is not intended to replace advice given to you by your health care provider. Make sure you discuss any questions you have with your health care provider. Document Revised: 01/13/2018 Document Reviewed: 10/27/2016 Elsevier Patient Education  2020 [employer]  

## 2020-05-22 ENCOUNTER — Ambulatory Visit (INDEPENDENT_AMBULATORY_CARE_PROVIDER_SITE_OTHER): Payer: BC Managed Care – PPO

## 2020-05-22 ENCOUNTER — Other Ambulatory Visit: Payer: Self-pay

## 2020-05-22 DIAGNOSIS — N921 Excessive and frequent menstruation with irregular cycle: Secondary | ICD-10-CM

## 2020-05-22 DIAGNOSIS — N920 Excessive and frequent menstruation with regular cycle: Secondary | ICD-10-CM | POA: Diagnosis not present

## 2020-05-22 DIAGNOSIS — N854 Malposition of uterus: Secondary | ICD-10-CM | POA: Diagnosis not present

## 2020-05-23 ENCOUNTER — Ambulatory Visit: Payer: BC Managed Care – PPO | Admitting: Medical-Surgical

## 2020-05-24 ENCOUNTER — Ambulatory Visit: Payer: BC Managed Care – PPO | Admitting: Medical-Surgical

## 2020-06-19 ENCOUNTER — Other Ambulatory Visit: Payer: BC Managed Care – PPO

## 2020-09-04 ENCOUNTER — Ambulatory Visit (INDEPENDENT_AMBULATORY_CARE_PROVIDER_SITE_OTHER): Payer: BC Managed Care – PPO | Admitting: Medical-Surgical

## 2020-09-04 ENCOUNTER — Other Ambulatory Visit: Payer: Self-pay

## 2020-09-04 ENCOUNTER — Encounter: Payer: Self-pay | Admitting: Medical-Surgical

## 2020-09-04 VITALS — BP 132/80 | HR 81 | Temp 98.7°F | Ht 65.5 in | Wt 224.4 lb

## 2020-09-04 DIAGNOSIS — R52 Pain, unspecified: Secondary | ICD-10-CM

## 2020-09-04 DIAGNOSIS — F411 Generalized anxiety disorder: Secondary | ICD-10-CM

## 2020-09-04 DIAGNOSIS — Z6836 Body mass index (BMI) 36.0-36.9, adult: Secondary | ICD-10-CM

## 2020-09-04 DIAGNOSIS — G43109 Migraine with aura, not intractable, without status migrainosus: Secondary | ICD-10-CM | POA: Diagnosis not present

## 2020-09-04 DIAGNOSIS — Z23 Encounter for immunization: Secondary | ICD-10-CM | POA: Diagnosis not present

## 2020-09-04 DIAGNOSIS — H938X3 Other specified disorders of ear, bilateral: Secondary | ICD-10-CM

## 2020-09-04 MED ORDER — MELOXICAM 15 MG PO TABS: ORAL_TABLET | ORAL | 1 refills | Status: DC

## 2020-09-04 MED ORDER — SEMAGLUTIDE-WEIGHT MANAGEMENT 1.7 MG/0.75ML ~~LOC~~ SOAJ: 1.7000 mg | SUBCUTANEOUS | 0 refills | Status: AC

## 2020-09-04 MED ORDER — SEMAGLUTIDE-WEIGHT MANAGEMENT 0.25 MG/0.5ML ~~LOC~~ SOAJ: 0.2500 mg | SUBCUTANEOUS | 0 refills | Status: AC

## 2020-09-04 MED ORDER — CLONAZEPAM 0.5 MG PO TABS: 0.5000 mg | ORAL_TABLET | Freq: Two times a day (BID) | ORAL | 0 refills | Status: DC | PRN

## 2020-09-04 MED ORDER — SEMAGLUTIDE-WEIGHT MANAGEMENT 2.4 MG/0.75ML ~~LOC~~ SOAJ: 2.4000 mg | SUBCUTANEOUS | 1 refills | Status: AC

## 2020-09-04 MED ORDER — TOPIRAMATE 200 MG PO TABS: 100.0000 mg | ORAL_TABLET | Freq: Every day | ORAL | 1 refills | Status: DC

## 2020-09-04 MED ORDER — SEMAGLUTIDE-WEIGHT MANAGEMENT 1 MG/0.5ML ~~LOC~~ SOAJ
1.0000 mg | SUBCUTANEOUS | 0 refills | Status: AC
Start: 2020-11-01 — End: 2020-11-29

## 2020-09-04 MED ORDER — SEMAGLUTIDE-WEIGHT MANAGEMENT 0.5 MG/0.5ML ~~LOC~~ SOAJ: 0.5000 mg | SUBCUTANEOUS | 0 refills | Status: AC

## 2020-09-04 NOTE — Progress Notes (Signed)
Subjective:    CC: go over med list, follow up  HPI: Pleasant 39 year old female presenting for follow up on the following:  Anxiety/depression- taking Lexapro 20mg  daily, tolerating well without side effects. Feels the medication is helpful but does continue to have some depression/anxious symptoms. Using Klonopin 0.5mg  as needed sparingly. Has had significant life changes lately with the loss of her best friend from COVID and her uncle's sudden death in a car accident. 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 This understandably has led to more anxiety as she has tried to cope. Denies SI/HI.   Migraines- taking Topamax 100mg  daily, tolerating well without side effects. Feels this has helped to reduce her migraine frequency but still has 1 bad migraine a week. Used to take 200mg  of Topamax daily which she felt was more helpful. Would like to increase her dose to 200mg  to see if we can further reduce her headache frequency.   Generalized body aches- started on Meloxicam with her foot injury and notes it helps with her generalized aches and pains. Was taking 15mg  daily, tolerating well. Ran out of refills and has not been able to get the medication. Would like to continue this if possible.   Bilateral ears- having pressure in bilateral ears with popping but no hearing loss or tinnitus. No upper respiratory illness symptoms. Does not take a daily antihistamine or use a nasal spray. At times, notes that she feels like she is hearing underwater but not all the time.   Weight loss- previously switched from Phentermine to [facility] but reports she had significant difficulty locating the medication as well as getting her pharmacy to use the discount card. Has gained back greater than 10lbs despite maintaining her diet and activity levels. Is very worried about her weight and wants to know what the options are for weight loss.   I reviewed the  past medical history, family history, social history, surgical history, and allergies today and no changes were needed.  Please see the problem list section below in epic for further details.  Past Medical History: Past Medical History:  Diagnosis Date  . Anxiety   . Depression   . DVT (deep venous thrombosis) (HCC)   . Hypertension   . Migraine   . Pap smear abnormality of cervix/human papillomavirus (HPV) positive   . Seizures (HCC)   . Stroke [facility])    Past Surgical History: Past Surgical History:  Procedure Laterality Date  . CARPAL TUNNEL RELEASE    . CESAREAN SECTION    . INTERSTIM IMPLANT PLACEMENT    . tenze    . TUBAL LIGATION     Social History: Social History   Socioeconomic History  . Marital status: Single    Spouse name: Not on file  . Number of children: Not on file  . Years of education: Not on file  . Highest education level: Not on file  Occupational History  . Not on file  Tobacco Use  . Smoking status: Former  . Smokeless tobacco: Never Used  Vaping Use  . Vaping Use: Never used  Substance and Sexual Activity  . Alcohol use: Yes    Alcohol/week: 3.0 standard drinks    Types: 3 Glasses of wine per week  . Drug use: Never  . Sexual activity: Yes    Partners: Male    Birth control/protection: Surgical  Other Topics Concern  . Not on file  Social History Narrative  . Not on file   Social Determinants of Health   Financial Resource Strain:   . Difficulty of Paying Living Expenses: Not on file  Food Insecurity:   . Worried About Programme researcher, broadcasting/film/video in the Last Year: Not on file  . Ran Out of Food in the Last Year: Not on file  Transportation Needs:   . Lack of Transportation (Medical): Not on file  . Lack of Transportation (Non-Medical): Not on file  Physical Activity:   . Days of Exercise per Week: Not on file  . Minutes of Exercise per Session: Not on file  Stress:   . Feeling of Stress : Not on file  Social Connections:   .  Frequency of Communication with Friends and Family: Not on file  . Frequency of Social Gatherings with Friends and Family: Not on file  . Attends Religious Services: Not on file  . Active Member of Clubs or Organizations: Not on file  . Attends Banker Meetings: Not on file  . Marital Status: Not on file   Family History: Family History  Problem Relation Age of Onset  . Hypertension Mother   . Hypertension Father   . Clotting disorder Father   . Diabetes Father   . Deep vein thrombosis Father   . Breast cancer Maternal Aunt   . Factor V Leiden deficiency Maternal Aunt   . Deep vein thrombosis Paternal Aunt   . Stroke Maternal Grandmother   . Prostate cancer Maternal Grandfather   . Stroke Paternal Grandfather   . Colon cancer Paternal Grandfather   . Breast cancer Maternal Great-grandmother    Allergies: Allergies  Allergen Reactions  . Chocolate Anaphylaxis  . Cocoa Butter Anaphylaxis  . Codeine Hives and Itching  . Propoxyphene Itching  . Sulfa Antibiotics     Yeast infection  . Lisinopril Cough   Medications: See med rec.  Review of Systems: See HPI for pertinent positives and negatives.   Objective:    General: Well Developed, well nourished, and in no acute distress.  Neuro: Alert and oriented x3.  HEENT: Normocephalic, atraumatic. Bilateral external ear canals injected without exudate. TM's normal without erythema, bulging, or drainage. Skin: Warm and dry. Cardiac: Regular rate and rhythm, no murmurs rubs or gallops, no lower extremity edema.  Respiratory: Clear to auscultation bilaterally. Not using accessory muscles, speaking in full sentences.  Impression and Recommendations:    1. Class 2 severe obesity due to excess calories with serious comorbidity and body mass index (BMI) of 36.0 to 36.9 in adult [facility]) Rewriting for [facility] starting at 0.25mg  weekly and titrating to next dose every 4 weeks. Sending to [facility] in [place] on [address] as we know they are aware of [employer] discount cards and have the supply. Advised patient to reach out to her previous pharmacy for the discount card information. If this is unsuccessful, she may benefit from contacting the makers of [employer] to see if they have that information available. If not, they may be able to deactivate her previous discount card so she can reactivate a new one. If this is unsuccessful and she is unable to obtain the [facility] on the card, recommend considering Saxenda.   2. GAD (generalized anxiety disorder) Continue Lexapro 20mg  daily. Continue very spring use of Klonopin prn.  - clonazePAM (KLONOPIN) 0.5 MG tablet; Take 1 tablet (0.5 mg total) by mouth 2 (two) times daily as needed.  Dispense: 30 tablet; Refill: 0  3.  Migraine with aura and without status migrainosus, not intractable Increasing Topamax to 200mg  nightly.  - topiramate (TOPAMAX) 200 MG tablet; Take 0.5 tablets (100 mg total) by mouth daily.  Dispense: 90 tablet; Refill: 1  4. Body aches Continue Meloxicam 15mg  daily prn. - meloxicam (MOBIC) 15 MG tablet; ONE TABLET EVERY MORNING WITH A MEAL FOR 2 WEEKS, THEN DAILY AS NEEDED FOR PAIN.  Dispense: 90 tablet; Refill: 1  5. Ear fullness, bilateral Suspect eustachian tube dysfunction. Recommend daily oral antihistamine such as Zyrtec, Claritin, Allegra, or Xyzal. Also recommend daily Flonase.   6. Need for influenza vaccination Flu vaccine given in office.  - Flu Vaccine QUAD 36+ mos IM  Return in about 4 weeks (around 10/02/2020) for weight check. ___________________________________________ , DNP, APRN, FNP-BC Primary Care and Sports Medicine [facility]

## 2020-09-04 NOTE — Patient Instructions (Signed)

## 2020-09-21 ENCOUNTER — Other Ambulatory Visit: Payer: Self-pay | Admitting: Medical-Surgical

## 2020-09-21 DIAGNOSIS — K219 Gastro-esophageal reflux disease without esophagitis: Secondary | ICD-10-CM

## 2020-10-02 ENCOUNTER — Ambulatory Visit: Payer: BC Managed Care – PPO | Admitting: Medical-Surgical

## 2020-10-05 ENCOUNTER — Encounter: Payer: Self-pay | Admitting: Medical-Surgical

## 2020-11-12 ENCOUNTER — Other Ambulatory Visit: Payer: Self-pay | Admitting: Medical-Surgical

## 2020-11-12 DIAGNOSIS — F411 Generalized anxiety disorder: Secondary | ICD-10-CM

## 2020-11-12 DIAGNOSIS — N921 Excessive and frequent menstruation with irregular cycle: Secondary | ICD-10-CM

## 2020-11-12 NOTE — Telephone Encounter (Signed)
LVMTRC (1st attempt)    Per chart, pt is no longer taking medroxyprogesterone as of 09/04/2020. Need to find out if pt is still taking this med, and if so, when it was restarted.

## 2020-11-13 NOTE — Telephone Encounter (Signed)
LVMTRC (2nd attempt)   Per chart, pt is no longer taking medroxyprogesterone as of 09/04/2020. Need to find out if pt is still taking this med, and if so, when it was restarted.

## 2020-11-24 ENCOUNTER — Other Ambulatory Visit: Payer: Self-pay | Admitting: Medical-Surgical

## 2020-11-24 DIAGNOSIS — I10 Essential (primary) hypertension: Secondary | ICD-10-CM

## 2020-12-29 ENCOUNTER — Other Ambulatory Visit: Payer: Self-pay | Admitting: Medical-Surgical

## 2020-12-29 DIAGNOSIS — F411 Generalized anxiety disorder: Secondary | ICD-10-CM

## 2021-02-18 ENCOUNTER — Other Ambulatory Visit: Payer: Self-pay | Admitting: Medical-Surgical

## 2021-02-18 ENCOUNTER — Encounter: Payer: Self-pay | Admitting: Medical-Surgical

## 2021-02-18 DIAGNOSIS — F411 Generalized anxiety disorder: Secondary | ICD-10-CM

## 2021-02-18 MED ORDER — RIZATRIPTAN BENZOATE 10 MG PO TBDP: 10.0000 mg | ORAL_TABLET | ORAL | 0 refills | Status: DC | PRN

## 2021-02-19 ENCOUNTER — Ambulatory Visit (INDEPENDENT_AMBULATORY_CARE_PROVIDER_SITE_OTHER): Payer: BC Managed Care – PPO | Admitting: Medical-Surgical

## 2021-02-19 ENCOUNTER — Other Ambulatory Visit: Payer: Self-pay

## 2021-02-19 ENCOUNTER — Encounter: Payer: Self-pay | Admitting: Medical-Surgical

## 2021-02-19 VITALS — BP 135/87 | HR 88 | Temp 98.5°F | Ht 65.5 in | Wt 232.2 lb

## 2021-02-19 DIAGNOSIS — F411 Generalized anxiety disorder: Secondary | ICD-10-CM

## 2021-02-19 DIAGNOSIS — I10 Essential (primary) hypertension: Secondary | ICD-10-CM

## 2021-02-19 DIAGNOSIS — Z6836 Body mass index (BMI) 36.0-36.9, adult: Secondary | ICD-10-CM

## 2021-02-19 DIAGNOSIS — G43109 Migraine with aura, not intractable, without status migrainosus: Secondary | ICD-10-CM | POA: Diagnosis not present

## 2021-02-19 DIAGNOSIS — K219 Gastro-esophageal reflux disease without esophagitis: Secondary | ICD-10-CM

## 2021-02-19 DIAGNOSIS — R52 Pain, unspecified: Secondary | ICD-10-CM

## 2021-02-19 MED ORDER — LOSARTAN POTASSIUM-HCTZ 50-12.5 MG PO TABS: 1.0000 | ORAL_TABLET | Freq: Every day | ORAL | 1 refills | Status: DC

## 2021-02-19 MED ORDER — MELOXICAM 15 MG PO TABS: ORAL_TABLET | ORAL | 1 refills | Status: DC

## 2021-02-19 MED ORDER — ESCITALOPRAM OXALATE 20 MG PO TABS
20.0000 mg | ORAL_TABLET | Freq: Every day | ORAL | 0 refills | Status: DC
Start: 2021-02-19 — End: 2021-12-12

## 2021-02-19 MED ORDER — TOPIRAMATE 200 MG PO TABS: 200.0000 mg | ORAL_TABLET | Freq: Every day | ORAL | 1 refills | Status: DC

## 2021-02-19 MED ORDER — PANTOPRAZOLE SODIUM 40 MG PO TBEC: 1.0000 | DELAYED_RELEASE_TABLET | Freq: Every day | ORAL | 1 refills | Status: DC

## 2021-02-19 NOTE — Progress Notes (Signed)
Subjective:    CC: Med check  HPI: Pleasant 40 year old female presenting for the following:  Hypertension-Notes that her blood pressures been fighting a lot lately.  When she gets an elevated blood pressure reading, she begins to experience headaches and migraines with associated eye twitching, facial flushing, headaches, and blurry vision.  Endorses that she does occasionally get lower extremity edema, worse on the leg that was injured recently.  Taking losartan 25 mg daily and hydrochlorothiazide 25 mg daily.  Tolerating both medications well without side effects.  GERD-over the past 6 months, has had many stressful issues including her best friend remains death and her uncles passing.  Notes that her eating behaviors changed tremendously and she was comfort eating as well as drinking quite a bit more alcohol.  She is now starting to get back together and has reduced her alcohol consumption.  Making healthier dietary choices.  Taking Protonix 40 mg daily, tolerating well without side effects.  When she is following her normal diet, this is very helpful.  Mood-the last 6 months have been very difficult for her but she is now coming around.  She is taking Lexapro 20 mg daily and using clonazepam 0.5 mg sparingly for severe anxiety.  Denies SI/HI.  Migraine-taking Topamax 100 mg daily but still having some breakthrough migraines.  She tried Maxalt that was sent in yesterday which was somewhat helpful but did not fully resolve her migraine.  Migraines occur more frequently when her blood pressure and stress level are up.  I reviewed the past medical history, family history, social history, surgical history, and allergies today and no changes were needed.  Please see the problem list section below in epic for further details.  Past Medical History: Past Medical History:  Diagnosis Date  . Anxiety   . Depression   . DVT (deep venous thrombosis) (HCC)   . Hypertension   . Migraine   . Pap  smear abnormality of cervix/human papillomavirus (HPV) positive   . Seizures (HCC)   . Stroke [facility])    Past Surgical History: Past Surgical History:  Procedure Laterality Date  . CARPAL TUNNEL RELEASE    . CESAREAN SECTION    . INTERSTIM IMPLANT PLACEMENT    . tenze    . TUBAL LIGATION     Social History: Social History   Socioeconomic History  . Marital status: Single    Spouse name: Not on file  . Number of children: Not on file  . Years of education: Not on file  . Highest education level: Not on file  Occupational History  . Not on file  Tobacco Use  . Smoking status: Former Games developer  . Smokeless tobacco: Never Used  Vaping Use  . Vaping Use: Never used  Substance and Sexual Activity  . Alcohol use: Yes    Alcohol/week: 3.0 standard drinks    Types: 3 Glasses of wine per week  . Drug use: Never  . Sexual activity: Yes    Partners: Male    Birth control/protection: Surgical  Other Topics Concern  . Not on file  Social History Narrative  . Not on file   Social Determinants of Health   Financial Resource Strain: Not on file  Food Insecurity: Not on file  Transportation Needs: Not on file  Physical Activity: Not on file  Stress: Not on file  Social Connections: Not on file   Family History: Family History  Problem Relation Age of Onset  . Hypertension Mother   . Hypertension  Father   . Clotting disorder Father   . Diabetes Father   . Deep vein thrombosis Father   . Breast cancer Maternal Aunt   . Factor V Leiden deficiency Maternal Aunt   . Deep vein thrombosis Paternal Aunt   . Stroke Maternal Grandmother   . Prostate cancer Maternal Grandfather   . Stroke Paternal Grandfather   . Colon cancer Paternal Grandfather   . Breast cancer Maternal Great-grandmother    Allergies: Allergies  Allergen Reactions  . Chocolate Anaphylaxis  . Cocoa Butter Anaphylaxis  . Codeine Hives and Itching  . Propoxyphene Itching  . Sulfa Antibiotics     Yeast  infection  . Lisinopril Cough   Medications: See med rec.  Review of Systems: See HPI for pertinent positives and negatives.   Objective:    General: Well Developed, well nourished, and in no acute distress.  Neuro: Alert and oriented x3.  HEENT: Normocephalic, atraumatic.  Skin: Warm and dry. Cardiac: Regular rate and rhythm, no murmurs rubs or gallops, no lower extremity edema.  Respiratory: Clear to auscultation bilaterally. Not using accessory muscles, speaking in full sentences.   Impression and Recommendations:    1. Migraine with aura and without status migrainosus, not intractable Increase Topamax to 200 mg daily.  Continue to use Maxalt 10 mg ODT as needed. - topiramate (TOPAMAX) 200 MG tablet; Take 1 tablet (200 mg total) by mouth daily.  Dispense: 90 tablet; Refill: 1  2. Essential hypertension Blood pressure not at goal and she has had some readings that were far above goal.  Changing blood pressure medications today to losartan 50 mg with HCTZ 12.5 mg and a single tablet.  Continue to monitor blood pressure at home with goal of 130/80 or less.  Recommend low-sodium diet, exercise, and weight loss.    3. Class 2 severe obesity due to excess calories with serious comorbidity and body mass index (BMI) of 36.0 to 36.9 in adult [facility]) Increasing Topamax to 200 mg daily which may help with appetite suppression.  Once blood pressures are under control, we will look at restarting phentermine to help with weight loss. - topiramate (TOPAMAX) 200 MG tablet; Take 1 tablet (200 mg total) by mouth daily.  Dispense: 90 tablet; Refill: 1  4. Gastroesophageal reflux disease without esophagitis Continue pantoprazole 40 mg daily.  Resume dietary modifications to eliminate aggravating foods/drinks. - pantoprazole (PROTONIX) 40 MG tablet; Take 1 tablet (40 mg total) by mouth daily.  Dispense: 90 tablet; Refill: 1  5. Body aches Continue meloxicam 15 mg daily as needed. - meloxicam (MOBIC)  15 MG tablet; ONE TABLET EVERY MORNING WITH A MEAL FOR 2 WEEKS, THEN DAILY AS NEEDED FOR PAIN.  Dispense: 90 tablet; Refill: 1  6. GAD (generalized anxiety disorder) Continue Lexapro 20 mg daily.  Continue very sparing use of clonazepam 0.5 mg daily as needed. - escitalopram (LEXAPRO) 20 MG tablet; Take 1 tablet (20 mg total) by mouth daily.  Dispense: 90 tablet; Refill: 0  Return in about 2 weeks (around 03/05/2021) for nurse visit for BP check. ___________________________________________ Thayer Ohm, DNP, APRN, FNP-BC Primary Care and Sports Medicine [facility] [place]

## 2021-02-23 IMAGING — MR MR ANKLE*L* W/O CM
5 series · 40 of 40 positions shown · non-contrast
Comparison: Plain films left ankle 12/17/2019.

CLINICAL DATA: The patient suffered a left ankle fracture in a [place]ampoline park 12[place]17/2019. Numbness in the left toes. Initial
encounter.

EXAM:
MRI OF THE LEFT ANKLE WITHOUT CONTRAST
TECHNIQUE: Multiplanar, multisequence MR imaging of the ankle was performed. No
intravenous contrast was administered.

[Series 3: PD fat-sat · axial · 3.0mm · 0.70mm/px · z∈[+10,+145]mm · 10 of 42 slices shown]
[im 1/42]
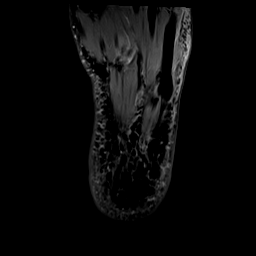
[im 5/42]
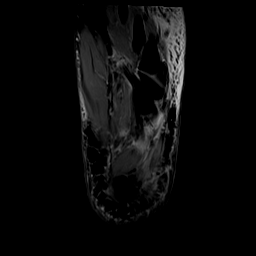
[im 10/42]
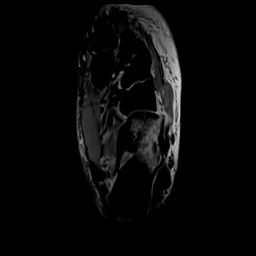
[im 14/42]
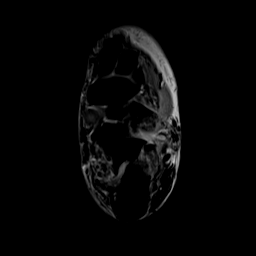
[im 19/42]
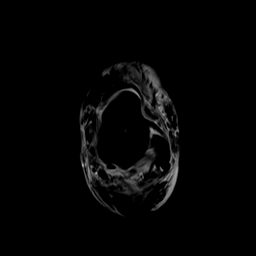
[im 23/42]
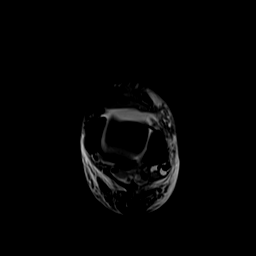
[im 28/42]
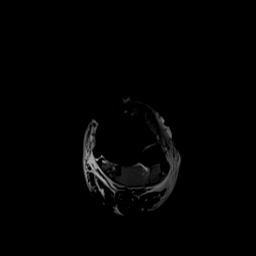
[im 32/42]
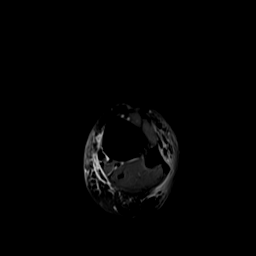
[im 37/42]
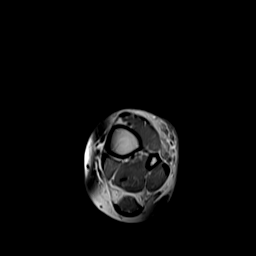
[im 42/42]
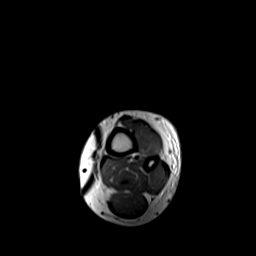

[Series 5: STIR · coronal · 3.0mm · 0.70mm/px · 11 of 45 slices shown (1 of 2)]
[im 1/45]
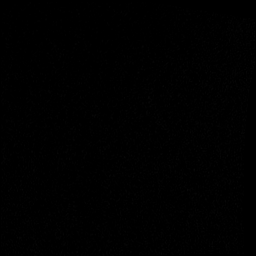
[im 5/45]
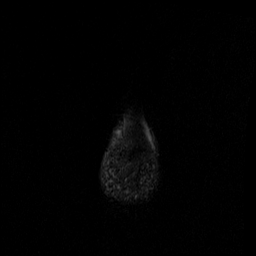
[im 9/45]
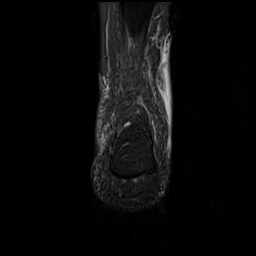
[im 14/45]
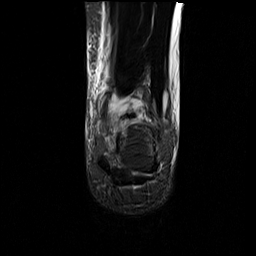
[im 18/45]
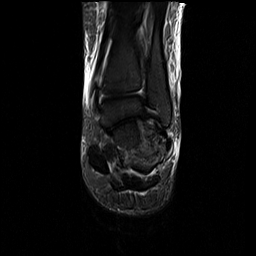
[im 23/45]
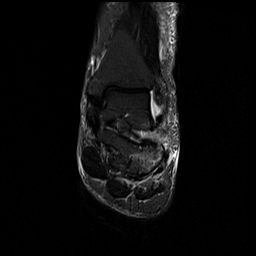
[im 27/45]
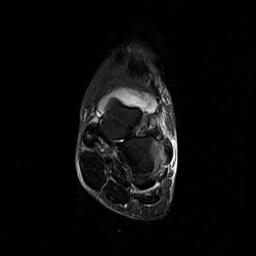
[im 31/45]
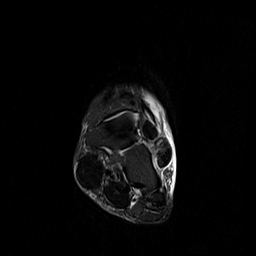
[im 36/45]
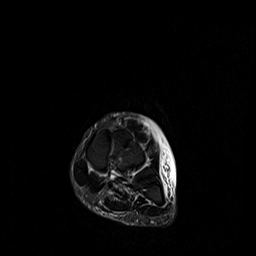
[im 40/45]
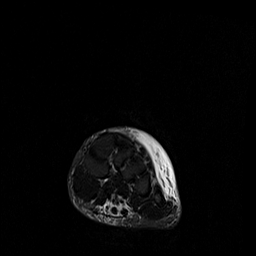
[im 45/45]
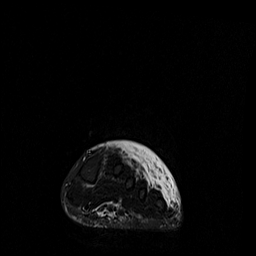

[Series 6: T1 · sagittal · 3.0mm · 0.56mm/px · 5 of 24 slices shown]
[im 1/24]
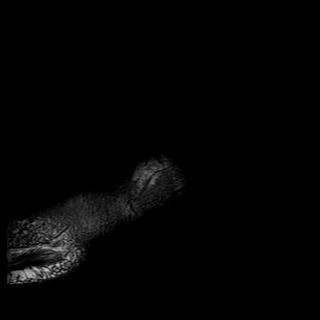
[im 6/24]
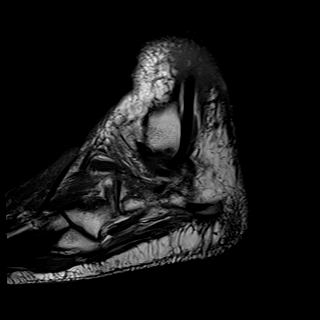
[im 12/24]
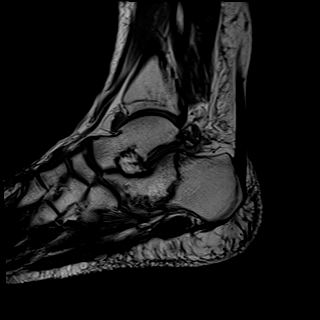
[im 18/24]
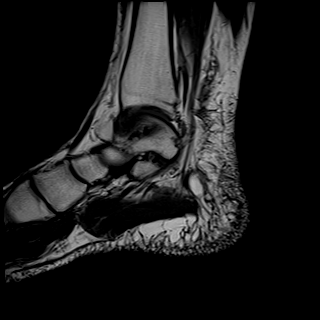
[im 24/24]
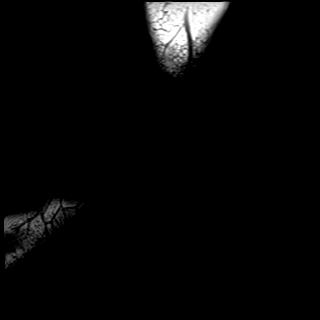

[Series 7: STIR · sagittal · 3.0mm · 0.70mm/px · 5 of 24 slices shown (2 of 2)]
[im 1/24]
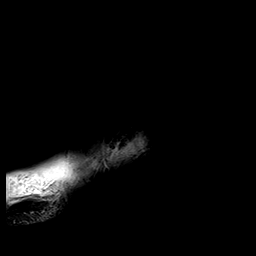
[im 6/24]
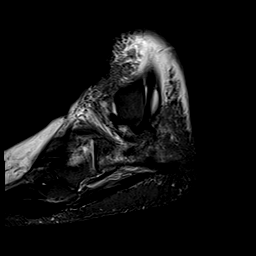
[im 12/24]
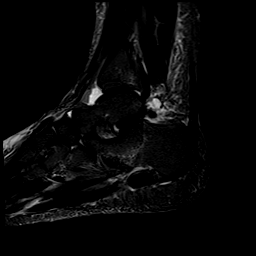
[im 18/24]
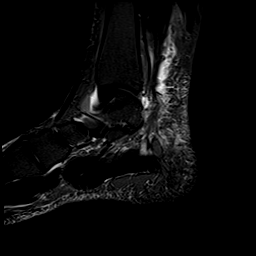
[im 24/24]
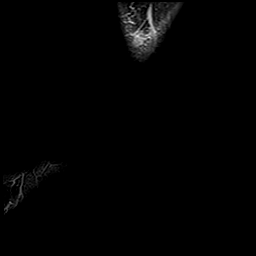

[Series 8: T2 fat-sat · axial · 3.0mm · 0.70mm/px · z∈[+10,+145]mm · 9 of 42 slices shown]
[im 1/42]
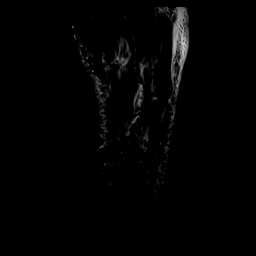
[im 6/42]
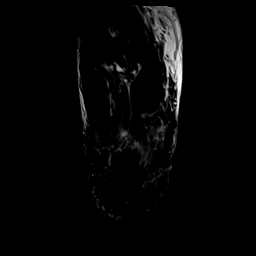
[im 11/42]
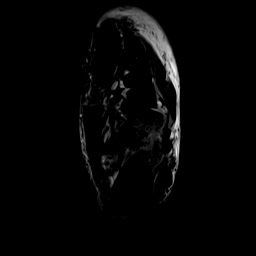
[im 16/42]
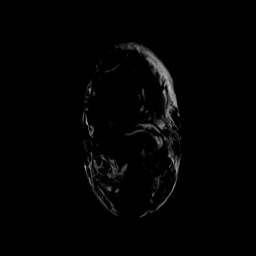
[im 21/42]
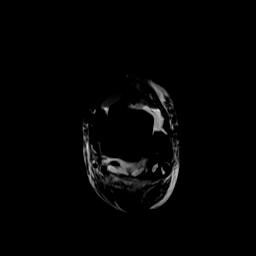
[im 26/42]
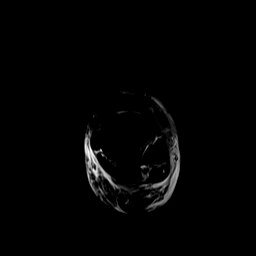
[im 31/42]
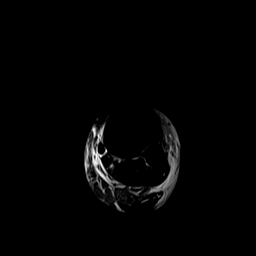
[im 36/42]
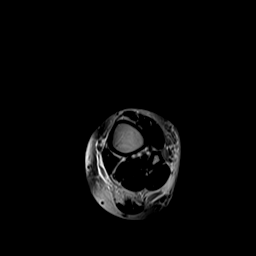
[im 42/42]
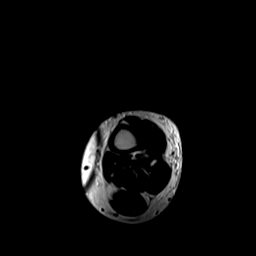

[40 of 40 positions shown; findings below may reference images not displayed]

FINDINGS: TENDONS

Peroneal: Intact.

Posteromedial: Intact.

Anterior: Intact.

Achilles: Intact.

Plantar Fascia: Normal.

LIGAMENTS

Lateral: The dorsal calcaneocuboid ligament is torn. Lateral
ligamentous structures are otherwise intact.

Medial: Intact.

CARTILAGE

Ankle Joint: There is a small joint effusion. No osteochondral
lesion of the talar dome.

Subtalar Joints/Sinus Tarsi: Normal.

Bones: The patient has a nondisplaced fracture of the calcaneus.
Main fracture line extends from the dorsal most aspect of the
calcaneus at the posterior facet of the subtalar joint and extends
inferiorly and anteriorly just proximal to the inferior articular
surface of the calcaneus at the calcaneocuboid joint. A small bone
contusion without fracture seen in the lateral periphery of the
tibia.

Other: None.
IMPRESSION: 1. Acute nondisplaced fracture of the calcaneus as described. Very
small contusion in the distal tibia also noted.
2. Tear of the dorsal calcaneocuboid ligament.

## 2021-03-05 ENCOUNTER — Ambulatory Visit (INDEPENDENT_AMBULATORY_CARE_PROVIDER_SITE_OTHER): Payer: BC Managed Care – PPO | Admitting: Medical-Surgical

## 2021-03-05 ENCOUNTER — Other Ambulatory Visit: Payer: Self-pay

## 2021-03-05 ENCOUNTER — Encounter: Payer: Self-pay | Admitting: Medical-Surgical

## 2021-03-05 VITALS — BP 124/80 | HR 88 | Temp 98.4°F | Ht 65.5 in | Wt 235.7 lb

## 2021-03-05 DIAGNOSIS — I10 Essential (primary) hypertension: Secondary | ICD-10-CM | POA: Diagnosis not present

## 2021-03-05 DIAGNOSIS — Z6838 Body mass index (BMI) 38.0-38.9, adult: Secondary | ICD-10-CM

## 2021-03-05 MED ORDER — PHENTERMINE HCL 37.5 MG PO TABS: ORAL_TABLET | ORAL | 0 refills | Status: DC

## 2021-03-05 NOTE — Progress Notes (Signed)
Subjective:    CC: Blood pressure follow-up, weight loss discussion  HPI: Pleasant 40 year old female presenting today for blood pressure follow-up.  She has been on her new dose of losartan with hydrochlorothiazide 50- 12.5 mg daily for the last couple of weeks.  Tolerating the medication well without side effects although she did note a headache for a couple of days when she first started it.  Blood pressures at home have been similar to our reading in the office today of 120s/80s.  Denies CP, SOB, palpitations, lower extremity edema, dizziness, headaches, or vision changes.  Has been taking Topamax 200 mg daily, tolerating well without side effects.  Notes that her appetite is significantly more decreased than it was previously but she would like to discuss restarting phentermine to help facilitate further weight loss.  She is very bothered by her weight of 235 pounds today and feels that it may be contributing to some of her other health concerns.  I reviewed the past medical history, family history, social history, surgical history, and allergies today and no changes were needed.  Please see the problem list section below in epic for further details.  Past Medical History: Past Medical History:  Diagnosis Date  . Anxiety   . Depression   . DVT (deep venous thrombosis) (HCC)   . Hypertension   . Migraine   . Pap smear abnormality of cervix/human papillomavirus (HPV) positive   . Seizures (HCC)   . Stroke [facility])    Past Surgical History: Past Surgical History:  Procedure Laterality Date  . CARPAL TUNNEL RELEASE    . CESAREAN SECTION    . INTERSTIM IMPLANT PLACEMENT    . tenze    . TUBAL LIGATION     Social History: Social History   Socioeconomic History  . Marital status: Single    Spouse name: Not on file  . Number of children: Not on file  . Years of education: Not on file  . Highest education level: Not on file  Occupational History  . Not on file  Tobacco Use  .  Smoking status: Former Games developer  . Smokeless tobacco: Never Used  Vaping Use  . Vaping Use: Never used  Substance and Sexual Activity  . Alcohol use: Yes    Alcohol/week: 3.0 standard drinks    Types: 3 Glasses of wine per week  . Drug use: Never  . Sexual activity: Yes    Partners: Male    Birth control/protection: Surgical  Other Topics Concern  . Not on file  Social History Narrative  . Not on file   Social Determinants of Health   Financial Resource Strain: Not on file  Food Insecurity: Not on file  Transportation Needs: Not on file  Physical Activity: Not on file  Stress: Not on file  Social Connections: Not on file   Family History: Family History  Problem Relation Age of Onset  . Hypertension Mother   . Hypertension Father   . Clotting disorder Father   . Diabetes Father   . Deep vein thrombosis Father   . Breast cancer Maternal Aunt   . Factor V Leiden deficiency Maternal Aunt   . Deep vein thrombosis Paternal Aunt   . Stroke Maternal Grandmother   . Prostate cancer Maternal Grandfather   . Stroke Paternal Grandfather   . Colon cancer Paternal Grandfather   . Breast cancer Maternal Great-grandmother    Allergies: Allergies  Allergen Reactions  . Chocolate Anaphylaxis  . Cocoa Butter Anaphylaxis  .  Codeine Hives and Itching  . Propoxyphene Itching  . Sulfa Antibiotics     Yeast infection  . Lisinopril Cough   Medications: See med rec.  Review of Systems: See HPI for pertinent positives and negatives.   Objective:    General: Well Developed, well nourished, and in no acute distress.  Neuro: Alert and oriented x3.  HEENT: Normocephalic, atraumatic.  Skin: Warm and dry. Cardiac: Regular rate and rhythm, no murmurs rubs or gallops, no lower extremity edema.  Respiratory: Clear to auscultation bilaterally. Not using accessory muscles, speaking in full sentences.  Impression and Recommendations:    1. Essential hypertension Continue losartan-HCTZ  50-12.5 mg daily as prescribed.  2. Class 2 severe obesity due to excess calories with serious comorbidity and body mass index (BMI) of 38.0 to 38.9 in adult [facility]) Continue Topamax 200 mg daily.  Restarting phentermine 37.5 mg daily as this was previously well-tolerated.  Advised to monitor blood pressure for increases due to the medication.  She will monitor her home readings for the next week or 2 and send those to me through MyChart to make sure we are treating her safely.  Recommend low calorie diet and regular intentional exercise.  Make sure to drink plenty of water to prevent constipation.  Monitor for any side effects of the phentermine and notify us if any questions or concerns arise.  Return in about 4 weeks (around 04/02/2021) for weight check. ___________________________________________ Thayer Ohm, DNP, APRN, FNP-BC Primary Care and Sports Medicine [facility]

## 2021-03-11 ENCOUNTER — Other Ambulatory Visit: Payer: Self-pay | Admitting: Medical-Surgical

## 2021-03-14 ENCOUNTER — Encounter: Payer: Self-pay | Admitting: Obstetrics and Gynecology

## 2021-03-14 ENCOUNTER — Other Ambulatory Visit (HOSPITAL_COMMUNITY)
Admission: RE | Admit: 2021-03-14 | Discharge: 2021-03-14 | Disposition: A | Discharge status: Home / Self Care | Payer: BC Managed Care – PPO | Source: Ambulatory Visit | Attending: Obstetrics and Gynecology | Admitting: Obstetrics and Gynecology

## 2021-03-14 ENCOUNTER — Ambulatory Visit (INDEPENDENT_AMBULATORY_CARE_PROVIDER_SITE_OTHER): Payer: BC Managed Care – PPO | Admitting: Obstetrics and Gynecology

## 2021-03-14 ENCOUNTER — Other Ambulatory Visit: Payer: Self-pay

## 2021-03-14 VITALS — BP 128/91 | HR 112 | Wt 230.0 lb

## 2021-03-14 DIAGNOSIS — Z01419 Encounter for gynecological examination (general) (routine) without abnormal findings: Secondary | ICD-10-CM | POA: Diagnosis not present

## 2021-03-14 MED ORDER — NORETHINDRONE 0.35 MG PO TABS: 1.0000 | ORAL_TABLET | Freq: Every day | ORAL | 11 refills | Status: DC

## 2021-03-14 NOTE — Progress Notes (Signed)
Subjective:     Linda [name] is a 40 y.o. female P2 with BMI 37 and LMP 4/26 presenting for a comprehensive physical exam. The patient reports no problems. Patient reports irregular heavy period. She states that she often skips months which is followed by a heavy and prolonged period. She reports a negative work up by PCP and Risk analyst with [facility]. She is without any other complaints. She is sexually active using BTL for contraception. She denies pelvic pain or abnormal discharge. She denies urinary incontinence. Patient is without any other complaints  Past Medical History:  Diagnosis Date  . Anxiety   . Depression   . DVT (deep venous thrombosis) (HCC)   . Hypertension   . Migraine   . Pap smear abnormality of cervix/human papillomavirus (HPV) positive   . Seizures (HCC)   . Stroke [facility])    Past Surgical History:  Procedure Laterality Date  . CARPAL TUNNEL RELEASE    . CESAREAN SECTION    . INTERSTIM IMPLANT PLACEMENT    . tenze    . TUBAL LIGATION     Family History  Problem Relation Age of Onset  . Hypertension Mother   . Hypertension Father   . Clotting disorder Father   . Diabetes Father   . Deep vein thrombosis Father   . Breast cancer Maternal Aunt   . Factor V Leiden deficiency Maternal Aunt   . Deep vein thrombosis Paternal Aunt   . Stroke Maternal Grandmother   . Prostate cancer Maternal Grandfather   . Stroke Paternal Grandfather   . Colon cancer Paternal Grandfather   . Breast cancer Maternal Great-grandmother     Social History   Socioeconomic History  . Marital status: Single    Spouse name: Not on file  . Number of children: Not on file  . Years of education: Not on file  . Highest education level: Not on file  Occupational History  . Not on file  Tobacco Use  . Smoking status: Former Games developer  . Smokeless tobacco: Never Used  Vaping Use  . Vaping Use: Never used  Substance and Sexual Activity  . Alcohol use: Yes    Alcohol/week: 3.0  standard drinks    Types: 3 Glasses of wine per week  . Drug use: Never  . Sexual activity: Yes    Partners: Male    Birth control/protection: Surgical  Other Topics Concern  . Not on file  Social History Narrative  . Not on file   Social Determinants of Health   Financial Resource Strain: Not on file  Food Insecurity: Not on file  Transportation Needs: Not on file  Physical Activity: Not on file  Stress: Not on file  Social Connections: Not on file  Intimate Partner Violence: Not on file   Health Maintenance  Topic Date Due  . COVID-19 Vaccine (1) Never done  . HIV Screening  Never done  . PAP SMEAR-Modifier  09/03/2019  . Hepatitis C Screening  04/25/2021 (Originally 09/24/2000)  . INFLUENZA VACCINE  05/20/2021  . TETANUS/TDAP  05/15/2026  . Zoster Vaccines- Shingrix (1 of 2) 09/24/2032  . HPV VACCINES  Aged Out       Review of Systems Pertinent items noted in HPI and remainder of comprehensive ROS otherwise negative.   Objective:  Blood pressure (!) 128/91, pulse (!) 112, weight 230 lb (104.3 kg), last menstrual period 02/12/2021.     GENERAL: Well-developed, well-nourished female in no acute distress.  HEENT: Normocephalic, atraumatic. Sclerae anicteric.  NECK: Supple. Normal thyroid.  LUNGS: Clear to auscultation bilaterally.  HEART: Regular rate and rhythm. BREASTS: Symmetric in size. No palpable masses or lymphadenopathy, skin changes, or nipple drainage. ABDOMEN: Soft, nontender, nondistended. No organomegaly. PELVIC: Normal external female genitalia. Vagina is pink and rugated.  Normal discharge. Normal appearing cervix. Uterus is normal in size.  No adnexal mass or tenderness. EXTREMITIES: No cyanosis, clubbing, or edema, 2+ distal pulses.    Assessment:    Healthy female exam.      Plan:    Pap smear collected Discussed medical management with progesterone only contraception such as POP, depo-provera and Mirena IUD. Patient opted for POP. Rx POP  provided for cycle control Patient will be contacted with abnormal results RTC in 1 year or prn See After Visit Summary for Counseling Recommendations

## 2021-03-14 NOTE — Progress Notes (Signed)
Pt c/o irregular, heavy bleeding  Last pap 2017- abnormal results. Pt went back for colpo and results were abnormal. Pt never went back for Leep

## 2021-03-19 LAB — CYTOLOGY - PAP
Adequacy: ABSENT
Comment: NEGATIVE
Diagnosis: NEGATIVE
High risk HPV: NEGATIVE

## 2021-03-29 ENCOUNTER — Other Ambulatory Visit: Payer: Self-pay | Admitting: Medical-Surgical

## 2021-03-29 DIAGNOSIS — I10 Essential (primary) hypertension: Secondary | ICD-10-CM

## 2021-04-02 ENCOUNTER — Other Ambulatory Visit: Payer: Self-pay

## 2021-04-02 ENCOUNTER — Ambulatory Visit (INDEPENDENT_AMBULATORY_CARE_PROVIDER_SITE_OTHER): Payer: BC Managed Care – PPO | Admitting: Medical-Surgical

## 2021-04-02 ENCOUNTER — Encounter: Payer: Self-pay | Admitting: Medical-Surgical

## 2021-04-02 DIAGNOSIS — G43109 Migraine with aura, not intractable, without status migrainosus: Secondary | ICD-10-CM | POA: Diagnosis not present

## 2021-04-02 DIAGNOSIS — Z6836 Body mass index (BMI) 36.0-36.9, adult: Secondary | ICD-10-CM

## 2021-04-02 MED ORDER — PHENTERMINE HCL 37.5 MG PO TABS: ORAL_TABLET | ORAL | 0 refills | Status: DC

## 2021-04-02 NOTE — Progress Notes (Signed)
Subjective:    CC: weight check  HPI: Pleasant 40 year old female presenting today for weight check on phentermine.  She is taking phentermine 37.5 mg daily, tolerating well.  She has experienced dry mouth but otherwise no significant side effects.  No interference with sleep.  She has been walking and swimming for exercise.  She gave up sodas and is working to cut down sugary foods.  She also has cut out fast food.  She is drinking water to stay well-hydrated.  Migraines-taking Topamax 200 mg daily.  Notes that her headaches have resolved completely unless she makes bad dietary and alcohol choices.  I reviewed the past medical history, family history, social history, surgical history, and allergies today and no changes were needed.  Please see the problem list section below in epic for further details.  Past Medical History: Past Medical History:  Diagnosis Date   Anxiety    Depression    DVT (deep venous thrombosis) (HCC)    Hypertension    Migraine    Pap smear abnormality of cervix/human papillomavirus (HPV) positive    Seizures (HCC)    Stroke [facility])    Past Surgical History: Past Surgical History:  Procedure Laterality Date   CARPAL TUNNEL RELEASE     CESAREAN SECTION     INTERSTIM IMPLANT PLACEMENT     tenze     TUBAL LIGATION     Social History: Social History   Socioeconomic History   Marital status: Single    Spouse name: Not on file   Number of children: Not on file   Years of education: Not on file   Highest education level: Not on file  Occupational History   Not on file  Tobacco Use   Smoking status: Former    Pack years: 0.00   Smokeless tobacco: Never  Vaping Use   Vaping Use: Never used  Substance and Sexual Activity   Alcohol use: Yes    Alcohol/week: 3.0 standard drinks    Types: 3 Glasses of wine per week   Drug use: Never   Sexual activity: Yes    Partners: Male    Birth control/protection: Surgical  Other Topics Concern   Not on file   Social History Narrative   Not on file   Social Determinants of Health   Financial Resource Strain: Not on file  Food Insecurity: Not on file  Transportation Needs: Not on file  Physical Activity: Not on file  Stress: Not on file  Social Connections: Not on file   Family History: Family History  Problem Relation Age of Onset   Hypertension Mother    Hypertension Father    Clotting disorder Father    Diabetes Father    Deep vein thrombosis Father    Breast cancer Maternal Aunt    Factor V Leiden deficiency Maternal Aunt    Deep vein thrombosis Paternal Aunt    Stroke Maternal Grandmother    Prostate cancer Maternal Grandfather    Stroke Paternal Grandfather    Colon cancer Paternal Grandfather    Breast cancer Maternal Great-grandmother    Allergies: Allergies  Allergen Reactions   Chocolate Anaphylaxis   Cocoa Butter Anaphylaxis   Codeine Hives and Itching   Propoxyphene Itching   Sulfa Antibiotics     Yeast infection   Lisinopril Cough   Medications: See med rec.  Review of Systems: See HPI for pertinent positives and negatives.   Objective:    General: Well Developed, well nourished, and in no acute  distress.  Neuro: Alert and oriented x3.  HEENT: Normocephalic, atraumatic.  Skin: Warm and dry. Cardiac: Regular rate and rhythm, no murmurs rubs or gallops, no lower extremity edema.  Respiratory: Clear to auscultation bilaterally. Not using accessory muscles, speaking in full sentences.  Impression and Recommendations:    1. Class 2 severe obesity due to excess calories with serious comorbidity and body mass index (BMI) of 36.0 to 36.9 in adult [facility]) According to our scale, 6 pound weight loss since her last visit.  Continue phentermine 37.5 mg daily.  Continue regular exercise and dietary modifications. - phentermine (ADIPEX-P) 37.5 MG tablet; One tab by mouth qAM  Dispense: 30 tablet; Refill: 0  2. Migraine with aura and without status migrainosus, not  intractable Well-controlled.  Continue Topamax 200 mg daily.  Return in about 4 weeks (around 04/30/2021) for weight check. ___________________________________________ Thayer Ohm, DNP, APRN, FNP-BC Primary Care and Sports Medicine [facility] [place]

## 2021-04-30 ENCOUNTER — Encounter: Payer: BC Managed Care – PPO | Admitting: Medical-Surgical

## 2021-04-30 NOTE — Progress Notes (Signed)
Erroneous encounter

## 2021-05-06 ENCOUNTER — Other Ambulatory Visit: Payer: Self-pay

## 2021-05-06 ENCOUNTER — Ambulatory Visit (INDEPENDENT_AMBULATORY_CARE_PROVIDER_SITE_OTHER): Payer: BC Managed Care – PPO | Admitting: Medical-Surgical

## 2021-05-06 ENCOUNTER — Encounter: Payer: Self-pay | Admitting: Medical-Surgical

## 2021-05-06 VITALS — BP 115/76 | HR 98 | Temp 97.8°F | Ht 65.5 in | Wt 218.9 lb

## 2021-05-06 DIAGNOSIS — K219 Gastro-esophageal reflux disease without esophagitis: Secondary | ICD-10-CM

## 2021-05-06 DIAGNOSIS — Z6836 Body mass index (BMI) 36.0-36.9, adult: Secondary | ICD-10-CM | POA: Diagnosis not present

## 2021-05-06 MED ORDER — PHENTERMINE HCL 37.5 MG PO TABS: ORAL_TABLET | ORAL | 0 refills | Status: DC

## 2021-05-06 MED ORDER — FAMOTIDINE 20 MG PO TABS: 20.0000 mg | ORAL_TABLET | Freq: Two times a day (BID) | ORAL | 1 refills | Status: DC

## 2021-05-06 NOTE — Progress Notes (Signed)
Subjective:    CC: Weight check  HPI: Pleasant 40 year old female presenting today for weight check on phentermine and Topamax.  She is taking phentermine 37.5 mg daily and Topamax 200 mg daily.  She is tolerating both medications well without side effects.  She did not take a dose of phentermine on Friday or Saturday as she knew she would be at a wedding and drinking alcohol.  She has had 2 back-to-back weekend weddings to attend which has not done her any favors in the weight loss department.  She is unsure how low she got but notes that she had lost more than it appears to have been today.  She has given up sugar completely and is only allowing herself approximately 1 cup of coffee per week.  She is staying very busy doing walking, swimming, and using resistance bands for her lower extremities.  She is not counting calories or macros at this point but is trying to make sensible decisions in her diet.  Has had a worsening of her reflux symptoms.  Notes that she does enjoy spicy foods and eats them almost every day.  She is taking Protonix 40 mg daily as prescribed.  She has not tried any other over-the-counter remedies.  I reviewed the past medical history, family history, social history, surgical history, and allergies today and no changes were needed.  Please see the problem list section below in epic for further details.  Past Medical History: Past Medical History:  Diagnosis Date   Anxiety    Depression    DVT (deep venous thrombosis) (HCC)    Hypertension    Migraine    Pap smear abnormality of cervix/human papillomavirus (HPV) positive    Seizures (HCC)    Stroke [facility])    Past Surgical History: Past Surgical History:  Procedure Laterality Date   CARPAL TUNNEL RELEASE     CESAREAN SECTION     INTERSTIM IMPLANT PLACEMENT     tenze     TUBAL LIGATION     Social History: Social History   Socioeconomic History   Marital status: Single    Spouse name: Not on file   Number of  children: Not on file   Years of education: Not on file   Highest education level: Not on file  Occupational History   Not on file  Tobacco Use   Smoking status: Former   Smokeless tobacco: Never  Vaping Use   Vaping Use: Never used  Substance and Sexual Activity   Alcohol use: Yes    Alcohol/week: 3.0 standard drinks    Types: 3 Glasses of wine per week   Drug use: Never   Sexual activity: Yes    Partners: Male    Birth control/protection: Surgical  Other Topics Concern   Not on file  Social History Narrative   Not on file   Social Determinants of Health   Financial Resource Strain: Not on file  Food Insecurity: Not on file  Transportation Needs: Not on file  Physical Activity: Not on file  Stress: Not on file  Social Connections: Not on file   Family History: Family History  Problem Relation Age of Onset   Hypertension Mother    Hypertension Father    Clotting disorder Father    Diabetes Father    Deep vein thrombosis Father    Breast cancer Maternal Aunt    Factor V Leiden deficiency Maternal Aunt    Deep vein thrombosis Paternal Aunt    Stroke Maternal Grandmother  Prostate cancer Maternal Grandfather    Stroke Paternal Grandfather    Colon cancer Paternal Grandfather    Breast cancer Maternal Great-grandmother    Allergies: Allergies  Allergen Reactions   Chocolate Anaphylaxis   Cocoa Butter Anaphylaxis   Codeine Hives and Itching   Propoxyphene Itching   Sulfa Antibiotics     Yeast infection   Lisinopril Cough   Medications: See med rec.  Review of Systems: See HPI for pertinent positives and negatives.   Objective:    General: Well Developed, well nourished, and in no acute distress.  Neuro: Alert and oriented x3.  HEENT: Normocephalic, atraumatic.  Skin: Warm and dry. Cardiac: Regular rate and rhythm, no murmurs rubs or gallops, no lower extremity edema.  Respiratory: Clear to auscultation bilaterally. Not using accessory muscles,  speaking in full sentences.  Impression and Recommendations:    1. Class 2 severe obesity due to excess calories with serious comorbidity and body mass index (BMI) of 36.0 to 36.9 in adult [facility]) She has lost approximately 5 pounds over the last 4 weeks.  Continue phentermine 37.5 mg and Topamax 200 mg daily as prescribed.  Discussed adding in resistance and weight training to help change her body composition.  Recommend logging foods to evaluate macros so she can better balance her intake and protein requirements. - phentermine (ADIPEX-P) 37.5 MG tablet; One tab by mouth qAM  Dispense: 30 tablet; Refill: 0  2. Gastroesophageal reflux disease without esophagitis Continue Protonix 40 mg daily.  Adding famotidine 20 mg twice daily as needed.  Return in about 4 weeks (around 06/03/2021) for weight check.  ___________________________________________ Thayer Ohm, DNP, APRN, FNP-BC Primary Care and Sports Medicine [facility] [place]

## 2021-05-15 ENCOUNTER — Other Ambulatory Visit: Payer: Self-pay | Admitting: Medical-Surgical

## 2021-05-15 DIAGNOSIS — I10 Essential (primary) hypertension: Secondary | ICD-10-CM

## 2021-05-30 ENCOUNTER — Other Ambulatory Visit: Payer: Self-pay | Admitting: Medical-Surgical

## 2021-06-02 NOTE — Progress Notes (Signed)
  HPI with pertinent ROS:   CC: weight check, mood follow up  HPI: Pleasant 40 year old female presenting for weight check on phentermine and Topamax.  She has been taking phentermine 37.5 mg daily, tolerating well without side effects.  She is taking Topamax 200 mg daily, tolerating well without side effects as well.  She notes over the last month she has had very little focus due to increased stress at work at home.  She has worked on more portion control and is usually eating 1 meal per day with healthy snacks portions of throughout the day.  She is not logging her food although she does have my fitness pal on her phone.  She is doing some walking as well as yoga and light weight lifting most days.  Verbalizes that she understands that she is responsible for not doing as well as she could since weight loss is something that you get out what you put in.  Mood-taking Lexapro 20 mg daily and using clonazepam 0.5 mg as needed.  She is currently out but notes that at times, when her anxiety is really bad, she uses the clonazepam up to 2 times per day.  Admits that she would prefer to be off of that particular medication if there is something that could help manage her anxiety.  She does have a long history of trying different antidepressant medications and has not tolerated any of them the Lexapro.  She is understandably hesitant to switch from Lexapro to another agent due to the risk of intolerances, side effects, etc.  Denies SI/HI.  I reviewed the past medical history, family history, social history, surgical history, and allergies today and no changes were needed.  Please see the problem list section below in epic for further details.   Physical exam:   General: Well Developed, well nourished, and in no acute distress.  Neuro: Alert and oriented x3.  HEENT: Normocephalic, atraumatic.  Skin: Warm and dry. Cardiac: Regular rate and rhythm.  Respiratory: Not using accessory muscles, speaking in  full sentences.  Impression and Recommendations:    1. Encounter for weight management Has lost approximately 3.5 more pounds.  Discussed lifestyle modifications and the importance of adherence to goals.  Continue phentermine 37.5 mg 1 more month then we will evaluate progress.  Continue Topamax 200 mg daily as ordered. - phentermine (ADIPEX-P) 37.5 MG tablet; One tab by mouth qAM  Dispense: 30 tablet; Refill: 0  2. GAD (generalized anxiety disorder) Continue Lexapro 20 mg daily.  Continues very sparing use of clonazepam for severe anxiety only.  Starting BuSpar 5-15 mg twice daily.  Advised patient that this medication is best taken regularly but can be taken on a as needed basis if desired. - clonazePAM (KLONOPIN) 0.5 MG tablet; Take 1 tablet (0.5 mg total) by mouth 2 (two) times daily as needed.  Dispense: 30 tablet; Refill: 0 - busPIRone (BUSPAR) 5 MG tablet; Take 1-3 tablets (5-15 mg total) by mouth 2 (two) times daily.  Dispense: 120 tablet; Refill: 0  Return in about 4 weeks (around 07/01/2021) for weight check, mood follow up. ___________________________________________ Thayer Ohm, DNP, APRN, FNP-BC Primary Care and Sports Medicine [facility] [place]

## 2021-06-03 ENCOUNTER — Ambulatory Visit (INDEPENDENT_AMBULATORY_CARE_PROVIDER_SITE_OTHER): Payer: BC Managed Care – PPO | Admitting: Medical-Surgical

## 2021-06-03 ENCOUNTER — Other Ambulatory Visit: Payer: Self-pay

## 2021-06-03 ENCOUNTER — Encounter: Payer: Self-pay | Admitting: Medical-Surgical

## 2021-06-03 VITALS — BP 133/91 | HR 92 | Resp 20 | Wt 214.6 lb

## 2021-06-03 DIAGNOSIS — Z6834 Body mass index (BMI) 34.0-34.9, adult: Secondary | ICD-10-CM

## 2021-06-03 DIAGNOSIS — Z7689 Persons encountering health services in other specified circumstances: Secondary | ICD-10-CM | POA: Diagnosis not present

## 2021-06-03 DIAGNOSIS — F411 Generalized anxiety disorder: Secondary | ICD-10-CM | POA: Diagnosis not present

## 2021-06-03 MED ORDER — PHENTERMINE HCL 37.5 MG PO TABS: ORAL_TABLET | ORAL | 0 refills | Status: DC

## 2021-06-03 MED ORDER — CLONAZEPAM 0.5 MG PO TABS: 0.5000 mg | ORAL_TABLET | Freq: Two times a day (BID) | ORAL | 0 refills | Status: DC | PRN

## 2021-06-03 MED ORDER — BUSPIRONE HCL 5 MG PO TABS: 5.0000 mg | ORAL_TABLET | Freq: Two times a day (BID) | ORAL | 0 refills | Status: DC

## 2021-06-15 ENCOUNTER — Other Ambulatory Visit: Payer: Self-pay | Admitting: Medical-Surgical

## 2021-06-15 DIAGNOSIS — I10 Essential (primary) hypertension: Secondary | ICD-10-CM

## 2021-06-28 ENCOUNTER — Other Ambulatory Visit: Payer: Self-pay | Admitting: Medical-Surgical

## 2021-06-28 DIAGNOSIS — F411 Generalized anxiety disorder: Secondary | ICD-10-CM

## 2021-07-01 ENCOUNTER — Ambulatory Visit (INDEPENDENT_AMBULATORY_CARE_PROVIDER_SITE_OTHER): Payer: Self-pay | Admitting: Medical-Surgical

## 2021-07-01 DIAGNOSIS — Z5329 Procedure and treatment not carried out because of patient's decision for other reasons: Secondary | ICD-10-CM

## 2021-07-01 NOTE — Progress Notes (Signed)
   Complete physical exam  Patient: Linda [name]   DOB: 08/09/1998   40 y.o. Female  MRN: [id number]  Subjective:    No chief complaint on file.   Linda [name] is a 40 y.o. female who presents today for a complete physical exam. She reports consuming a {diet types:17450} diet. {types:19826} She generally feels {DESC; WELL/FAIRLY WELL/POORLY:18703}. She reports sleeping {DESC; WELL/FAIRLY WELL/POORLY:18703}. She {does/does not:200015} have additional problems to discuss today.    Most recent fall risk assessment:    04/16/2022   10:42 AM  Fall Risk   Falls in the past year? 0  Number falls in past yr: 0  Injury with Fall? 0  Risk for fall due to : No Fall Risks  Follow up Falls evaluation completed     Most recent depression screenings:    04/16/2022   10:42 AM 03/07/2021   10:46 AM  PHQ 2/9 Scores  PHQ - 2 Score 0 0  PHQ- 9 Score 5     {VISON DENTAL STD PSA (Optional):27386}  {History (Optional):23778}  Patient Care Team: Shamariah Shewmake, NP as PCP - General (Nurse Practitioner)   Outpatient Medications Prior to Visit  Medication Sig   fluticasone (FLONASE) 50 MCG/ACT nasal spray Place 2 sprays into both nostrils in the morning and at bedtime. After 7 days, reduce to once daily.   norgestimate-ethinyl estradiol (SPRINTEC 28) 0.25-35 MG-MCG tablet Take 1 tablet by mouth daily.   Nystatin POWD Apply liberally to affected area 2 times per day   spironolactone (ALDACTONE) 100 MG tablet Take 1 tablet (100 mg total) by mouth daily.   No facility-administered medications prior to visit.    ROS        Objective:     There were no vitals taken for this visit. {Vitals History (Optional):23777}  Physical Exam   No results found for any visits on 05/22/22. {Show previous labs (optional):23779}    Assessment & Plan:    Routine Health Maintenance and Physical Exam  Immunization History  Administered Date(s) Administered   DTaP 10/23/1999, 12/19/1999,  02/27/2000, 11/12/2000, 05/28/2004   Hepatitis A 03/24/2008, 03/30/2009   Hepatitis B 08/10/1999, 09/17/1999, 02/27/2000   HiB (PRP-OMP) 10/23/1999, 12/19/1999, 02/27/2000, 11/12/2000   IPV 10/23/1999, 12/19/1999, 08/17/2000, 05/28/2004   Influenza,inj,Quad PF,6+ Mos 06/30/2014   Influenza-Unspecified 09/29/2012   MMR 08/17/2001, 05/28/2004   Meningococcal Polysaccharide 03/29/2012   Pneumococcal Conjugate-13 11/12/2000   Pneumococcal-Unspecified 02/27/2000, 05/12/2000   Tdap 03/29/2012   Varicella 08/17/2000, 03/24/2008    Health Maintenance  Topic Date Due   HIV Screening  Never done   Hepatitis C Screening  Never done   INFLUENZA VACCINE  05/20/2022   PAP-Cervical Cytology Screening  05/22/2022 (Originally 08/08/2020)   PAP SMEAR-Modifier  05/22/2022 (Originally 08/08/2020)   TETANUS/TDAP  05/22/2022 (Originally 03/29/2022)   HPV VACCINES  Discontinued   COVID-19 Vaccine  Discontinued    Discussed health benefits of physical activity, and encouraged her to engage in regular exercise appropriate for her age and condition.  Problem List Items Addressed This Visit   None Visit Diagnoses     Annual physical exam    -  Primary   Cervical cancer screening       Need for Tdap vaccination          No follow-ups on file.     Naif Alabi, NP   

## 2021-07-03 ENCOUNTER — Telehealth: Payer: Self-pay | Admitting: *Deleted

## 2021-07-03 MED ORDER — SLYND 4 MG PO TABS: 1.0000 | ORAL_TABLET | Freq: Every day | ORAL | 6 refills | Status: DC

## 2021-07-03 NOTE — Telephone Encounter (Signed)
Pt sent message in that she has been having bleeding every month the week before her placebos.  Per Dr Macon Large we can try [facility] and see if that helps.  Pt notified and encouraged if she continues to bleed to make and appt for evaluation.

## 2021-07-08 ENCOUNTER — Ambulatory Visit (INDEPENDENT_AMBULATORY_CARE_PROVIDER_SITE_OTHER): Payer: Self-pay | Admitting: Medical-Surgical

## 2021-07-08 ENCOUNTER — Other Ambulatory Visit: Payer: Self-pay

## 2021-07-08 ENCOUNTER — Encounter: Payer: Self-pay | Admitting: Medical-Surgical

## 2021-07-08 VITALS — BP 121/86 | HR 102 | Resp 20 | Ht 65.5 in | Wt 212.7 lb

## 2021-07-08 DIAGNOSIS — I10 Essential (primary) hypertension: Secondary | ICD-10-CM

## 2021-07-08 DIAGNOSIS — F411 Generalized anxiety disorder: Secondary | ICD-10-CM

## 2021-07-08 DIAGNOSIS — Z7689 Persons encountering health services in other specified circumstances: Secondary | ICD-10-CM

## 2021-07-08 MED ORDER — PHENTERMINE HCL 37.5 MG PO TABS: ORAL_TABLET | ORAL | 0 refills | Status: DC

## 2021-07-08 MED ORDER — AMLODIPINE BESYLATE 5 MG PO TABS: 5.0000 mg | ORAL_TABLET | Freq: Every day | ORAL | 0 refills | Status: DC

## 2021-07-08 MED ORDER — HYDROCHLOROTHIAZIDE 12.5 MG PO TABS: 12.5000 mg | ORAL_TABLET | Freq: Every day | ORAL | 3 refills | Status: DC

## 2021-07-08 NOTE — Progress Notes (Signed)
HPI with pertinent ROS:   CC: Weight check  HPI: Pleasant 40 year old female presenting for the following  Weight check-taking phentermine 37.5 mg daily and Topamax 200 mg daily.  Tolerate both medications well without side effects.  She has not noticed much of a difference in her scale but has been exercising regularly and notices that her clothes are fitting much differently.  Notes that her appetite is somewhat decreased but not greatly.  Feels that the medication benefit is more related to energy rather than appetite management.  Mood-taking Lexapro 20 mg daily and BuSpar 5 mg once daily.  Tolerating both medications well without side effects.  Feels that this medication regimen is keeping her fairly stable.  Skin concern-notices that over the last 30 days, she has had an increase in a burning sensation over her skin when she is exposed to more than 15 minutes of sunlight.  She has been getting easily overheated when outside and is very concerned since 90% of her work is done outside in the heat.  No new medications outside of BuSpar or changes in dietary habits.  I reviewed the past medical history, family history, social history, surgical history, and allergies today and no changes were needed.  Please see the problem list section below in epic for further details.  Depression screen [facility] 2/9 07/08/2021 04/02/2021 01/03/2020 12/06/2019  Decreased Interest 0 0 0 2  Down, Depressed, Hopeless 1 1 1 2   PHQ - 2 Score 1 1 1 4   Altered sleeping 2 1 3 3   Tired, decreased energy 1 1 3 1   Change in appetite 0 1 0 3  Feeling bad or failure about yourself  0 1 0 2  Trouble concentrating 0 0 1 0  Moving slowly or fidgety/restless 0 0 0 0  Suicidal thoughts 0 0 0 0  PHQ-9 Score 4 5 8 13   Difficult doing work/chores Not difficult at all Not difficult at all Somewhat difficult Not difficult at all   GAD 7 : Generalized Anxiety Score 07/08/2021 04/02/2021 01/03/2020 12/06/2019  Nervous, Anxious, on Edge  1 1 0 3  Control/stop worrying 0 0 1 2  Worry too much - different things 0 0 1 1  Trouble relaxing 1 1 2 3   Restless 0 0 2 0  Easily annoyed or irritable 1 1 2 3   Afraid - awful might happen 0 0 0 0  Total GAD 7 Score 3 3 8 12   Anxiety Difficulty Not difficult at all Not difficult at all Somewhat difficult Somewhat difficult     Physical exam:   General: Well Developed, well nourished, and in no acute distress.  Neuro: Alert and oriented x3.  HEENT: Normocephalic, atraumatic.  Skin: Warm and dry. Cardiac: Regular rate and rhythm, no murmurs rubs or gallops, no lower extremity edema.  Respiratory: Clear to auscultation bilaterally. Not using accessory muscles, speaking in full sentences.  Impression and Recommendations:    1. Encounter for weight management 1.3 pound weight loss in the past 4 weeks.  She has noticed benefits and her clothes are fitting so we will continue phentermine 37.5 mg daily with Topamax 200 mg daily.  Strongly recommend continue increased exercise and working on dietary intake. - phentermine (ADIPEX-P) 37.5 MG tablet; One tab by mouth qAM  Dispense: 30 tablet; Refill: 0  2. GAD (generalized anxiety disorder) Doing much better on Lexapro 20 mg daily with BuSpar 5 mg once daily.  3. Essential hypertension Concerns with skin photosensitivity that has developed over  the last 30 days.  On evaluation, it appears that losartan as well as Lexapro can have this side effect.  She would like to stay on Lexapro so we are switching losartan to amlodipine 5 mg daily and continuing hydrochlorothiazide 12.5 mg daily.  Return in about 4 weeks (around 08/05/2021) for weight check. ___________________________________________ Thayer Ohm, DNP, APRN, FNP-BC Primary Care and Sports Medicine [facility] [place]

## 2021-07-11 ENCOUNTER — Other Ambulatory Visit: Payer: Self-pay | Admitting: Medical-Surgical

## 2021-07-13 ENCOUNTER — Encounter: Payer: Self-pay | Admitting: Medical-Surgical

## 2021-07-28 ENCOUNTER — Other Ambulatory Visit: Payer: Self-pay | Admitting: Medical-Surgical

## 2021-07-28 DIAGNOSIS — F411 Generalized anxiety disorder: Secondary | ICD-10-CM

## 2021-08-05 ENCOUNTER — Ambulatory Visit (INDEPENDENT_AMBULATORY_CARE_PROVIDER_SITE_OTHER): Payer: BC Managed Care – PPO | Admitting: Medical-Surgical

## 2021-08-05 ENCOUNTER — Encounter: Payer: Self-pay | Admitting: Medical-Surgical

## 2021-08-05 VITALS — BP 137/87 | HR 66 | Resp 20 | Ht 65.5 in | Wt 211.0 lb

## 2021-08-05 DIAGNOSIS — Z7689 Persons encountering health services in other specified circumstances: Secondary | ICD-10-CM

## 2021-08-05 DIAGNOSIS — M7989 Other specified soft tissue disorders: Secondary | ICD-10-CM

## 2021-08-05 DIAGNOSIS — J329 Chronic sinusitis, unspecified: Secondary | ICD-10-CM | POA: Diagnosis not present

## 2021-08-05 DIAGNOSIS — J40 Bronchitis, not specified as acute or chronic: Secondary | ICD-10-CM

## 2021-08-05 MED ORDER — ALBUTEROL SULFATE HFA 108 (90 BASE) MCG/ACT IN AERS: 2.0000 | INHALATION_SPRAY | Freq: Four times a day (QID) | RESPIRATORY_TRACT | 11 refills | Status: DC | PRN

## 2021-08-05 MED ORDER — AMOXICILLIN-POT CLAVULANATE 875-125 MG PO TABS: 1.0000 | ORAL_TABLET | Freq: Two times a day (BID) | ORAL | 0 refills | Status: DC

## 2021-08-05 MED ORDER — HYDROCODONE BIT-HOMATROP MBR 5-1.5 MG/5ML PO SOLN: 5.0000 mL | Freq: Three times a day (TID) | ORAL | 0 refills | Status: DC | PRN

## 2021-08-05 MED ORDER — BENZONATATE 200 MG PO CAPS: 200.0000 mg | ORAL_CAPSULE | Freq: Three times a day (TID) | ORAL | 0 refills | Status: DC | PRN

## 2021-08-05 MED ORDER — FLUCONAZOLE 150 MG PO TABS: 150.0000 mg | ORAL_TABLET | Freq: Once | ORAL | 0 refills | Status: AC

## 2021-08-05 NOTE — Progress Notes (Signed)
  HPI with pertinent ROS:   CC: Upper respiratory symptoms, left axillary swelling  HPI: Pleasant 40 year old female presenting today with complaints of greater than 1 week of upper respiratory symptoms including sore throat, sinus congestion, facial pressure/pain, stuffy ears, cough productive of thick brown sputum, fever, and some chest soreness.  Notes her daughter is in preschool and has been bringing home illnesses frequently.  She has tried multiple over-the-counter remedies without relief.  COVID tested with negative results.  Has had several months of intermittent swelling to the left axillary region.  Notes it will get better and then worse but lately seems to have continued worsening.  Occasionally gets small bumps in the area that pop but has had some tenderness and discomfort in the area more recently.  Has not taken phentermine in approximately 2 weeks due to her current illness and poor appetite.  Continues to take Topamax as prescribed.  Activity level limited by illness.  I reviewed the past medical history, family history, social history, surgical history, and allergies today and no changes were needed.  Please see the problem list section below in epic for further details.   Physical exam:   General: Well Developed, well nourished, and in no acute distress.  Neuro: Alert and oriented x3.  HEENT: Normocephalic, atraumatic, pupils equal round reactive to light, neck supple, no masses, no lymphadenopathy, thyroid nonpalpable.  External ear canals and TMs normal bilaterally.  Bilateral maxillary and frontal sinus tenderness. Skin: Warm and dry. Cardiac: Regular rate and rhythm, no murmurs rubs or gallops, no lower extremity edema.  Respiratory: Clear to auscultation bilaterally. Not using accessory muscles, speaking in full sentences.  Impression and Recommendations:    1. Left axillary swelling Unclear etiology.  We will get an ultrasound of the left axilla for further  investigation.  No significant findings on exam outside of soft tissue swelling and no palpable lymph nodes.  No clinical signs of abscess. - [place] AXILLA LEFT; Future  2. Sinobronchitis Augmentin twice daily x7 days.  Sending in Diflucan for vulvovaginal candidiasis prophylaxis.  Hycodan cough syrup and Tessalon Perles for cough.  Sending in albuterol to help with shortness of breath and wheezing.  3.  Encounter for weight management Not a clear picture of effectiveness of current weight loss measures due to missing 2 weeks of phentermine dosing.  Resume phentermine 37.5 mg daily.  Continue Topamax.  We will reevaluate progress in another 4 weeks.  As of last appointment, approximately 1.8 pounds lost despite not taking the phentermine for 2 weeks.  Return in about 4 weeks (around 09/02/2021) for weight check. ___________________________________________ Thayer Ohm, DNP, APRN, FNP-BC Primary Care and Sports Medicine [facility] [place]

## 2021-08-06 ENCOUNTER — Telehealth: Payer: Self-pay

## 2021-08-07 NOTE — Telephone Encounter (Signed)
Pt called and asked about her cough medication, informed pt PCP sent over her cough medicine to [facility].

## 2021-08-13 ENCOUNTER — Other Ambulatory Visit: Payer: Self-pay | Admitting: Medical-Surgical

## 2021-08-13 DIAGNOSIS — M7989 Other specified soft tissue disorders: Secondary | ICD-10-CM

## 2021-08-22 ENCOUNTER — Other Ambulatory Visit: Payer: Self-pay | Admitting: Medical-Surgical

## 2021-08-22 DIAGNOSIS — Z6836 Body mass index (BMI) 36.0-36.9, adult: Secondary | ICD-10-CM

## 2021-08-22 DIAGNOSIS — G43109 Migraine with aura, not intractable, without status migrainosus: Secondary | ICD-10-CM

## 2021-08-27 ENCOUNTER — Other Ambulatory Visit: Payer: Self-pay | Admitting: Medical-Surgical

## 2021-08-27 DIAGNOSIS — F411 Generalized anxiety disorder: Secondary | ICD-10-CM

## 2021-09-08 ENCOUNTER — Other Ambulatory Visit: Payer: Self-pay | Admitting: Medical-Surgical

## 2021-09-08 DIAGNOSIS — K219 Gastro-esophageal reflux disease without esophagitis: Secondary | ICD-10-CM

## 2021-09-10 ENCOUNTER — Ambulatory Visit (INDEPENDENT_AMBULATORY_CARE_PROVIDER_SITE_OTHER): Payer: BC Managed Care – PPO | Admitting: Medical-Surgical

## 2021-09-10 ENCOUNTER — Ambulatory Visit (INDEPENDENT_AMBULATORY_CARE_PROVIDER_SITE_OTHER): Payer: BC Managed Care – PPO

## 2021-09-10 ENCOUNTER — Other Ambulatory Visit: Payer: Self-pay

## 2021-09-10 ENCOUNTER — Encounter: Payer: Self-pay | Admitting: Medical-Surgical

## 2021-09-10 VITALS — BP 152/110 | HR 86 | Resp 20 | Ht 65.5 in | Wt 218.6 lb

## 2021-09-10 DIAGNOSIS — R051 Acute cough: Secondary | ICD-10-CM | POA: Diagnosis not present

## 2021-09-10 DIAGNOSIS — R059 Cough, unspecified: Secondary | ICD-10-CM | POA: Diagnosis not present

## 2021-09-10 DIAGNOSIS — J40 Bronchitis, not specified as acute or chronic: Secondary | ICD-10-CM

## 2021-09-10 DIAGNOSIS — Z7689 Persons encountering health services in other specified circumstances: Secondary | ICD-10-CM | POA: Diagnosis not present

## 2021-09-10 DIAGNOSIS — F411 Generalized anxiety disorder: Secondary | ICD-10-CM

## 2021-09-10 DIAGNOSIS — J329 Chronic sinusitis, unspecified: Secondary | ICD-10-CM | POA: Diagnosis not present

## 2021-09-10 MED ORDER — HYDROXYZINE PAMOATE 25 MG PO CAPS
25.0000 mg | ORAL_CAPSULE | Freq: Three times a day (TID) | ORAL | 0 refills | Status: DC | PRN
Start: 2021-09-10 — End: 2021-12-12

## 2021-09-10 MED ORDER — PREDNISONE 50 MG PO TABS: 50.0000 mg | ORAL_TABLET | Freq: Every day | ORAL | 0 refills | Status: DC

## 2021-09-10 MED ORDER — HYDROCOD POLST-CPM POLST ER 10-8 MG/5ML PO SUER: 5.0000 mL | Freq: Two times a day (BID) | ORAL | 0 refills | Status: DC | PRN

## 2021-09-10 MED ORDER — FLUCONAZOLE 150 MG PO TABS: 150.0000 mg | ORAL_TABLET | Freq: Once | ORAL | 0 refills | Status: AC

## 2021-09-10 MED ORDER — AZITHROMYCIN 250 MG PO TABS
ORAL_TABLET | ORAL | 0 refills | Status: AC
Start: 2021-09-10 — End: 2021-09-15

## 2021-09-10 NOTE — Progress Notes (Signed)
HPI with pertinent ROS:   CC: Weight check  HPI: Pleasant 40 year old female presenting today for weight check.  Unfortunately, she has been sick for the last couple of months without an improvement in her symptoms despite treatment.  Since she started getting sick, she has not been taking phentermine, exercising, or following dietary recommendations.  Notes that after her last visit, she was able to complete Augmentin and use Hycodan cough syrup as needed and her symptoms did gradually improve however her daughter caught the flu shortly afte.  Several days later, her symptoms began to worsen as she got sick again.  She is still having a severe barking cough along with shortness of breath and sinus congestion.  She is having difficulty sleeping at night.  So far none of her medications are helping outside of albuterol that she is using twice a day.  Because she has been so sick, her anxiety levels have been extremely high.  Notes that if she was not sick, she could probably handle what ever is coming at her but with everything in combination, she is struggling.  Uncertain if she has been taking both the amlodipine and hydrochlorothiazide as prescribed since there was some confusion on her medication list.  She is going to go home and make sure that she is taking the correct medicines.  I reviewed the past medical history, family history, social history, surgical history, and allergies today and no changes were needed.  Please see the problem list section below in epic for further details.   Physical exam:   General: Well Developed, well nourished, and in no acute distress.  Neuro: Alert and oriented x3.  HEENT: Normocephalic, atraumatic, pupils equal round reactive to light, neck supple, no masses, no lymphadenopathy, thyroid nonpalpable.  Bilateral external ear canals patent without erythema, bilateral TMs dull, cloudy without erythema. Skin: Warm and dry. Cardiac: Regular rate and rhythm, no  murmurs rubs or gallops, no lower extremity edema.  Respiratory: Clear to auscultation bilaterally. Not using accessory muscles, speaking in full sentences.  Impression and Recommendations:    1. Encounter for weight management Due to worsening health issues and poor resolution of her upper respiratory symptoms, we are going to postpone worries about weight management until she starts to feel better and is able to tolerate exercise and dietary changes.  2. Sinobronchitis Treating with azithromycin 500 mg today followed by 250 mg daily on days 2 through 5.  Prednisone 50 mg daily x5 days.  Sending in Diflucan for vulvovaginal candidiasis prophylaxis.  Tussionex every 12 hours as needed for cough. - azithromycin (ZITHROMAX) 250 MG tablet; Take 2 tablets on day 1, then 1 tablet daily on days 2 through 5  Dispense: 6 tablet; Refill: 0 - predniSONE (DELTASONE) 50 MG tablet; Take 1 tablet (50 mg total) by mouth daily.  Dispense: 5 tablet; Refill: 0 - fluconazole (DIFLUCAN) 150 MG tablet; Take 1 tablet (150 mg total) by mouth once for 1 dose.  Dispense: 1 tablet; Refill: 0 - chlorpheniramine-HYDROcodone (TUSSIONEX) 10-8 MG/5ML SUER; Take 5 mLs by mouth every 12 (twelve) hours as needed for cough (cough, will cause drowsiness.).  Dispense: 120 mL; Refill: 0  3. Acute cough Chest x-ray today.  Sending in Tussionex. - DG Chest 2 View; Future - chlorpheniramine-HYDROcodone (TUSSIONEX) 10-8 MG/5ML SUER; Take 5 mLs by mouth every 12 (twelve) hours as needed for cough (cough, will cause drowsiness.).  Dispense: 120 mL; Refill: 0  4. GAD (generalized anxiety disorder) Discontinue BuSpar.  Continue Lexapro.  Trial hydroxyzine 25 mg every 8 hours as needed for anxiety and insomnia. - hydrOXYzine (VISTARIL) 25 MG capsule; Take 1 capsule (25 mg total) by mouth every 8 (eight) hours as needed.  Dispense: 30 capsule; Refill: 0  Return in about 4 weeks (around 10/08/2021) for mood follow  up. ___________________________________________ Thayer Ohm, DNP, APRN, FNP-BC Primary Care and Sports Medicine [facility] [place]

## 2021-09-11 ENCOUNTER — Encounter: Payer: Self-pay | Admitting: Medical-Surgical

## 2021-09-11 DIAGNOSIS — R208 Other disturbances of skin sensation: Secondary | ICD-10-CM

## 2021-09-11 DIAGNOSIS — L299 Pruritus, unspecified: Secondary | ICD-10-CM

## 2021-09-30 ENCOUNTER — Ambulatory Visit (INDEPENDENT_AMBULATORY_CARE_PROVIDER_SITE_OTHER): Payer: Self-pay | Admitting: Medical-Surgical

## 2021-09-30 DIAGNOSIS — Z91199 Patient's noncompliance with other medical treatment and regimen due to unspecified reason: Secondary | ICD-10-CM

## 2021-09-30 NOTE — Progress Notes (Signed)
   Complete physical exam  Patient: Linda [name]   DOB: 08/09/1998   40 y.o. Female  MRN: [id number]  Subjective:    No chief complaint on file.   Linda [name] is a 40 y.o. female who presents today for a complete physical exam. She reports consuming a {diet types:17450} diet. {types:19826} She generally feels {DESC; WELL/FAIRLY WELL/POORLY:18703}. She reports sleeping {DESC; WELL/FAIRLY WELL/POORLY:18703}. She {does/does not:200015} have additional problems to discuss today.    Most recent fall risk assessment:    04/16/2022   10:42 AM  Fall Risk   Falls in the past year? 0  Number falls in past yr: 0  Injury with Fall? 0  Risk for fall due to : No Fall Risks  Follow up Falls evaluation completed     Most recent depression screenings:    04/16/2022   10:42 AM 03/07/2021   10:46 AM  PHQ 2/9 Scores  PHQ - 2 Score 0 0  PHQ- 9 Score 5     {VISON DENTAL STD PSA (Optional):27386}  {History (Optional):23778}  Patient Care Team: Brentton Wardlow, NP as PCP - General (Nurse Practitioner)   Outpatient Medications Prior to Visit  Medication Sig   fluticasone (FLONASE) 50 MCG/ACT nasal spray Place 2 sprays into both nostrils in the morning and at bedtime. After 7 days, reduce to once daily.   norgestimate-ethinyl estradiol (SPRINTEC 28) 0.25-35 MG-MCG tablet Take 1 tablet by mouth daily.   Nystatin POWD Apply liberally to affected area 2 times per day   spironolactone (ALDACTONE) 100 MG tablet Take 1 tablet (100 mg total) by mouth daily.   No facility-administered medications prior to visit.    ROS        Objective:     There were no vitals taken for this visit. {Vitals History (Optional):23777}  Physical Exam   No results found for any visits on 05/22/22. {Show previous labs (optional):23779}    Assessment & Plan:    Routine Health Maintenance and Physical Exam  Immunization History  Administered Date(s) Administered   DTaP 10/23/1999, 12/19/1999,  02/27/2000, 11/12/2000, 05/28/2004   Hepatitis A 03/24/2008, 03/30/2009   Hepatitis B 08/10/1999, 09/17/1999, 02/27/2000   HiB (PRP-OMP) 10/23/1999, 12/19/1999, 02/27/2000, 11/12/2000   IPV 10/23/1999, 12/19/1999, 08/17/2000, 05/28/2004   Influenza,inj,Quad PF,6+ Mos 06/30/2014   Influenza-Unspecified 09/29/2012   MMR 08/17/2001, 05/28/2004   Meningococcal Polysaccharide 03/29/2012   Pneumococcal Conjugate-13 11/12/2000   Pneumococcal-Unspecified 02/27/2000, 05/12/2000   Tdap 03/29/2012   Varicella 08/17/2000, 03/24/2008    Health Maintenance  Topic Date Due   HIV Screening  Never done   Hepatitis C Screening  Never done   INFLUENZA VACCINE  05/20/2022   PAP-Cervical Cytology Screening  05/22/2022 (Originally 08/08/2020)   PAP SMEAR-Modifier  05/22/2022 (Originally 08/08/2020)   TETANUS/TDAP  05/22/2022 (Originally 03/29/2022)   HPV VACCINES  Discontinued   COVID-19 Vaccine  Discontinued    Discussed health benefits of physical activity, and encouraged her to engage in regular exercise appropriate for her age and condition.  Problem List Items Addressed This Visit   None Visit Diagnoses     Annual physical exam    -  Primary   Cervical cancer screening       Need for Tdap vaccination          No follow-ups on file.     Micha Erck, NP   

## 2021-10-18 ENCOUNTER — Other Ambulatory Visit: Payer: Self-pay | Admitting: Medical-Surgical

## 2021-10-18 DIAGNOSIS — R52 Pain, unspecified: Secondary | ICD-10-CM

## 2021-12-12 ENCOUNTER — Encounter: Payer: Self-pay | Admitting: Medical-Surgical

## 2021-12-12 ENCOUNTER — Ambulatory Visit (INDEPENDENT_AMBULATORY_CARE_PROVIDER_SITE_OTHER): Payer: BC Managed Care – PPO | Admitting: Medical-Surgical

## 2021-12-12 ENCOUNTER — Other Ambulatory Visit: Payer: Self-pay

## 2021-12-12 VITALS — BP 147/104 | HR 96 | Resp 20 | Ht 65.5 in | Wt 218.1 lb

## 2021-12-12 DIAGNOSIS — F411 Generalized anxiety disorder: Secondary | ICD-10-CM | POA: Diagnosis not present

## 2021-12-12 DIAGNOSIS — I10 Essential (primary) hypertension: Secondary | ICD-10-CM | POA: Diagnosis not present

## 2021-12-12 MED ORDER — CHLORTHALIDONE 25 MG PO TABS: 25.0000 mg | ORAL_TABLET | Freq: Every day | ORAL | 1 refills | Status: DC

## 2021-12-12 MED ORDER — VENLAFAXINE HCL ER 37.5 MG PO CP24: 37.5000 mg | ORAL_CAPSULE | Freq: Every day | ORAL | 1 refills | Status: DC

## 2021-12-12 NOTE — Progress Notes (Signed)
°  HPI with pertinent ROS:   CC: Meds/weight check  HPI: Pleasant 41 year old female presenting today for medication follow-up.  Since she was last seen, she has made a decision to take herself off of all of her medications outside of Topamax and meloxicam.  She continue the Topamax for migraines as they never went away and she is using the Mobic for arthralgias/myalgias.  She cycled down off of her mood medication slowly so she did not go through withdrawals.  Unfortunately over the last couple of months, she has experienced a resurgence in her overall anxiety and notes that she is having difficulty with anger.  She is very hesitant to be on the medication and would like to consider something different as well as the lowest dose possible.  She did tolerate Lexapro well however it made her emotionless rather than just managing the anxiety.  Denies SI/HI.  Hypertension-she took herself off of the blood pressure medications as she thought these were the cause of her skin concerns.  Unfortunately, the skin concerns did not resolve with stopping the medications and has not improved since then.  She did not get to dermatology as referred due to multiple family issues.  She has changed her diet since the beginning of year and has avoided all red meat, pork, and chicken since January 1.  She now considers herself a pescatarian and is eating a low-sodium diet.  She is cut back on alcohol intake and reports drinking approximately one quarter of what she was at this time last year.  Has been monitoring her blood pressure periodically and notes that it has been creeping up over the last few weeks.  Reports this could be related to stress but feels that she may need the support of a low-dose of medication to prevent worsening of her hypertension and potential heart disease.  Positive for upper and lower extremity swelling in the evenings.  I reviewed the past medical history, family history, social history, surgical  history, and allergies today and no changes were needed.  Please see the problem list section below in epic for further details.   Physical exam:   General: Well Developed, well nourished, and in no acute distress.  Neuro: Alert and oriented x3.  HEENT: Normocephalic, atraumatic.  Skin: Warm and dry. Cardiac: Regular rate and rhythm, no murmurs rubs or gallops, no lower extremity edema.  Respiratory: Clear to auscultation bilaterally. Not using accessory muscles, speaking in full sentences.  Impression and Recommendations:    1. Essential hypertension Blood pressure not controlled today.  She is doing well with dietary modification.  Recommend continued intentional exercise at least 3 times weekly for 30 minutes each day.  Starting chlorthalidone 25 mg daily.  Recommend starting half tab for the first week then increasing to the full tablet.  Monitor blood pressure at home with goal of 130/80 or less.  2. GAD (generalized anxiety disorder) Reviewed various options.  Discussed possible counseling but will hold off on that today.  Starting venlafaxine 37.5 mg daily since she also has issues with night sweats, hot flashes, and mood swings.  Return in about 4 weeks (around 01/09/2022) for HTN/mood follow up. ___________________________________________ Thayer Ohm, DNP, APRN, FNP-BC Primary Care and Sports Medicine [facility] [place]

## 2021-12-16 ENCOUNTER — Other Ambulatory Visit: Payer: Self-pay | Admitting: Medical-Surgical

## 2021-12-16 DIAGNOSIS — Z7689 Persons encountering health services in other specified circumstances: Secondary | ICD-10-CM

## 2021-12-18 NOTE — Telephone Encounter (Signed)
Patient aware the prescription was denied and stated that it never should have been sent for refill.  ?

## 2022-01-11 ENCOUNTER — Encounter: Payer: Self-pay | Admitting: Medical-Surgical

## 2022-01-12 ENCOUNTER — Emergency Department (INDEPENDENT_AMBULATORY_CARE_PROVIDER_SITE_OTHER)
Admission: RE | Admit: 2022-01-12 | Discharge: 2022-01-12 | Disposition: A | Discharge status: Home / Self Care | Payer: BC Managed Care – PPO | Source: Ambulatory Visit

## 2022-01-12 ENCOUNTER — Other Ambulatory Visit: Payer: Self-pay

## 2022-01-12 VITALS — BP 133/92 | HR 109 | Temp 97.9°F | Resp 20 | Ht 65.0 in | Wt 215.0 lb

## 2022-01-12 DIAGNOSIS — K6289 Other specified diseases of anus and rectum: Secondary | ICD-10-CM

## 2022-01-12 DIAGNOSIS — K625 Hemorrhage of anus and rectum: Secondary | ICD-10-CM | POA: Diagnosis not present

## 2022-01-12 DIAGNOSIS — K648 Other hemorrhoids: Secondary | ICD-10-CM | POA: Diagnosis not present

## 2022-01-12 HISTORY — DX: Irritable bowel syndrome, unspecified: K58.9

## 2022-01-12 HISTORY — DX: Interstitial cystitis (chronic) without hematuria: N30.10

## 2022-01-12 MED ORDER — HYDROCORT-PRAMOXINE (PERIANAL) 1-1 % EX FOAM: 1.0000 | Freq: Three times a day (TID) | CUTANEOUS | 0 refills | Status: DC

## 2022-01-12 NOTE — ED Provider Notes (Signed)
?[facility] ? ? ? ?CSN: [id number] ?Arrival date & time: 01/12/22  4098 ? ? ?  ? ?History   ?Chief Complaint ?Chief Complaint  ?Patient presents with  ? Rectal Bleeding  ? Rectal Pain  ? 10:00 APPT  ? ? ?HPI ?Linda [name] is a 41 y.o. female.  ? ?HPI 41 year old presents with rectal bleeding/rectal pain x1 month.  PMH significant for obesity, IBS, stroke, DVT, and HTN.  ? ?Past Medical History:  ?Diagnosis Date  ? Anxiety   ? Depression   ? DVT (deep venous thrombosis) (HCC)   ? Hypertension   ? IBS (irritable bowel syndrome)   ? Interstitial cystitis   ? Migraine   ? Pap smear abnormality of cervix/human papillomavirus (HPV) positive   ? Seizures (HCC)   ? Stroke [facility])   ? ? ?Patient Active Problem List  ? Diagnosis Date Noted  ? Fracture of calcaneus, left, closed 12/19/2019  ? Migraine with aura and without status migrainosus, not intractable 12/06/2019  ? History of stroke 12/06/2019  ? History of DVT of lower extremity 12/06/2019  ? Chest pain 12/06/2019  ? Essential hypertension 12/06/2019  ? GAD (generalized anxiety disorder) 12/06/2019  ? BMI 34.0-34.9,adult 12/06/2019  ? ? ?Past Surgical History:  ?Procedure Laterality Date  ? CARPAL TUNNEL RELEASE    ? CESAREAN SECTION    ? INTERSTIM IMPLANT PLACEMENT    ? tenze    ? TUBAL LIGATION    ? ? ?OB History   ? ? Gravida  ?4  ? Para  ?2  ? Term  ?   ? Preterm  ?   ? AB  ?2  ? Living  ?2  ?  ? ? SAB  ?2  ? IAB  ?   ? Ectopic  ?   ? Multiple  ?   ? Live Births  ?   ?   ?  ?  ? ? ? ?Home Medications   ? ?Prior to Admission medications   ?Medication Sig Start Date End Date Taking? Authorizing Provider  ?hydrocortisone-pramoxine (PROCTOFOAM-HC) rectal foam Place 1 applicator rectally 3 (three) times daily. 01/12/22  Yes Trevor Iha, FNP  ?pantoprazole (PROTONIX) 20 MG tablet Take 20 mg by mouth daily.   Yes [provider]  ?chlorthalidone (HYGROTON) 25 MG tablet Take 1 tablet (25 mg total) by mouth daily. 12/12/21   Christen Butter, NP  ?meloxicam  (MOBIC) 15 MG tablet ONE TABLET EVERY MORNING WITH A MEAL FOR 2 WEEKS, THEN DAILY AS NEEDED FOR PAIN. 10/18/21   Christen Butter, NP  ?SLYND 4 MG TABS Take 1 tablet by mouth daily. 01/07/22   [provider]  ?topiramate (TOPAMAX) 200 MG tablet TAKE 1 TABLET BY MOUTH EVERY DAY 08/22/21   Christen Butter, NP  ?venlafaxine XR (EFFEXOR XR) 37.5 MG 24 hr capsule Take 1 capsule (37.5 mg total) by mouth daily with breakfast. 12/12/21   Christen Butter, NP  ? ? ?Family History ?Family History  ?Problem Relation Age of Onset  ? Hypertension Mother   ? Kidney failure Mother   ? Hypertension Father   ? Clotting disorder Father   ? Diabetes Father   ? Deep vein thrombosis Father   ? Stroke Maternal Grandmother   ? Prostate cancer Maternal Grandfather   ? Stroke Paternal Grandfather   ? Colon cancer Paternal Grandfather   ? Breast cancer Maternal Aunt   ? Factor V Leiden deficiency Maternal Aunt   ? Deep vein thrombosis Paternal Aunt   ?  Breast cancer Maternal Great-grandmother   ? ? ?Social History ?Social History  ? ?Tobacco Use  ? Smoking status: Former  ? Smokeless tobacco: Never  ?Vaping Use  ? Vaping Use: Never used  ?Substance Use Topics  ? Alcohol use: Yes  ?  Alcohol/week: 3.0 standard drinks  ?  Types: 3 Glasses of wine per week  ?  Comment: weekly  ? Drug use: Never  ? ? ? ?Allergies   ?Chocolate, Cocoa butter, Propoxyphene, Sulfa antibiotics, Lisinopril, and Codeine ? ? ?Review of Systems ?Review of Systems  ?Gastrointestinal:  Positive for anal bleeding and rectal pain.  ?All other systems reviewed and are negative. ? ? ?Physical Exam ?Triage Vital Signs ?ED Triage Vitals  ?Enc Vitals Group  ?   BP 01/12/22 1101 (!) 146/102  ?   Pulse Rate 01/12/22 1101 (!) 109  ?   Resp 01/12/22 1101 20  ?   Temp 01/12/22 1101 97.9 ?F (36.6 ?C)  ?   Temp Source 01/12/22 1101 Oral  ?   SpO2 01/12/22 1101 100 %  ?   Weight 01/12/22 1053 215 lb (97.5 kg)  ?   Height 01/12/22 1053 5\' 5"  (1.651 m)  ?   Head Circumference --   ?   Peak Flow  --   ?   Pain Score 01/12/22 1052 8  ?   Pain Loc --   ?   Pain Edu? --   ?   Excl. in GC? --   ? ?No data found. ? ?Updated Vital Signs ?BP (!) 133/92 (BP Location: Left Arm)   Pulse (!) 109   Temp 97.9 ?F (36.6 ?C) (Oral)   Resp 20   Ht 5\' 5"  (1.651 m)   Wt 215 lb (97.5 kg)   LMP  (LMP Unknown) Comment: takes contraceptive continuously  SpO2 100%   BMI 35.78 kg/m?  ? ? ?Physical Exam ?Vitals and nursing note reviewed.  ?Constitutional:   ?   General: She is not in acute distress. ?   Appearance: Normal appearance. She is obese. She is not ill-appearing.  ?HENT:  ?   Head: Normocephalic and atraumatic.  ?   Right Ear: Tympanic membrane, ear canal and external ear normal.  ?   Left Ear: Tympanic membrane, ear canal and external ear normal.  ?   Mouth/Throat:  ?   Mouth: Mucous membranes are moist.  ?   Pharynx: Oropharynx is clear.  ?Eyes:  ?   Extraocular Movements: Extraocular movements intact.  ?   Conjunctiva/sclera: Conjunctivae normal.  ?   Pupils: Pupils are equal, round, and reactive to light.  ?Cardiovascular:  ?   Rate and Rhythm: Normal rate and regular rhythm.  ?   Pulses: Normal pulses.  ?   Heart sounds: Normal heart sounds.  ?Pulmonary:  ?   Effort: Pulmonary effort is normal.  ?   Breath sounds: Normal breath sounds. No wheezing, rhonchi or rales.  ?Musculoskeletal:     ?   General: Normal range of motion.  ?   Cervical back: Normal range of motion and neck supple.  ?Skin: ?   General: Skin is warm and dry.  ?Neurological:  ?   General: No focal deficit present.  ?   Mental Status: She is alert and oriented to person, place, and time.  ? ? ? ?UC Treatments / Results  ?Labs ?(all labs ordered are listed, but only abnormal results are displayed) ?Labs Reviewed  ?CBC WITH DIFFERENTIAL/PLATELET  ?COMPLETE METABOLIC PANEL  WITH GFR  ?LIPASE  ? ? ?EKG ? ? ?Radiology ?No results found. ? ?Procedures ?Procedures (including critical care time) ? ?Medications Ordered in UC ?Medications - No data to  display ? ?Initial Impression / Assessment and Plan / UC Course  ?I have reviewed the triage vital signs and the nursing notes. ? ?Pertinent labs & imaging results that were available during my care of the patient were reviewed by me and considered in my medical decision making (see chart for details). ? ?  ? ?MDM: 1.  Rectal bleeding-patient's pictures cannot exclude possible internal hemorrhoids as blood is noted when wiping with toilet paper, CBC with differential, CMP, lipase ordered; 2.  Advised patient to follow-up with PCP for GI referral secondary to rectal pain for 1 month; 3.  Internal hemorrhoid-as above Rx'd Proctofoam HC. Advised patient to use medication as directed.  Advised patient we will follow-up with lab results once received.  Instructed/encouraged patient to follow-up with PCP first thing tomorrow morning, Monday, 01/13/2022 for GI referral for rectal pain for 1 month.  Advised patient will treat now empirically for internal hemorrhoid(s).  Advised patient if symptoms worsen and/or unresolved to please follow-up with PCP or here for further evaluation.  Patient discharged home, hemodynamically stable. ?Final Clinical Impressions(s) / UC Diagnoses  ? ?Final diagnoses:  ?Rectal bleeding  ?Rectal pain  ?Internal hemorrhoid  ? ? ? ?Discharge Instructions   ? ?  ?Advised patient to use medication as directed.  Advised patient we will follow-up with lab results once received.  Instructed/encouraged patient to follow-up with PCP first thing tomorrow morning, Monday, 01/13/2022 for GI referral for rectal pain for 1 month.  Advised patient will treat now empirically for internal hemorrhoid(s).  Advised patient if symptoms worsen and/or unresolved to please follow-up with PCP or here for further evaluation. ? ? ? ? ?ED Prescriptions   ? ? Medication Sig Dispense Auth. Provider  ? hydrocortisone-pramoxine (PROCTOFOAM-HC) rectal foam Place 1 applicator rectally 3 (three) times daily. 10 g Trevor Iha, FNP   ? ?  ? ?PDMP not reviewed this encounter. ?  ?Trevor Iha, FNP ?01/12/22 1201 ? ?

## 2022-01-12 NOTE — Discharge Instructions (Addendum)
Advised patient to use medication as directed.  Advised patient we will follow-up with lab results once received.  Instructed/encouraged patient to follow-up with PCP first thing tomorrow morning, Monday, 01/13/2022 for GI referral for rectal pain for 1 month.  Advised patient will treat now empirically for internal hemorrhoid(s).  Advised patient if symptoms worsen and/or unresolved to please follow-up with PCP or here for further evaluation. ?

## 2022-01-12 NOTE — ED Triage Notes (Signed)
Pt presents to Urgent Care with c/o rectal pain and bleeding x approx 1 month. States she has been unable to see what the area looks like, but she doesn't feel like anything is protruding externally.  ?

## 2022-01-13 ENCOUNTER — Ambulatory Visit (INDEPENDENT_AMBULATORY_CARE_PROVIDER_SITE_OTHER): Payer: BC Managed Care – PPO | Admitting: Medical-Surgical

## 2022-01-13 ENCOUNTER — Encounter: Payer: Self-pay | Admitting: Medical-Surgical

## 2022-01-13 ENCOUNTER — Ambulatory Visit: Payer: BC Managed Care – PPO | Admitting: Medical-Surgical

## 2022-01-13 VITALS — BP 134/91 | HR 98 | Resp 20 | Ht 65.0 in | Wt 215.8 lb

## 2022-01-13 DIAGNOSIS — K625 Hemorrhage of anus and rectum: Secondary | ICD-10-CM | POA: Diagnosis not present

## 2022-01-13 DIAGNOSIS — N3010 Interstitial cystitis (chronic) without hematuria: Secondary | ICD-10-CM | POA: Insufficient documentation

## 2022-01-13 DIAGNOSIS — B029 Zoster without complications: Secondary | ICD-10-CM | POA: Insufficient documentation

## 2022-01-13 LAB — COMPLETE METABOLIC PANEL WITH GFR
AG Ratio: 1.4 (calc) (ref 1.0–2.5)
ALT: 28 U/L (ref 6–29)
AST: 21 U/L (ref 10–30)
Albumin: 4.2 g/dL (ref 3.6–5.1)
Alkaline phosphatase (APISO): 61 U/L (ref 31–125)
BUN: 15 mg/dL (ref 7–25)
CO2: 25 mmol/L (ref 20–32)
Calcium: 9.3 mg/dL (ref 8.6–10.2)
Chloride: 102 mmol/L (ref 98–110)
Creat: 0.85 mg/dL (ref 0.50–0.97)
Globulin: 2.9 g/dL (calc) (ref 1.9–3.7)
Glucose, Bld: 96 mg/dL (ref 65–99)
Potassium: 3.2 mmol/L — ABNORMAL LOW (ref 3.5–5.3)
Sodium: 138 mmol/L (ref 135–146)
Total Bilirubin: 0.5 mg/dL (ref 0.2–1.2)
Total Protein: 7.1 g/dL (ref 6.1–8.1)
eGFR: 89 mL/min/{1.73_m2} (ref >=60)

## 2022-01-13 LAB — CBC WITH DIFFERENTIAL/PLATELET
Absolute Monocytes: 773 cells/uL (ref 200–950)
Basophils Absolute: 37 cells/uL (ref 0–200)
Basophils Relative: 0.4 %
Eosinophils Absolute: 221 cells/uL (ref 15–500)
Eosinophils Relative: 2.4 %
HCT: 43 % (ref 35.0–45.0)
Hemoglobin: 15.3 g/dL (ref 11.7–15.5)
Lymphs Abs: 2760 cells/uL (ref 850–3900)
MCH: 32.3 pg (ref 27.0–33.0)
MCHC: 35.6 g/dL (ref 32.0–36.0)
MCV: 90.7 fL (ref 80.0–100.0)
MPV: 11.5 fL (ref 7.5–12.5)
Monocytes Relative: 8.4 %
Neutro Abs: 5410 cells/uL (ref 1500–7800)
Neutrophils Relative %: 58.8 %
Platelets: 335 10*3/uL (ref 140–400)
RBC: 4.74 10*6/uL (ref 3.80–5.10)
RDW: 12.3 % (ref 11.0–15.0)
Total Lymphocyte: 30 %
WBC: 9.2 10*3/uL (ref 3.8–10.8)

## 2022-01-13 LAB — LIPASE: Lipase: 14 U/L (ref 7–60)

## 2022-01-13 MED ORDER — HYDROCORTISONE ACETATE 25 MG RE SUPP: 25.0000 mg | Freq: Two times a day (BID) | RECTAL | 2 refills | Status: DC | PRN

## 2022-01-13 NOTE — Patient Instructions (Signed)
Hemorrhoids °Hemorrhoids are swollen veins in and around the rectum or anus. There are two types of hemorrhoids: °Internal hemorrhoids. These occur in the veins that are just inside the rectum. They may poke through to the outside and become irritated and painful. °External hemorrhoids. These occur in the veins that are outside the anus and can be felt as a painful swelling or hard lump near the anus. °Most hemorrhoids do not cause serious problems, and they can be managed with home treatments such as diet and lifestyle changes. If home treatments do not help the symptoms, procedures can be done to shrink or remove the hemorrhoids. °What are the causes? °This condition is caused by increased pressure in the anal area. This pressure may result from various things, including: °Constipation. °Straining to have a bowel movement. °Diarrhea. °Pregnancy. °Obesity. °Sitting for long periods of time. °Heavy lifting or other activity that causes you to strain. °Anal sex. °Riding a bike for a long period of time. °What are the signs or symptoms? °Symptoms of this condition include: °Pain. °Anal itching or irritation. °Rectal bleeding. °Leakage of stool (feces). °Anal swelling. °One or more lumps around the anus. °How is this diagnosed? °This condition can often be diagnosed through a visual exam. Other exams or tests may also be done, such as: °An exam that involves feeling the rectal area with a gloved hand (digital rectal exam). °An exam of the anal canal that is done using a small tube (anoscope). °A blood test, if you have lost a significant amount of blood. °A test to look inside the colon using a flexible tube with a camera on the end (sigmoidoscopy or colonoscopy). °How is this treated? °This condition can usually be treated at home. However, various procedures may be done if dietary changes, lifestyle changes, and other home treatments do not help your symptoms. These procedures can help make the hemorrhoids smaller or  remove them completely. Some of these procedures involve surgery, and others do not. Common procedures include: °Rubber band ligation. Rubber bands are placed at the base of the hemorrhoids to cut off their blood supply. °Sclerotherapy. Medicine is injected into the hemorrhoids to shrink them. °Infrared coagulation. A type of light energy is used to get rid of the hemorrhoids. °Hemorrhoidectomy surgery. The hemorrhoids are surgically removed, and the veins that supply them are tied off. °Stapled hemorrhoidopexy surgery. The surgeon staples the base of the hemorrhoid to the rectal wall. °Follow these instructions at home: °Eating and drinking ° °Eat foods that have a lot of fiber in them, such as whole grains, beans, nuts, fruits, and vegetables. °Ask your health care provider about taking products that have added fiber (fiber supplements). °Reduce the amount of fat in your diet. You can do this by eating low-fat dairy products, eating less red meat, and avoiding processed foods. °Drink enough fluid to keep your urine pale yellow. °Managing pain and swelling ° °Take warm sitz baths for 20 minutes, 3-4 times a day to ease pain and discomfort. You may do this in a bathtub or using a portable sitz bath that fits over the toilet. °If directed, apply ice to the affected area. Using ice packs between sitz baths may be helpful. °Put ice in a plastic bag. °Place a towel between your skin and the bag. °Leave the ice on for 20 minutes, 2-3 times a day. °General instructions °Take over-the-counter and prescription medicines only as told by your health care provider. °Use medicated creams or suppositories as told. °Get regular exercise.   Ask your health care provider how much and what kind of exercise is best for you. In general, you should do moderate exercise for at least 30 minutes on most days of the week (150 minutes each week). This can include activities such as walking, biking, or yoga. °Go to the bathroom when you have  the urge to have a bowel movement. Do not wait. °Avoid straining to have bowel movements. °Keep the anal area dry and clean. Use wet toilet paper or moist towelettes after a bowel movement. °Do not sit on the toilet for long periods of time. This increases blood pooling and pain. °Keep all follow-up visits as told by your health care provider. This is important. °Contact a health care provider if you have: °Increasing pain and swelling that are not controlled by treatment or medicine. °Difficulty having a bowel movement, or you are unable to have a bowel movement. °Pain or inflammation outside the area of the hemorrhoids. °Get help right away if you have: °Uncontrolled bleeding from your rectum. °Summary °Hemorrhoids are swollen veins in and around the rectum or anus. °Most hemorrhoids can be managed with home treatments such as diet and lifestyle changes. °Taking warm sitz baths can help ease pain and discomfort. °In severe cases, procedures or surgery can be done to shrink or remove the hemorrhoids. °This information is not intended to replace advice given to you by your health care provider. Make sure you discuss any questions you have with your health care provider. °Document Revised: 04/17/2021 Document Reviewed: 04/17/2021 °Elsevier Patient Education © 2022 [employer] ° °

## 2022-01-13 NOTE — Telephone Encounter (Signed)
Patient scheduled.

## 2022-01-13 NOTE — Progress Notes (Signed)
?  HPI with pertinent ROS:  ? ?CC: Rectal bleeding ? ?HPI: ?Pleasant 41 year old female presenting today with complaints of rectal bleeding that has been occurring off and on for the last couple of months but has been much worse over the last week. Notes a history of IBS with diarrhea that she previously had evaluated by GI but never followed up. Has been managing symptoms at home but feels that things are getting much worse. Has had internal pain and pressure in the past along with the bleeding but these have been intermittent. Over the last week, has developed some external pain/pressure in the rectal and perineal area. Went to UC and was given a prescription for Proctofoam along with a strong recommendation to get in with GI for further evaluation. She has tried the Proctofoam but this was difficult to use and doesn't appear to have helped. Continues to see bright red blood when wiping and with bowel movements. Recent CBC normal (drawn at [facility]) and has not had any lightheadedness, syncope, etc to indicate anemia. Has been a bit gassier than usual but is not sure if this is related to her current issue or not.  ? ?I reviewed the past medical history, family history, social history, surgical history, and allergies today and no changes were needed.  Please see the problem list section below in epic for further details. ? ? ?Physical exam:  ? ?General: Well Developed, well nourished, and in no acute distress.  ?Neuro: Alert and oriented x3.  ?HEENT: Normocephalic, atraumatic.  ?Skin: Warm and dry. ?Cardiac: Regular rate and rhythm, no murmurs rubs or gallops, no lower extremity edema.  ?Respiratory: Clear to auscultation bilaterally. Not using accessory muscles, speaking in full sentences. ? ?Impression and Recommendations:   ? ?1. BRBPR (bright red blood per rectum) ?Referring to GI for further evaluation. Start Anusol suppositories BID x 7-10 days. Discussed FODMAP diet recommendations.  ?- Ambulatory referral to  Gastroenterology ? ?Return if symptoms worsen or fail to improve. ?___________________________________________ ?Thayer Ohm, DNP, APRN, FNP-BC ?Primary Care and Sports Medicine ?[facility] ?

## 2022-01-15 MED ORDER — LIDOCAINE 5 % EX OINT: 1.0000 "application " | TOPICAL_OINTMENT | Freq: Every day | CUTANEOUS | 0 refills | Status: DC

## 2022-01-16 NOTE — Telephone Encounter (Signed)
Refer elsewhere?  ?

## 2022-01-17 ENCOUNTER — Ambulatory Visit: Payer: BC Managed Care – PPO

## 2022-01-17 DIAGNOSIS — Z91018 Allergy to other foods: Secondary | ICD-10-CM | POA: Diagnosis not present

## 2022-01-17 DIAGNOSIS — K625 Hemorrhage of anus and rectum: Secondary | ICD-10-CM | POA: Diagnosis not present

## 2022-01-17 DIAGNOSIS — Z882 Allergy status to sulfonamides status: Secondary | ICD-10-CM | POA: Diagnosis not present

## 2022-01-17 DIAGNOSIS — Z885 Allergy status to narcotic agent status: Secondary | ICD-10-CM | POA: Diagnosis not present

## 2022-01-17 DIAGNOSIS — K644 Residual hemorrhoidal skin tags: Secondary | ICD-10-CM | POA: Diagnosis not present

## 2022-01-17 DIAGNOSIS — K645 Perianal venous thrombosis: Secondary | ICD-10-CM | POA: Diagnosis not present

## 2022-01-17 NOTE — Telephone Encounter (Signed)
Please sent referral to another provider, let Joy know if needs new urgent referral.  ?

## 2022-01-20 ENCOUNTER — Ambulatory Visit (INDEPENDENT_AMBULATORY_CARE_PROVIDER_SITE_OTHER): Payer: BC Managed Care – PPO | Admitting: Medical-Surgical

## 2022-01-20 ENCOUNTER — Encounter: Payer: Self-pay | Admitting: Medical-Surgical

## 2022-01-20 VITALS — BP 145/103 | HR 101 | Resp 101 | Ht 65.0 in | Wt 213.6 lb

## 2022-01-20 DIAGNOSIS — K625 Hemorrhage of anus and rectum: Secondary | ICD-10-CM

## 2022-01-20 DIAGNOSIS — K644 Residual hemorrhoidal skin tags: Secondary | ICD-10-CM

## 2022-01-20 DIAGNOSIS — I10 Essential (primary) hypertension: Secondary | ICD-10-CM

## 2022-01-20 MED ORDER — TRAMADOL HCL 50 MG PO TABS
50.0000 mg | ORAL_TABLET | Freq: Three times a day (TID) | ORAL | 0 refills | Status: AC | PRN
Start: 2022-01-20 — End: 2022-01-25

## 2022-01-20 NOTE — Progress Notes (Signed)
?  HPI with pertinent ROS:  ? ?CC: rectal bleeding follow up ? ?HPI: ?Pleasant 41 year old female presenting today for follow up on rectal bleeding. She is very frustrated and under considerable stress regarding this since she has been unable to find anyone to help with the situation. After her appointment on 3/27 where she was referred to GI, she heard from [facility] and they can't get her in until May. She ended up going to the ED where an exam showed that she had developed a thrombosed external hemorrhoid. It was felt that there was no benefit of I&D due to the length of time being greater than 2-3 days. She received another referral from there and was told to follow up with her PCP and GI/general surgery. She was told to take Tylenol and Ibuprofen for pain, continue the lidocaine ointment, and use the Anusol suppositories as prescribed. So far, nothing has helped with her discomfort and she is still bleeding. Her pain is now causing her to have difficulty walking and her job is very active. No fevers, chills, or other signs of infection.  ? ?I reviewed the past medical history, family history, social history, surgical history, and allergies today and no changes were needed.  Please see the problem list section below in epic for further details. ? ? ?Physical exam:  ? ?General: Well Developed, well nourished, and in no acute distress.  ?Neuro: Alert and oriented x3.  ?HEENT: Normocephalic, atraumatic.  ?Skin: Warm and dry. ?Cardiac: Regular rate and rhythm, no murmurs rubs or gallops, no lower extremity edema.  ?Respiratory: Clear to auscultation bilaterally. Not using accessory muscles, speaking in full sentences. ? ?Impression and Recommendations:   ? ?1. External hemorrhoid ?2. BRBPR (bright red blood per rectum) ?Re-entering referral for GI urgently. Adding an urgent referral to general surgery. Clinical team lead contacted [facility] and had patient added to the  Emergency/cancellation list. Advised patient of steps taken. Continue Tylenol and Ibuprofen as needed. Adding Tramadol 50mg  TID as needed.  ?- Ambulatory referral to General Surgery ?- Ambulatory referral to Gastroenterology ? ?3. Essential hypertension ?BP out of control today but patient is considerably, and understandably, upset. Plan to recheck at next visit and revisit the need for antihypertensives.  ? ?No follow-ups on file. ?___________________________________________ ? , DNP, APRN, FNP-BC ?Primary Care and Sports Medicine ?[facility] ?

## 2022-01-22 ENCOUNTER — Ambulatory Visit: Payer: BC Managed Care – PPO | Admitting: Medical-Surgical

## 2022-01-23 DIAGNOSIS — K219 Gastro-esophageal reflux disease without esophagitis: Secondary | ICD-10-CM | POA: Diagnosis not present

## 2022-01-23 DIAGNOSIS — R197 Diarrhea, unspecified: Secondary | ICD-10-CM | POA: Diagnosis not present

## 2022-01-23 DIAGNOSIS — Z791 Long term (current) use of non-steroidal anti-inflammatories (NSAID): Secondary | ICD-10-CM | POA: Diagnosis not present

## 2022-01-23 DIAGNOSIS — K921 Melena: Secondary | ICD-10-CM | POA: Diagnosis not present

## 2022-01-31 DIAGNOSIS — K921 Melena: Secondary | ICD-10-CM | POA: Diagnosis not present

## 2022-01-31 DIAGNOSIS — D128 Benign neoplasm of rectum: Secondary | ICD-10-CM | POA: Diagnosis not present

## 2022-01-31 DIAGNOSIS — K611 Rectal abscess: Secondary | ICD-10-CM | POA: Diagnosis not present

## 2022-01-31 DIAGNOSIS — D123 Benign neoplasm of transverse colon: Secondary | ICD-10-CM | POA: Diagnosis not present

## 2022-01-31 DIAGNOSIS — K56609 Unspecified intestinal obstruction, unspecified as to partial versus complete obstruction: Secondary | ICD-10-CM | POA: Diagnosis not present

## 2022-01-31 DIAGNOSIS — K6289 Other specified diseases of anus and rectum: Secondary | ICD-10-CM | POA: Diagnosis not present

## 2022-01-31 DIAGNOSIS — D125 Benign neoplasm of sigmoid colon: Secondary | ICD-10-CM | POA: Diagnosis not present

## 2022-02-04 DIAGNOSIS — K6289 Other specified diseases of anus and rectum: Secondary | ICD-10-CM | POA: Insufficient documentation

## 2022-02-05 ENCOUNTER — Encounter: Payer: Self-pay | Admitting: Medical-Surgical

## 2022-02-05 ENCOUNTER — Other Ambulatory Visit: Payer: Self-pay | Admitting: Obstetrics & Gynecology

## 2022-02-05 DIAGNOSIS — K635 Polyp of colon: Secondary | ICD-10-CM | POA: Diagnosis not present

## 2022-02-05 DIAGNOSIS — D128 Benign neoplasm of rectum: Secondary | ICD-10-CM | POA: Diagnosis not present

## 2022-02-05 DIAGNOSIS — F419 Anxiety disorder, unspecified: Secondary | ICD-10-CM | POA: Diagnosis not present

## 2022-02-05 DIAGNOSIS — Z79899 Other long term (current) drug therapy: Secondary | ICD-10-CM | POA: Diagnosis not present

## 2022-02-05 DIAGNOSIS — Z87891 Personal history of nicotine dependence: Secondary | ICD-10-CM | POA: Diagnosis not present

## 2022-02-05 DIAGNOSIS — K6289 Other specified diseases of anus and rectum: Secondary | ICD-10-CM | POA: Diagnosis not present

## 2022-02-05 DIAGNOSIS — I10 Essential (primary) hypertension: Secondary | ICD-10-CM | POA: Diagnosis not present

## 2022-02-05 DIAGNOSIS — D127 Benign neoplasm of rectosigmoid junction: Secondary | ICD-10-CM | POA: Diagnosis not present

## 2022-02-05 DIAGNOSIS — K219 Gastro-esophageal reflux disease without esophagitis: Secondary | ICD-10-CM | POA: Diagnosis not present

## 2022-02-05 MED ORDER — CLONAZEPAM 0.5 MG PO TABS
0.5000 mg | ORAL_TABLET | Freq: Every day | ORAL | 0 refills | Status: DC | PRN
Start: 2022-02-05 — End: 2022-02-13

## 2022-02-06 DIAGNOSIS — K6289 Other specified diseases of anus and rectum: Secondary | ICD-10-CM | POA: Diagnosis not present

## 2022-02-06 DIAGNOSIS — R590 Localized enlarged lymph nodes: Secondary | ICD-10-CM | POA: Diagnosis not present

## 2022-02-06 DIAGNOSIS — C20 Malignant neoplasm of rectum: Secondary | ICD-10-CM | POA: Diagnosis not present

## 2022-02-13 ENCOUNTER — Encounter: Payer: Self-pay | Admitting: Medical-Surgical

## 2022-02-13 ENCOUNTER — Ambulatory Visit (INDEPENDENT_AMBULATORY_CARE_PROVIDER_SITE_OTHER): Payer: BC Managed Care – PPO | Admitting: Medical-Surgical

## 2022-02-13 VITALS — BP 120/89 | HR 122 | Resp 20 | Ht 65.0 in | Wt 214.1 lb

## 2022-02-13 DIAGNOSIS — F419 Anxiety disorder, unspecified: Secondary | ICD-10-CM | POA: Diagnosis not present

## 2022-02-13 DIAGNOSIS — F439 Reaction to severe stress, unspecified: Secondary | ICD-10-CM | POA: Diagnosis not present

## 2022-02-13 MED ORDER — SERTRALINE HCL 25 MG PO TABS: 25.0000 mg | ORAL_TABLET | Freq: Every day | ORAL | 3 refills | Status: DC

## 2022-02-13 MED ORDER — CLONAZEPAM 0.5 MG PO TABS: 0.5000 mg | ORAL_TABLET | Freq: Every day | ORAL | 0 refills | Status: DC | PRN

## 2022-02-13 NOTE — Progress Notes (Signed)
?  HPI with pertinent ROS:  ? ?CC: Mood follow-up ? ?HPI: ?Pleasant 41 year old female presenting today for follow-up on mood.  She was started on Effexor 37.5 mg daily however was unable to tolerate this due to severe nausea.  She has previously taken Lexapro but that left her apathetic.  Notes that she has significant stress at this point that is completely unmanaged.  Has recently had some health concerns that have caused significant anxiety.  Also notes that her mother has moved back in with her to help which brings with it a different type of stress.  Her job is also a source of stress.  On top of all of this, her car is in the shop and she is having to use a rental in which she does not have the tools and equipment needed to complete her job.  She is open to trying a different medication but notes that she feels like she needs something to take the edge off in the meantime.  Previously used Klonopin to help with severe anxiety and notes that it worked well to take the edge off.  She was aware to use it sparingly and cognizant of the risk of dependence and tolerance. ? ?As an update, she was able to get in with GI and a colonoscopy was completed.  They did find a small mass which they biopsied with results showing aggressive precancerous changes.  She then saw surgeon who repeated the biopsy.  Notes that she is going to see an oncologist tomorrow and has surgery planned next month. ? ?I reviewed the past medical history, family history, social history, surgical history, and allergies today and no changes were needed.  Please see the problem list section below in epic for further details. ? ? ?Physical exam:  ? ?General: Well Developed, well nourished, and in no acute distress.  ?Neuro: Alert and oriented x3.  ?HEENT: Normocephalic, atraumatic.  ?Skin: Warm and dry. ?Cardiac: Regular rate and rhythm, no murmurs rubs or gallops, no lower extremity edema.  ?Respiratory: Clear to auscultation bilaterally. Not using  accessory muscles, speaking in full sentences. ? ?Impression and Recommendations:   ? ?1. Anxiety ?2. Stress ?Reviewed various options.  Starting sertraline 25 mg daily for the next 1 to 2 weeks.  If tolerating well, then okay to increase to 50 mg daily.  In the meantime, discussed short-term use of clonazepam.  I am willing to extend this for 1 to 2 months for very sparing use but after that, we will need to look at other options.  Patient verbalized understanding is agreeable to the plan. ? ? ?No follow-ups on file. ?___________________________________________ ?Thayer Ohm, DNP, APRN, FNP-BC ?Primary Care and Sports Medicine ?[facility] ?

## 2022-02-14 DIAGNOSIS — Z8673 Personal history of transient ischemic attack (TIA), and cerebral infarction without residual deficits: Secondary | ICD-10-CM | POA: Diagnosis not present

## 2022-02-14 DIAGNOSIS — K6289 Other specified diseases of anus and rectum: Secondary | ICD-10-CM | POA: Diagnosis not present

## 2022-02-19 DIAGNOSIS — K6289 Other specified diseases of anus and rectum: Secondary | ICD-10-CM | POA: Diagnosis not present

## 2022-02-19 DIAGNOSIS — Z789 Other specified health status: Secondary | ICD-10-CM | POA: Insufficient documentation

## 2022-02-19 DIAGNOSIS — E876 Hypokalemia: Secondary | ICD-10-CM | POA: Insufficient documentation

## 2022-02-19 DIAGNOSIS — Z87898 Personal history of other specified conditions: Secondary | ICD-10-CM | POA: Diagnosis not present

## 2022-02-19 DIAGNOSIS — Z01818 Encounter for other preprocedural examination: Secondary | ICD-10-CM | POA: Diagnosis not present

## 2022-02-19 DIAGNOSIS — I10 Essential (primary) hypertension: Secondary | ICD-10-CM | POA: Diagnosis not present

## 2022-02-20 DIAGNOSIS — I10 Essential (primary) hypertension: Secondary | ICD-10-CM | POA: Diagnosis not present

## 2022-02-20 DIAGNOSIS — K6289 Other specified diseases of anus and rectum: Secondary | ICD-10-CM | POA: Diagnosis not present

## 2022-02-21 ENCOUNTER — Ambulatory Visit: Payer: BC Managed Care – PPO | Admitting: Medical-Surgical

## 2022-03-04 DIAGNOSIS — E669 Obesity, unspecified: Secondary | ICD-10-CM | POA: Diagnosis not present

## 2022-03-04 DIAGNOSIS — K6289 Other specified diseases of anus and rectum: Secondary | ICD-10-CM | POA: Diagnosis not present

## 2022-03-04 DIAGNOSIS — Z91018 Allergy to other foods: Secondary | ICD-10-CM | POA: Diagnosis not present

## 2022-03-04 DIAGNOSIS — Z91041 Radiographic dye allergy status: Secondary | ICD-10-CM | POA: Diagnosis not present

## 2022-03-04 DIAGNOSIS — D126 Benign neoplasm of colon, unspecified: Secondary | ICD-10-CM | POA: Diagnosis not present

## 2022-03-04 DIAGNOSIS — K620 Anal polyp: Secondary | ICD-10-CM | POA: Diagnosis not present

## 2022-03-04 DIAGNOSIS — K219 Gastro-esophageal reflux disease without esophagitis: Secondary | ICD-10-CM | POA: Diagnosis not present

## 2022-03-04 DIAGNOSIS — Z882 Allergy status to sulfonamides status: Secondary | ICD-10-CM | POA: Diagnosis not present

## 2022-03-04 DIAGNOSIS — I10 Essential (primary) hypertension: Secondary | ICD-10-CM | POA: Diagnosis not present

## 2022-03-04 DIAGNOSIS — Z8673 Personal history of transient ischemic attack (TIA), and cerebral infarction without residual deficits: Secondary | ICD-10-CM | POA: Diagnosis not present

## 2022-03-04 DIAGNOSIS — F988 Other specified behavioral and emotional disorders with onset usually occurring in childhood and adolescence: Secondary | ICD-10-CM | POA: Diagnosis not present

## 2022-03-04 DIAGNOSIS — M797 Fibromyalgia: Secondary | ICD-10-CM | POA: Diagnosis not present

## 2022-03-04 DIAGNOSIS — Z87891 Personal history of nicotine dependence: Secondary | ICD-10-CM | POA: Diagnosis not present

## 2022-03-04 DIAGNOSIS — D128 Benign neoplasm of rectum: Secondary | ICD-10-CM | POA: Diagnosis not present

## 2022-03-04 DIAGNOSIS — Z888 Allergy status to other drugs, medicaments and biological substances status: Secondary | ICD-10-CM | POA: Diagnosis not present

## 2022-03-04 DIAGNOSIS — Z6833 Body mass index (BMI) 33.0-33.9, adult: Secondary | ICD-10-CM | POA: Diagnosis not present

## 2022-03-04 DIAGNOSIS — Z79899 Other long term (current) drug therapy: Secondary | ICD-10-CM | POA: Diagnosis not present

## 2022-03-04 DIAGNOSIS — Z885 Allergy status to narcotic agent status: Secondary | ICD-10-CM | POA: Diagnosis not present

## 2022-03-04 HISTORY — PX: TRANSANAL RECTAL RESECTION: SHX2562

## 2022-03-07 DIAGNOSIS — Z98891 History of uterine scar from previous surgery: Secondary | ICD-10-CM | POA: Diagnosis not present

## 2022-03-07 DIAGNOSIS — K219 Gastro-esophageal reflux disease without esophagitis: Secondary | ICD-10-CM | POA: Diagnosis not present

## 2022-03-07 DIAGNOSIS — R569 Unspecified convulsions: Secondary | ICD-10-CM | POA: Diagnosis not present

## 2022-03-07 DIAGNOSIS — K611 Rectal abscess: Secondary | ICD-10-CM | POA: Insufficient documentation

## 2022-03-07 DIAGNOSIS — Z882 Allergy status to sulfonamides status: Secondary | ICD-10-CM | POA: Diagnosis not present

## 2022-03-07 DIAGNOSIS — Z888 Allergy status to other drugs, medicaments and biological substances status: Secondary | ICD-10-CM | POA: Diagnosis not present

## 2022-03-07 DIAGNOSIS — I10 Essential (primary) hypertension: Secondary | ICD-10-CM | POA: Diagnosis not present

## 2022-03-07 DIAGNOSIS — Z885 Allergy status to narcotic agent status: Secondary | ICD-10-CM | POA: Diagnosis not present

## 2022-03-07 DIAGNOSIS — K589 Irritable bowel syndrome without diarrhea: Secondary | ICD-10-CM | POA: Diagnosis not present

## 2022-03-07 DIAGNOSIS — G43909 Migraine, unspecified, not intractable, without status migrainosus: Secondary | ICD-10-CM | POA: Diagnosis not present

## 2022-03-07 DIAGNOSIS — Z8673 Personal history of transient ischemic attack (TIA), and cerebral infarction without residual deficits: Secondary | ICD-10-CM | POA: Diagnosis not present

## 2022-03-07 DIAGNOSIS — Z79899 Other long term (current) drug therapy: Secondary | ICD-10-CM | POA: Diagnosis not present

## 2022-03-07 DIAGNOSIS — F419 Anxiety disorder, unspecified: Secondary | ICD-10-CM | POA: Diagnosis not present

## 2022-03-07 DIAGNOSIS — Z91041 Radiographic dye allergy status: Secondary | ICD-10-CM | POA: Diagnosis not present

## 2022-03-07 DIAGNOSIS — Z9889 Other specified postprocedural states: Secondary | ICD-10-CM | POA: Diagnosis not present

## 2022-03-07 DIAGNOSIS — Z9851 Tubal ligation status: Secondary | ICD-10-CM | POA: Diagnosis not present

## 2022-03-08 ENCOUNTER — Other Ambulatory Visit: Payer: Self-pay | Admitting: Medical-Surgical

## 2022-03-08 DIAGNOSIS — K219 Gastro-esophageal reflux disease without esophagitis: Secondary | ICD-10-CM

## 2022-03-23 NOTE — Progress Notes (Unsigned)
   Established Patient Office Visit  Subjective   Patient ID: Linda [name], female   DOB: [birth year] Age: 41 y.o. MRN: [id number]   No chief complaint on file.   HPI Pleasant 41 year old female presenting today for follow up on anxiety/depression. At our last visit, she was started on Sertraline after being unable to tolerate Effexor.   ROS    Objective:    There were no vitals filed for this visit.   Physical Exam    No results found for this or any previous visit (from the past 24 hour(s)).   {Labs (Optional):23779}  The ASCVD Risk score (Arnett DK, et al., 2019) failed to calculate for the following reasons:   The 2019 ASCVD risk score is only valid for ages 42 to 79   Assessment & Plan:   No problem-specific Assessment & Plan notes found for this encounter.   No follow-ups on file.  ___________________________________________ Clearnce Sorrel, DNP, APRN, FNP-BC Primary Care and [place]

## 2022-03-24 ENCOUNTER — Encounter: Payer: Self-pay | Admitting: Medical-Surgical

## 2022-03-24 ENCOUNTER — Ambulatory Visit (INDEPENDENT_AMBULATORY_CARE_PROVIDER_SITE_OTHER): Payer: BC Managed Care – PPO | Admitting: Medical-Surgical

## 2022-03-24 VITALS — BP 118/80 | HR 94 | Resp 20 | Ht 65.0 in | Wt 217.7 lb

## 2022-03-24 DIAGNOSIS — R208 Other disturbances of skin sensation: Secondary | ICD-10-CM | POA: Diagnosis not present

## 2022-03-24 DIAGNOSIS — L299 Pruritus, unspecified: Secondary | ICD-10-CM

## 2022-03-24 DIAGNOSIS — R590 Localized enlarged lymph nodes: Secondary | ICD-10-CM

## 2022-03-24 DIAGNOSIS — F419 Anxiety disorder, unspecified: Secondary | ICD-10-CM

## 2022-03-24 MED ORDER — SERTRALINE HCL 25 MG PO TABS: 25.0000 mg | ORAL_TABLET | Freq: Every day | ORAL | 3 refills | Status: DC

## 2022-03-24 NOTE — Assessment & Plan Note (Signed)
Recommend following through with dermatology.  Checking labs for autoimmune concerns

## 2022-03-24 NOTE — Assessment & Plan Note (Signed)
Unclear etiology.  Previous referral to dermatology was not follow-through on so would recommend reaching out to them regarding rescheduling.  For now we will check some labs to evaluate for autoimmune contributors.

## 2022-03-24 NOTE — Assessment & Plan Note (Signed)
Symptoms well controlled on sertraline 25 mg daily.  Continue current prescription but patient advised to let me know if she feels the medication becomes less effective over time.

## 2022-03-24 NOTE — Assessment & Plan Note (Signed)
>>  ASSESSMENT AND PLAN FOR ANXIETY WRITTEN ON 03/24/2022  5:15 PM BY Merion Grimaldo, NP  Symptoms well controlled on sertraline 25 mg daily.  Continue current prescription but patient advised to let me know if she feels the medication becomes less effective over time.

## 2022-03-24 NOTE — Assessment & Plan Note (Signed)
Checking labs to evaluate for autoimmune issues.

## 2022-03-25 LAB — LUPUS(12) PANEL
Anti Nuclear Antibody (ANA): NEGATIVE
C3 Complement: 158 mg/dL (ref 83–193)
C4 Complement: 24 mg/dL (ref 15–57)
ENA SM Ab Ser-aCnc: <1 AI
Rheumatoid fact SerPl-aCnc: <14 IU/mL (ref <14)
Ribosomal P Protein Ab: <1 AI
SM/RNP: <1 AI
SSA (Ro) (ENA) Antibody, IgG: <1 AI
SSB (La) (ENA) Antibody, IgG: <1 AI
Scleroderma (Scl-70) (ENA) Antibody, IgG: <1 AI
Thyroperoxidase Ab SerPl-aCnc: 8 IU/mL (ref <9)
ds DNA Ab: <1 IU/mL

## 2022-03-25 LAB — SEDIMENTATION RATE: Sed Rate: 14 mm/h (ref 0–20)

## 2022-03-25 LAB — C-REACTIVE PROTEIN: CRP: 4.8 mg/L (ref <8.0)

## 2022-04-02 ENCOUNTER — Emergency Department (INDEPENDENT_AMBULATORY_CARE_PROVIDER_SITE_OTHER)
Admission: RE | Admit: 2022-04-02 | Discharge: 2022-04-02 | Disposition: A | Discharge status: Home / Self Care | Payer: BC Managed Care – PPO | Source: Ambulatory Visit | Attending: Family Medicine | Admitting: Family Medicine

## 2022-04-02 VITALS — BP 127/91 | HR 99 | Temp 98.5°F | Resp 17 | Ht 65.0 in | Wt 206.0 lb

## 2022-04-02 DIAGNOSIS — R634 Abnormal weight loss: Secondary | ICD-10-CM | POA: Diagnosis not present

## 2022-04-02 DIAGNOSIS — R197 Diarrhea, unspecified: Secondary | ICD-10-CM | POA: Diagnosis not present

## 2022-04-02 DIAGNOSIS — E860 Dehydration: Secondary | ICD-10-CM | POA: Diagnosis not present

## 2022-04-02 MED ORDER — SODIUM CHLORIDE 0.9 % IV BOLUS
1000.0000 mL | Freq: Once | INTRAVENOUS | Status: AC
Start: 2022-04-02 — End: 2022-04-02
  Administered 2022-04-02: 1000 mL via INTRAVENOUS

## 2022-04-02 MED ORDER — ONDANSETRON HCL 4 MG/2ML IJ SOLN
4.0000 mg | Freq: Once | INTRAMUSCULAR | Status: AC
  Administered 2022-04-02: 4 mg via INTRAVENOUS

## 2022-04-02 MED ORDER — PROMETHAZINE HCL 25 MG PO TABS: 25.0000 mg | ORAL_TABLET | Freq: Four times a day (QID) | ORAL | 0 refills | Status: DC | PRN

## 2022-04-02 NOTE — ED Triage Notes (Addendum)
N/V/D x 5 days  General Surgeon cant see her until Friday  Zofran is not helping  Clear liquids & Hydro IV powder has not helped  Denies fever Abdominal cramping  !0 lb weight loss No one else is sick at home

## 2022-04-02 NOTE — Discharge Instructions (Signed)
Take Phenergan to reduce nausea and vomiting Try to increase fluid intake.  Add bland diet as soon as you are able Your test results should be available by tomorrow.  You will be called if the result is positive.  You can check your results on MyChart.  If the test for C. difficile is positive, you will need to take antibiotics Follow-up with your primary care doctor or surgeon later this week

## 2022-04-02 NOTE — ED Provider Notes (Signed)
[facility]    CSN: [id number] Arrival date & time: 04/02/22  3710      History   Chief Complaint Chief Complaint  Patient presents with   Diarrhea    HPI Linda [name] is a 41 y.o. female.   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  Post operatively developed an abscess requiring a second procedure for drainage.  Was on cipro 500 BID and metronidazole 500 TID for a week ending 03/12/22 She saw surgeon in follow up and is healing as expected She saw her PCP on 6/5 for other reasons and her weight was 217 Today she complains of abdominal pain, nausea, vomiting and diarrhea for the last 5 days.  She weighs 206.  Cannot keep anything down in spite of zofran.  Diarrhea is profuse, yellow, watery and malodorous. Patient states she feels very weak.  She states when she tries to stand she feels "wobbly" like she might faint.   Past Medical History:  Diagnosis Date   Anxiety    Depression    DVT (deep venous thrombosis) (HCC)    Hypertension    IBS (irritable bowel syndrome)    Interstitial cystitis    Migraine    Pap smear abnormality of cervix/human papillomavirus (HPV) positive    Seizures (HCC)    Stroke [facility])     Patient Active Problem List   Diagnosis Date Noted   Burning sensation of skin 03/24/2022   Pruritus of skin 03/24/2022   Axillary lymphadenopathy 03/24/2022   External hemorrhoid 01/20/2022   Interstitial cystitis 01/13/2022   Shingles outbreak 01/13/2022   Fracture of calcaneus, left, closed 12/19/2019   Migraine with aura and without status migrainosus, not intractable 12/06/2019   History of stroke 12/06/2019   History of DVT of lower extremity 12/06/2019   Chest pain 12/06/2019   Essential hypertension 12/06/2019   GAD (generalized anxiety disorder) 12/06/2019   BMI 34.0-34.9,adult 12/06/2019   Menorrhagia with irregular cycle 08/05/2019   Pap smear of cervix shows  high risk HPV present 09/09/2016   S/P tubal ligation 09/02/2016   Vitamin D deficiency 08/04/2016   S/P cesarean section 07/27/2016   Seizure (HCC) 01/02/2016   Family history of genetic disease 12/28/2015   ADD (attention deficit disorder) 05/01/2015   Anxiety 05/01/2015   Fibromyalgia 09/27/2012   IBS (irritable bowel syndrome) 01/01/2011   Palpitations 03/07/2010    Past Surgical History:  Procedure Laterality Date   CARPAL TUNNEL RELEASE     CESAREAN SECTION     INTERSTIM IMPLANT PLACEMENT     tenze     TUBAL LIGATION      OB History     Gravida  4   Para  2   Term      Preterm      AB  2   Living  2      SAB  2   IAB      Ectopic      Multiple      Live Births               Home Medications    Prior to Admission medications   Medication Sig Start Date End Date Taking? Authorizing Provider  chlorthalidone (HYGROTON) 25 MG tablet Take 1 tablet (25 mg total) by mouth daily. Patient not taking: Reported on 04/02/2022 12/12/21   Christen Butter, NP  clonazePAM (KLONOPIN) 0.5 MG tablet Take 1 tablet (0.5 mg total) by  mouth daily as needed for anxiety. USE VERY SPARINGLY 02/13/22   Christen ButterJessup, Joy, NP  meloxicam (MOBIC) 15 MG tablet ONE TABLET EVERY MORNING WITH A MEAL FOR 2 WEEKS, THEN DAILY AS NEEDED FOR PAIN. 10/18/21   Christen ButterJessup, Joy, NP  pantoprazole (PROTONIX) 20 MG tablet Take 20 mg by mouth daily.    [provider]  sertraline (ZOLOFT) 25 MG tablet Take 1 tablet (25 mg total) by mouth daily. 03/24/22   Christen ButterJessup, Joy, NP  SLYND 4 MG TABS TAKE 1 TABLET BY MOUTH EVERY DAY 02/06/22   Anyanwu, Jethro BastosUgonna A, MD  topiramate (TOPAMAX) 200 MG tablet TAKE 1 TABLET BY MOUTH EVERY DAY 08/22/21   Christen ButterJessup, Joy, NP  venlafaxine XR (EFFEXOR-XR) 37.5 MG 24 hr capsule Take 37.5 mg by mouth every morning. Patient not taking: Reported on 04/02/2022 03/13/22   [provider]    Family History Family History  Problem Relation Age of Onset   Hypertension Mother     Kidney failure Mother    Hypertension Father    Clotting disorder Father    Diabetes Father    Deep vein thrombosis Father    Stroke Maternal Grandmother    Prostate cancer Maternal Grandfather    Stroke Paternal Grandfather    Colon cancer Paternal Grandfather    Breast cancer Maternal Aunt    Factor V Leiden deficiency Maternal Aunt    Deep vein thrombosis Paternal Aunt    Breast cancer Maternal Great-grandmother     Social History Social History   Tobacco Use   Smoking status: Former   Smokeless tobacco: Never  Building services engineerVaping Use   Vaping Use: Never used  Substance Use Topics   Alcohol use: Yes    Alcohol/week: 7.0 standard drinks of alcohol    Types: 7 Glasses of wine per week   Drug use: Never     Allergies   Chocolate, Cocoa butter, Propoxyphene, Sulfa antibiotics, Lisinopril, Oxycodone, and Codeine   Review of Systems Review of Systems See HPI  Physical Exam Triage Vital Signs ED Triage Vitals  Enc Vitals Group     BP 04/02/22 1017 (!) 127/91     Pulse Rate 04/02/22 1017 99     Resp 04/02/22 1017 17     Temp 04/02/22 1017 98.5 F (36.9 C)     Temp Source 04/02/22 1017 Oral     SpO2 04/02/22 1017 98 %     Weight 04/02/22 1018 206 lb (93.4 kg)     Height 04/02/22 1018 5\' 5"  (1.651 m)     Head Circumference --      Peak Flow --      Pain Score 04/02/22 1018 8     Pain Loc --      Pain Edu? --      Excl. in GC? --    No data found.  Updated Vital Signs BP (!) 127/91 (BP Location: Right Arm)   Pulse 99   Temp 98.5 F (36.9 C) (Oral)   Resp 17   Ht 5\' 5"  (1.651 m)   Wt 93.4 kg   SpO2 98%   BMI 34.28 kg/m      Physical Exam Constitutional:      General: She is not in acute distress.    Appearance: She is well-developed. She is ill-appearing.  HENT:     Head: Normocephalic and atraumatic.     Mouth/Throat:     Mouth: Mucous membranes are dry.  Eyes:     Conjunctiva/sclera: Conjunctivae normal.  Pupils: Pupils are equal, round, and reactive  to light.  Cardiovascular:     Rate and Rhythm: Normal rate and regular rhythm.     Heart sounds: Normal heart sounds.  Pulmonary:     Effort: Pulmonary effort is normal. No respiratory distress.     Breath sounds: Normal breath sounds.  Abdominal:     General: There is no distension.     Palpations: Abdomen is soft.     Tenderness: There is abdominal tenderness. There is no guarding or rebound.     Comments: Abdomen is soft.  Bowel sounds are active.  There is generalized tenderness to deep palpation  Musculoskeletal:        General: Normal range of motion.     Cervical back: Normal range of motion.  Skin:    General: Skin is warm and dry.  Neurological:     Mental Status: She is alert.  Psychiatric:        Mood and Affect: Mood normal.        Behavior: Behavior normal.      UC Treatments / Results  Labs (all labs ordered are listed, but only abnormal results are displayed) Labs Reviewed - No data to display  EKG   Radiology No results found.  Procedures Procedures (including critical care time)  Medications Ordered in UC Medications - No data to display  Initial Impression / Assessment and Plan / UC Course  I have reviewed the triage vital signs and the nursing notes.  Pertinent labs & imaging results that were available during my care of the patient were reviewed by me and considered in my medical decision making (see chart for details).     Because of patient's orthostatic symptoms and dry mouth I am going to give her a liter of fluids.  I think this will help her towards recovery. With her recent antibiotic usage I do have concern for C. difficile, especially since she describes her diarrhea as watery, yellow, and malodorous 10 pound weight loss in a week and a half is concerning.  Early follow-up with primary care is recommended. C. difficile testing is done and we will call for results are positive Since she says the Zofran is not helping with her nausea  vomiting will prescribe Phenergan Final Clinical Impressions(s) / UC Diagnoses   Final diagnoses:  None   Discharge Instructions   None    ED Prescriptions   None    PDMP not reviewed this encounter.   Eustace Moore, MD 04/02/22 1340

## 2022-04-03 ENCOUNTER — Telehealth: Payer: Self-pay

## 2022-04-03 ENCOUNTER — Other Ambulatory Visit: Payer: Self-pay | Admitting: Medical-Surgical

## 2022-04-03 LAB — C. DIFFICILE GDH AND TOXIN A/B
GDH ANTIGEN: DETECTED
MICRO NUMBER:: 13524736
SPECIMEN QUALITY:: ADEQUATE
TOXIN A AND B: DETECTED

## 2022-04-03 MED ORDER — VANCOMYCIN HCL 125 MG PO CAPS: 125.0000 mg | ORAL_CAPSULE | Freq: Four times a day (QID) | ORAL | 0 refills | Status: AC

## 2022-04-03 NOTE — Telephone Encounter (Signed)
From: Linda [name]  Sent: 04/03/2022  11:09 AM EDT  To: [facility]  Subject: Appointment Request                              Appointment Request From: Linda [name]    With Provider: Christen Butter, NP [facility]]    Preferred Date Range: 04/04/2022 - 04/07/2022    Preferred Times: Any Time    Reason for visit: Office Visit    Comments:  Yeah, for five days straight. I've lost 10 pounds. Going to urgent care and had IV for dehydration. No relief no results. Don't know why I'm sick.    Attempted to contact pt.  No answer.  LVM for pt to return call.  Tiajuana Amass, CMA

## 2022-04-03 NOTE — Telephone Encounter (Signed)
Spoke with pt who states that she was seen in UC yesterday for same issues.  Per Dr. Lindaann Slough note, there was concern for C. Diff.  Lab results were positive for c. Diff.  Pt states that she is still experiencing symptoms:  1 vomiting episode since yesterday, 5 episodes of diarrhea, watery yellow stools.  Pt denies fever or chills.  Per Christen Butter, DNP pt instructed to eat a BRAT diet, increase fluid intake, to not use anti-diarrheal products.  Pt informed of Vancomycin RX sent to pharmacy.  Also discussed with pt red flag symptoms that would require UC/ED visit.  Sent pt MyChart message with home precaution sheet from the CDC regarding c. Diff.  Pt expressed understanding.  Tiajuana Amass, CMA

## 2022-06-18 ENCOUNTER — Other Ambulatory Visit: Payer: Self-pay | Admitting: Medical-Surgical

## 2022-06-20 NOTE — Telephone Encounter (Signed)
I'm not showing these Rx on her current medication list.  Last office visit 03/24/2022

## 2022-06-24 ENCOUNTER — Encounter: Payer: Self-pay | Admitting: Medical-Surgical

## 2022-06-24 ENCOUNTER — Ambulatory Visit (INDEPENDENT_AMBULATORY_CARE_PROVIDER_SITE_OTHER): Payer: BC Managed Care – PPO | Admitting: Medical-Surgical

## 2022-06-24 VITALS — BP 130/82 | HR 94 | Resp 20 | Ht 65.0 in | Wt 222.5 lb

## 2022-06-24 DIAGNOSIS — K589 Irritable bowel syndrome without diarrhea: Secondary | ICD-10-CM | POA: Diagnosis not present

## 2022-06-24 DIAGNOSIS — I10 Essential (primary) hypertension: Secondary | ICD-10-CM | POA: Diagnosis not present

## 2022-06-24 DIAGNOSIS — K6289 Other specified diseases of anus and rectum: Secondary | ICD-10-CM | POA: Diagnosis not present

## 2022-06-24 DIAGNOSIS — E669 Obesity, unspecified: Secondary | ICD-10-CM

## 2022-06-24 DIAGNOSIS — F411 Generalized anxiety disorder: Secondary | ICD-10-CM

## 2022-06-24 DIAGNOSIS — E66812 Obesity, class 2: Secondary | ICD-10-CM | POA: Insufficient documentation

## 2022-06-24 MED ORDER — SEMAGLUTIDE-WEIGHT MANAGEMENT 2.4 MG/0.75ML ~~LOC~~ SOAJ: 2.4000 mg | SUBCUTANEOUS | 0 refills | Status: AC

## 2022-06-24 MED ORDER — ONDANSETRON 8 MG PO TBDP: 8.0000 mg | ORAL_TABLET | Freq: Three times a day (TID) | ORAL | 3 refills | Status: DC | PRN

## 2022-06-24 MED ORDER — SEMAGLUTIDE-WEIGHT MANAGEMENT 0.25 MG/0.5ML ~~LOC~~ SOAJ
0.2500 mg | SUBCUTANEOUS | 0 refills | Status: AC
Start: 2022-06-24 — End: 2022-07-22

## 2022-06-24 MED ORDER — SEMAGLUTIDE-WEIGHT MANAGEMENT 0.5 MG/0.5ML ~~LOC~~ SOAJ: 0.5000 mg | SUBCUTANEOUS | 0 refills | Status: AC

## 2022-06-24 MED ORDER — SEMAGLUTIDE-WEIGHT MANAGEMENT 1 MG/0.5ML ~~LOC~~ SOAJ: 1.0000 mg | SUBCUTANEOUS | 0 refills | Status: DC

## 2022-06-24 MED ORDER — SEMAGLUTIDE-WEIGHT MANAGEMENT 1.7 MG/0.75ML ~~LOC~~ SOAJ: 1.7000 mg | SUBCUTANEOUS | 0 refills | Status: AC

## 2022-06-24 NOTE — Progress Notes (Signed)
Established Patient Office Visit  Subjective   Patient ID: Linda [name], female   DOB: Feb 18, 1981 Age: 41 y.o. MRN: [id number]   Chief Complaint  Patient presents with   Follow-up   HPI Pleasant 41 year old female presenting today for the following:  Hypertension: Not currently taking any medication for blood pressure.  Not regularly checking at home.  Has been working on healthy dietary options, lower sodium, and regular intentional exercise with a goal for weight loss but is very frustrated with limited to no progress. Denies CP, SOB, palpitations, lower extremity edema, dizziness, headaches, or vision changes.  Mood: Taking sertraline 25 mg daily, tolerating well without side effects.  She is currently happy with her dose and does not want make any changes.  Does note some intermittent issues with stress related to her job but feels this is manageable.  Does continue to have issues sleeping at night but will not take any prescription sleep medications due to a history of bad reactions.  She has tried valerian root in the past but not recently.  Using melatonin which does seem to help some but still only averages 4-5 hours per night.  Weight: Continues to try and lose weight but has gotten very downhearted over this issue.  She is doing visits to the gym 3 times weekly and participating in both cardio and strength training activities.  She has taken out red meat and fast food in her diet.  She is still drinking sodas but this is her only source of caffeine so this is hard to give up.  Eating more salads, fresh fruits, fish, nuts, and healthier snacks.  She eats dinner and does not eat after 7 PM.  She has cut back on alcohol consumption to 1-2 drinks during the week and then as indicated on the weekends depending on her activities.  Is interested in options for medications that may help with weight.   Objective:    Vitals:   06/24/22 1434  BP: 130/82  Pulse: 94  Resp: 20  Height: 5\' 5"   (1.651 m)  Weight: 222 lb 8 oz (100.9 kg)  SpO2: 98%  BMI (Calculated): 37.03   Physical Exam Vitals and nursing note reviewed.  Constitutional:      General: She is not in acute distress.    Appearance: Normal appearance. She is not ill-appearing.  HENT:     Head: Normocephalic and atraumatic.  Cardiovascular:     Rate and Rhythm: Normal rate and regular rhythm.     Pulses: Normal pulses.     Heart sounds: Normal heart sounds.  Pulmonary:     Effort: Pulmonary effort is normal. No respiratory distress.     Breath sounds: Normal breath sounds. No wheezing, rhonchi or rales.  Skin:    General: Skin is warm and dry.  Neurological:     Mental Status: She is alert and oriented to person, place, and time.  Psychiatric:        Mood and Affect: Mood normal.        Behavior: Behavior normal.        Thought Content: Thought content normal.        Judgment: Judgment normal.   No results found for this or any previous visit (from the past 24 hour(s)).     The ASCVD Risk score (Arnett DK, et al., 2019) failed to calculate for the following reasons:   The 2019 ASCVD risk score is only valid for ages 53 to 26   Assessment &  Plan:   1. GAD (generalized anxiety disorder) Continue sertraline 25 mg daily.  Discussed various options to help with stress and insomnia.  Consider adding magnesium at bedtime to help with sleep.  Discussed using valerian root and/or kava on occasion to see if this is beneficial.  2. Essential hypertension Blood pressure at goal today without medication.  Recommend continuing regular intentional exercise, low-sodium diet, and weight loss to healthy weight.  3. Irritable bowel syndrome, unspecified type 4. Rectal mass Referring to GI as well as colorectal surgery to get her in touch with the providers at her request for second opinions.  She would like to avoid at the [facility] health system so please make sure to send this to a [place] facility. - Ambulatory  referral to Colorectal Surgery  5. Obesity, Class II, BMI 35-39.9 Discussed various options for weight loss.  She does seem to be making changes in her diet and lifestyle but has not seen any benefit.  Discussed [place] and insurance coverage.  We will go ahead and order Wegovy today to see if this will be approved in the setting of class II obesity, hyperlipidemia, and hypertension. Sending in Zofran to use as needed if the medication gets approved and she has any significant issues.  Return in about 3 months (around 09/23/2022) for weight check. ___________________________________________ Thayer Ohm, DNP, APRN, FNP-BC Primary Care and Sports Medicine [facility] [place]

## 2022-06-26 ENCOUNTER — Other Ambulatory Visit: Payer: Self-pay | Admitting: Medical-Surgical

## 2022-06-26 DIAGNOSIS — G43109 Migraine with aura, not intractable, without status migrainosus: Secondary | ICD-10-CM

## 2022-07-01 ENCOUNTER — Encounter: Payer: Self-pay | Admitting: Medical-Surgical

## 2022-07-08 ENCOUNTER — Telehealth: Payer: Self-pay

## 2022-07-08 ENCOUNTER — Other Ambulatory Visit: Payer: Self-pay | Admitting: Medical-Surgical

## 2022-07-08 NOTE — Telephone Encounter (Addendum)
Initiated Prior authorization [id number] 0.25MG /0.5ML auto-injectors Via: Covermymeds Case/Key:[id number] Status: approved  as of 07/08/22 Reason:is approved through 02/06/2023 Notified Pt via: Mychart

## 2022-07-22 ENCOUNTER — Encounter: Payer: Self-pay | Admitting: Medical-Surgical

## 2022-07-22 ENCOUNTER — Ambulatory Visit (INDEPENDENT_AMBULATORY_CARE_PROVIDER_SITE_OTHER): Payer: BC Managed Care – PPO | Admitting: Medical-Surgical

## 2022-07-22 VITALS — BP 128/87 | HR 72 | Resp 20 | Ht 65.0 in | Wt 226.0 lb

## 2022-07-22 DIAGNOSIS — I10 Essential (primary) hypertension: Secondary | ICD-10-CM

## 2022-07-22 DIAGNOSIS — E669 Obesity, unspecified: Secondary | ICD-10-CM | POA: Diagnosis not present

## 2022-07-22 MED ORDER — SEMAGLUTIDE (2 MG/DOSE) 8 MG/3ML ~~LOC~~ SOPN: PEN_INJECTOR | SUBCUTANEOUS | 3 refills | Status: DC

## 2022-07-22 MED ORDER — CHLORTHALIDONE 25 MG PO TABS: 25.0000 mg | ORAL_TABLET | Freq: Every day | ORAL | 3 refills | Status: DC

## 2022-07-22 MED ORDER — "INSULIN SYRINGE 31G X 5/16"" 0.5 ML MISC": 1 refills | Status: DC

## 2022-07-22 NOTE — Progress Notes (Signed)
Established Patient Office Visit  Subjective   Patient ID: Linda [name], female   DOB: 05/12/1981 Age: 41 y.o. MRN: [id number]   Chief Complaint  Patient presents with   Hypertension    HPI Pleasant 41 year old female presenting today for the following:  Hypertension: Has been taking chlorthalidone 25 mg daily, tolerating well without side effects.  Ran out of her medication approximately 2 weeks ago.  Had previously reported that she was not taking this but she is not sure why.  Not regularly checking her blood pressure.  Does aim for a low-sodium diet.  Not getting much exercise.  Has had bilateral lower extremity edema since she ran out of the chlorthalidone.  Has also started to have more headaches.  No chest pains, shortness of breath, dizziness, vision changes, or syncopal episodes.  Weight concerns: Was prescribed Wegovy and we finally got it approved for her.  Unfortunately, the pharmacies in her area are on backorder and have been so since July for the lower starting doses of the medication.  She is very concerned about her weight and interested in what options there may be.   Objective:    Vitals:   07/22/22 1032  BP: 128/87  Pulse: 72  Resp: 20  Height: 5\' 5"  (1.651 m)  Weight: 226 lb (102.5 kg)  SpO2: 99%  BMI (Calculated): 37.61    Physical Exam Vitals and nursing note reviewed.  Constitutional:      General: She is not in acute distress.    Appearance: Normal appearance. She is not ill-appearing.  HENT:     Head: Normocephalic and atraumatic.  Cardiovascular:     Rate and Rhythm: Normal rate and regular rhythm.     Pulses: Normal pulses.     Heart sounds: Normal heart sounds.  Pulmonary:     Effort: Pulmonary effort is normal. No respiratory distress.     Breath sounds: Normal breath sounds. No wheezing, rhonchi or rales.  Skin:    General: Skin is warm and dry.  Neurological:     Mental Status: She is alert and oriented to person, place, and time.   Psychiatric:        Mood and Affect: Mood normal.        Behavior: Behavior normal.        Thought Content: Thought content normal.        Judgment: Judgment normal.   No results found for this or any previous visit (from the past 24 hour(s)).     The ASCVD Risk score (Arnett DK, et al., 2019) failed to calculate for the following reasons:   The 2019 ASCVD risk score is only valid for ages 86 to 73   Assessment & Plan:   1. Essential hypertension Blood pressure is not too bad today although her diastolic is a little above goal.  Resume chlorthalidone 25 mg daily.  Recommend monitoring blood pressure a couple of times a week with a goal of 130/80 or less.  Continue low-sodium diet.  Increase intentional exercise and continue with a goal for weight loss.  2. Obesity, Class II, BMI 35-39.9 Reviewed various options.  Unfortunately her insurance is the limiting factor.  With [facility] on back order, there is little to be done since we cannot start safely with higher doses.  We did briefly discuss using a compounded semaglutide and the associated out-of-pocket cost.  Reports that her mom is a nurse and can help her with drawing of the medication if needed.  She  is amenable to trying this for a few months so [facility]  She will let me know if there are any issues getting the medication or if she has any significant side effects/intolerances.  Return in about 3 months (around 10/22/2022) for weight check/HTN follow up.  Okay to return in 1 month if desired to make sure that she is doing well on the medication.  ___________________________________________ Thayer Ohm, DNP, APRN, FNP-BC Primary Care and Sports Medicine [facility] [place]

## 2022-08-12 DIAGNOSIS — K6289 Other specified diseases of anus and rectum: Secondary | ICD-10-CM | POA: Diagnosis not present

## 2022-08-13 DIAGNOSIS — D369 Benign neoplasm, unspecified site: Secondary | ICD-10-CM | POA: Diagnosis not present

## 2022-08-13 DIAGNOSIS — R197 Diarrhea, unspecified: Secondary | ICD-10-CM | POA: Diagnosis not present

## 2022-08-19 ENCOUNTER — Ambulatory Visit: Payer: BC Managed Care – PPO | Admitting: Medical-Surgical

## 2022-08-28 DIAGNOSIS — E876 Hypokalemia: Secondary | ICD-10-CM | POA: Diagnosis not present

## 2022-08-28 DIAGNOSIS — C20 Malignant neoplasm of rectum: Secondary | ICD-10-CM | POA: Diagnosis not present

## 2022-08-28 DIAGNOSIS — Z79899 Other long term (current) drug therapy: Secondary | ICD-10-CM | POA: Diagnosis not present

## 2022-08-28 DIAGNOSIS — G43909 Migraine, unspecified, not intractable, without status migrainosus: Secondary | ICD-10-CM | POA: Diagnosis not present

## 2022-08-28 DIAGNOSIS — Z882 Allergy status to sulfonamides status: Secondary | ICD-10-CM | POA: Diagnosis not present

## 2022-08-28 DIAGNOSIS — F419 Anxiety disorder, unspecified: Secondary | ICD-10-CM | POA: Diagnosis not present

## 2022-08-28 DIAGNOSIS — R197 Diarrhea, unspecified: Secondary | ICD-10-CM | POA: Diagnosis not present

## 2022-08-28 DIAGNOSIS — E669 Obesity, unspecified: Secondary | ICD-10-CM | POA: Diagnosis not present

## 2022-08-28 DIAGNOSIS — Z885 Allergy status to narcotic agent status: Secondary | ICD-10-CM | POA: Diagnosis not present

## 2022-08-28 DIAGNOSIS — K529 Noninfective gastroenteritis and colitis, unspecified: Secondary | ICD-10-CM | POA: Diagnosis not present

## 2022-08-28 DIAGNOSIS — Z6834 Body mass index (BMI) 34.0-34.9, adult: Secondary | ICD-10-CM | POA: Diagnosis not present

## 2022-08-28 DIAGNOSIS — Z8619 Personal history of other infectious and parasitic diseases: Secondary | ICD-10-CM | POA: Diagnosis not present

## 2022-08-28 DIAGNOSIS — Z91048 Other nonmedicinal substance allergy status: Secondary | ICD-10-CM | POA: Diagnosis not present

## 2022-08-28 DIAGNOSIS — Z86010 Personal history of colonic polyps: Secondary | ICD-10-CM | POA: Diagnosis not present

## 2022-08-28 DIAGNOSIS — K219 Gastro-esophageal reflux disease without esophagitis: Secondary | ICD-10-CM | POA: Diagnosis not present

## 2022-08-28 DIAGNOSIS — Z888 Allergy status to other drugs, medicaments and biological substances status: Secondary | ICD-10-CM | POA: Diagnosis not present

## 2022-08-28 DIAGNOSIS — D369 Benign neoplasm, unspecified site: Secondary | ICD-10-CM | POA: Diagnosis not present

## 2022-08-28 DIAGNOSIS — I10 Essential (primary) hypertension: Secondary | ICD-10-CM | POA: Diagnosis not present

## 2022-08-28 DIAGNOSIS — N289 Disorder of kidney and ureter, unspecified: Secondary | ICD-10-CM | POA: Diagnosis not present

## 2022-08-28 DIAGNOSIS — Z91018 Allergy to other foods: Secondary | ICD-10-CM | POA: Diagnosis not present

## 2022-08-28 DIAGNOSIS — Z87891 Personal history of nicotine dependence: Secondary | ICD-10-CM | POA: Diagnosis not present

## 2022-08-29 DIAGNOSIS — E876 Hypokalemia: Secondary | ICD-10-CM | POA: Diagnosis not present

## 2022-08-29 DIAGNOSIS — Z8669 Personal history of other diseases of the nervous system and sense organs: Secondary | ICD-10-CM | POA: Insufficient documentation

## 2022-09-03 ENCOUNTER — Encounter: Payer: Self-pay | Admitting: Medical-Surgical

## 2022-09-03 ENCOUNTER — Ambulatory Visit (INDEPENDENT_AMBULATORY_CARE_PROVIDER_SITE_OTHER): Payer: BC Managed Care – PPO | Admitting: Medical-Surgical

## 2022-09-03 VITALS — BP 115/82 | HR 77 | Resp 20 | Ht 65.0 in | Wt 218.0 lb

## 2022-09-03 DIAGNOSIS — E876 Hypokalemia: Secondary | ICD-10-CM

## 2022-09-03 DIAGNOSIS — I10 Essential (primary) hypertension: Secondary | ICD-10-CM | POA: Diagnosis not present

## 2022-09-03 DIAGNOSIS — E559 Vitamin D deficiency, unspecified: Secondary | ICD-10-CM

## 2022-09-03 LAB — BASIC METABOLIC PANEL WITH GFR
BUN: 10 mg/dL (ref 7–25)
CO2: 26 mmol/L (ref 20–32)
Calcium: 9.4 mg/dL (ref 8.6–10.2)
Chloride: 102 mmol/L (ref 98–110)
Creat: 0.88 mg/dL (ref 0.50–0.97)
Glucose, Bld: 82 mg/dL (ref 65–99)
Potassium: 3.5 mmol/L (ref 3.5–5.3)
Sodium: 138 mmol/L (ref 135–146)
eGFR: 86 mL/min/{1.73_m2} (ref >=60)

## 2022-09-03 MED ORDER — VITAMIN D (ERGOCALCIFEROL) 1.25 MG (50000 UNIT) PO CAPS: 50000.0000 [IU] | ORAL_CAPSULE | ORAL | 0 refills | Status: DC

## 2022-09-03 NOTE — Progress Notes (Signed)
Established Patient Office Visit  Subjective   Patient ID: Linda [name], female   DOB: October 12, 1981 Age: 41 y.o. MRN: [id number]   Chief Complaint  Patient presents with   Follow-up   LOW POTASSIUM   HPI Pleasant 41 year old female presenting for hospital discharge follow up after being seen on 08/28/2022 in the ED for hypokalemia. Her potassium at that time was 2.4, asymptomatic. She was given potassium replacement and discharged home with instructions to stop Chlorthalidone and start Amlodipine for BP management. Today, she reports that she stopped the Chlorthalidone as recommended but wanted to discuss medications with me since we have been working together on BP management. She also reports that the MD tried to put her on Lisinopril first when it's on her allergy list. This shook her trust in his decision making. She also was prescribed oral potassium replacement but did not get this filled. Instead, she picked up an OTC potassium supplement and has been using drink additives that contain potassium. No concerning symptoms today.    Labs checked in the hospital showed a vitamin D of 15. She is not currently taking a supplement or MVI that contains vitamin D.   Objective:    Vitals:   09/03/22 0932  BP: 115/82  Pulse: 77  Resp: 20  Height: 5\' 5"  (1.651 m)  Weight: 218 lb (98.9 kg)  SpO2: 99%  BMI (Calculated): 36.28   Physical Exam Vitals and nursing note reviewed.  Constitutional:      General: She is not in acute distress.    Appearance: Normal appearance. She is not ill-appearing.  HENT:     Head: Normocephalic and atraumatic.  Cardiovascular:     Rate and Rhythm: Normal rate and regular rhythm.     Pulses: Normal pulses.     Heart sounds: Normal heart sounds.  Pulmonary:     Effort: Pulmonary effort is normal. No respiratory distress.     Breath sounds: Normal breath sounds. No wheezing, rhonchi or rales.  Skin:    General: Skin is warm and dry.  Neurological:      Mental Status: She is alert and oriented to person, place, and time.  Psychiatric:        Mood and Affect: Mood normal.        Behavior: Behavior normal.        Thought Content: Thought content normal.        Judgment: Judgment normal.   No results found for this or any previous visit (from the past 24 hour(s)).     The ASCVD Risk score (Arnett DK, et al., 2019) failed to calculate for the following reasons:   The 2019 ASCVD risk score is only valid for ages 76 to 73   Assessment & Plan:   1. Hypokalemia Rechecking BMP today.  - BASIC METABOLIC PANEL WITH GFR  2. Essential hypertension BP at goal without medication for the last couple of days. Recommend monitoring BP at home with a goal of 130/80 or less. If her BP starts to trend up, start Amlodipine at 2.5mg  (1/2 tablet) daily and continue to monitor. After 2 weeks, if BP not at or below goal, increase to 5mg  daily. We will touch base via MyChart message in 2 weeks to see how things are going.  3. Vitamin D deficiency Adding high dose vitamin D replacement weekly for 12 weeks then recommend switching to OTC vitamin D 1000 units daily thereafter.   Return if symptoms worsen or fail to improve.  ___________________________________________  Clearnce Sorrel, DNP, APRN, FNP-BC Primary Care and [place]

## 2022-09-12 ENCOUNTER — Other Ambulatory Visit: Payer: Self-pay | Admitting: Medical-Surgical

## 2022-09-12 DIAGNOSIS — K219 Gastro-esophageal reflux disease without esophagitis: Secondary | ICD-10-CM

## 2022-09-15 ENCOUNTER — Other Ambulatory Visit: Payer: Self-pay | Admitting: Medical-Surgical

## 2022-09-15 ENCOUNTER — Encounter: Payer: Self-pay | Admitting: Medical-Surgical

## 2022-09-15 DIAGNOSIS — R52 Pain, unspecified: Secondary | ICD-10-CM

## 2022-09-16 ENCOUNTER — Other Ambulatory Visit: Payer: Self-pay

## 2022-09-16 MED ORDER — WEGOVY 1 MG/0.5ML ~~LOC~~ SOAJ: 1.0000 mg | SUBCUTANEOUS | 2 refills | Status: DC

## 2022-09-16 MED ORDER — PANTOPRAZOLE SODIUM 40 MG PO TBEC: 40.0000 mg | DELAYED_RELEASE_TABLET | Freq: Every day | ORAL | 0 refills | Status: DC

## 2022-09-16 NOTE — Telephone Encounter (Signed)
Pended, please sign if appropriate.  Not on current medication list, but it appears she is still taking.

## 2022-09-24 ENCOUNTER — Encounter: Payer: Self-pay | Admitting: Medical-Surgical

## 2022-09-25 DIAGNOSIS — R748 Abnormal levels of other serum enzymes: Secondary | ICD-10-CM | POA: Diagnosis not present

## 2022-09-25 DIAGNOSIS — E559 Vitamin D deficiency, unspecified: Secondary | ICD-10-CM | POA: Diagnosis not present

## 2022-09-25 DIAGNOSIS — R197 Diarrhea, unspecified: Secondary | ICD-10-CM | POA: Diagnosis not present

## 2022-09-26 ENCOUNTER — Other Ambulatory Visit: Payer: Self-pay | Admitting: Medical-Surgical

## 2022-09-26 DIAGNOSIS — E876 Hypokalemia: Secondary | ICD-10-CM

## 2022-09-26 MED ORDER — POTASSIUM CHLORIDE CRYS ER 20 MEQ PO TBCR: EXTENDED_RELEASE_TABLET | ORAL | 0 refills | Status: DC

## 2022-10-06 DIAGNOSIS — E876 Hypokalemia: Secondary | ICD-10-CM | POA: Diagnosis not present

## 2022-10-07 ENCOUNTER — Ambulatory Visit (INDEPENDENT_AMBULATORY_CARE_PROVIDER_SITE_OTHER): Payer: BC Managed Care – PPO | Admitting: Medical-Surgical

## 2022-10-07 ENCOUNTER — Encounter: Payer: Self-pay | Admitting: Medical-Surgical

## 2022-10-07 ENCOUNTER — Ambulatory Visit: Payer: BC Managed Care – PPO | Attending: Medical-Surgical

## 2022-10-07 VITALS — BP 127/86 | HR 90 | Resp 20 | Ht 65.0 in | Wt 218.0 lb

## 2022-10-07 DIAGNOSIS — I10 Essential (primary) hypertension: Secondary | ICD-10-CM

## 2022-10-07 DIAGNOSIS — R0609 Other forms of dyspnea: Secondary | ICD-10-CM

## 2022-10-07 DIAGNOSIS — R002 Palpitations: Secondary | ICD-10-CM

## 2022-10-07 DIAGNOSIS — E876 Hypokalemia: Secondary | ICD-10-CM | POA: Diagnosis not present

## 2022-10-07 LAB — POTASSIUM: Potassium: 3 mmol/L — ABNORMAL LOW (ref 3.5–5.3)

## 2022-10-07 MED ORDER — POTASSIUM CHLORIDE CRYS ER 20 MEQ PO TBCR: 40.0000 meq | EXTENDED_RELEASE_TABLET | Freq: Two times a day (BID) | ORAL | 0 refills | Status: DC

## 2022-10-07 NOTE — Telephone Encounter (Signed)
Patient scheduled.

## 2022-10-07 NOTE — Progress Notes (Signed)
Established Patient Office Visit  Subjective   Patient ID: Linda [name], female   DOB: 03/12/1981 Age: 41 y.o. MRN: [id number]   Chief Complaint  Patient presents with   Hypertension   HPI Pleasant 41 year old female presenting today for evaluation of hypertension, dyspnea on exertion, and palpitations.  Has been having significant issues with managing her blood pressure recently.  She was switched to amlodipine while in the hospital.  Has been taking the medication as prescribed, tolerating well overall.  Had previously been getting good readings under 130/80 but over the weekend, her blood pressures went significantly higher to 160/100 and stayed that way for a few days.  She has been experiencing a fluttering feeling in her chest off and on.  Notes this happens multiple times throughout the day and only last for a few seconds each time.  Describes it as feeling butterflies in her chest.  Has a history of an irregular heart rate but nothing requiring treatment.  Notes that she has been getting extremely short of breath with regular activities.  Went up a flight of stairs a few days ago and was extremely short of breath when she got to the next level.  Denies chest pain, shortness of breath at rest, orthopnea, lower extremity edema, dizziness, headaches, and syncopal episodes.  Has been taking potassium as prescribed.  Last potassium level was 2.9.  We increased her oral supplementation to 40 mill equivalents twice daily for 3 days then to 40 mill equivalents daily for 1 week then reduce to 20 mill equivalents daily starting today.  She had her potassium rechecked and the results came in this morning at 3.0.  Notes that she is still having intermittent severe hand cramps and leg cramps.  Of note, she has not had any further diarrhea over the last couple of weeks and she is not taking any medications that should cause or worsen hypokalemia.   Objective:    Vitals:   10/07/22 0932  BP: 127/86   Pulse: 90  Resp: 20  Height: 5\' 5"  (1.651 m)  Weight: 218 lb (98.9 kg)  SpO2: 98%  BMI (Calculated): 36.28    Physical Exam Vitals and nursing note reviewed.  Constitutional:      General: She is not in acute distress.    Appearance: Normal appearance. She is not ill-appearing.  HENT:     Head: Normocephalic and atraumatic.  Cardiovascular:     Rate and Rhythm: Normal rate and regular rhythm.     Pulses: Normal pulses.     Heart sounds: Normal heart sounds.  Pulmonary:     Effort: Pulmonary effort is normal. No respiratory distress.     Breath sounds: Normal breath sounds. No wheezing, rhonchi or rales.  Skin:    General: Skin is warm and dry.  Neurological:     Mental Status: She is alert and oriented to person, place, and time.  Psychiatric:        Mood and Affect: Mood normal.        Behavior: Behavior normal.        Thought Content: Thought content normal.        Judgment: Judgment normal.    Results for orders placed or performed in visit on 09/26/22 (from the past 24 hour(s))  Potassium     Status: Abnormal   Collection Time: 10/06/22  2:00 PM  Result Value Ref Range   Potassium 3.0 (L) 3.5 - 5.3 mmol/L      The ASCVD Risk  score (Arnett DK, et al., 2019) failed to calculate for the following reasons:   Cannot find a previous HDL lab   Cannot find a previous total cholesterol lab   Assessment & Plan:   1. Essential hypertension Blood pressure in office today 127/86 showing that amlodipine is doing well to control her blood pressure.  Unclear if her monitor is inaccurate or if she had a spike over the weekend.  Her monitor has never been checked against ours for accuracy.  Checking aldosterone today.  No evidence of palpitations on the in office EKG which showed a rate of 84, normal axis, normal sinus rhythm with possible T wave abnormality.  Suspect abnormality in the T wave could be related to hypokalemia.  Given the frequency of her palpitations, we will go  ahead and order a Zio patch for further evaluation. - Aldosterone + renin activity w/ ratio - LONG TERM MONITOR (3-14 DAYS); Future  2. Palpitations 3. DOE (dyspnea on exertion) Zio patch as described above.  Also ordering echocardiogram for further intervention. - ECHOCARDIOGRAM COMPLETE; Future - LONG TERM MONITOR (3-14 DAYS); Future  4. Hypokalemia Unfortunately, she has not responded extremely well to oral potassium.  We will go ahead and increase potassium chloride to 40 mill equivalents twice daily for the next 2 weeks then recheck potassium. - potassium chloride SA (KLOR-CON M) 20 MEQ tablet; Take 2 tablets (40 mEq total) by mouth 2 (two) times daily for 14 days.  Dispense: 56 tablet; Refill: 0  Return in about 2 weeks (around 10/21/2022) for potassium recheck (lab order in).  ___________________________________________ Thayer Ohm, DNP, APRN, FNP-BC Primary Care and Sports Medicine [facility] [place]

## 2022-10-07 NOTE — Progress Notes (Unsigned)
Enrolled patient for a 7 day Zio XT monitor to be mailed to patients home   DOD to read 

## 2022-10-09 DIAGNOSIS — I10 Essential (primary) hypertension: Secondary | ICD-10-CM | POA: Diagnosis not present

## 2022-10-09 DIAGNOSIS — R002 Palpitations: Secondary | ICD-10-CM

## 2022-10-09 DIAGNOSIS — R0609 Other forms of dyspnea: Secondary | ICD-10-CM | POA: Diagnosis not present

## 2022-10-09 NOTE — Addendum Note (Signed)
Addended by: Chalmers Cater on: 10/09/2022 07:51 AM   Modules accepted: Orders

## 2022-10-11 LAB — ALDOSTERONE + RENIN ACTIVITY W/ RATIO
ALDO / PRA Ratio: 0.6 Ratio — ABNORMAL LOW (ref 0.9–28.9)
Aldosterone: 9 ng/dL
Renin Activity: 13.95 ng/mL/h — ABNORMAL HIGH (ref 0.25–5.82)

## 2022-10-15 ENCOUNTER — Encounter: Payer: Self-pay | Admitting: Medical-Surgical

## 2022-10-15 DIAGNOSIS — I493 Ventricular premature depolarization: Secondary | ICD-10-CM

## 2022-10-21 ENCOUNTER — Encounter: Payer: BC Managed Care – PPO | Admitting: Medical-Surgical

## 2022-10-21 NOTE — Progress Notes (Unsigned)
   Established Patient Office Visit  Subjective   Patient ID: Linda [name], female   DOB: 10-06-1981 Age: 42 y.o. MRN: [id number]   No chief complaint on file.   HPI    Objective:    There were no vitals filed for this visit.  Physical Exam   No results found for this or any previous visit (from the past 24 hour(s)).   {Labs (Optional):23779}  The ASCVD Risk score (Arnett DK, et al., 2019) failed to calculate for the following reasons:   Cannot find a previous HDL lab   Cannot find a previous total cholesterol lab   Assessment & Plan:   No problem-specific Assessment & Plan notes found for this encounter.   No follow-ups on file.  ___________________________________________ Thayer Ohm, DNP, APRN, FNP-BC Primary Care and Sports Medicine [facility] [place]

## 2022-11-06 ENCOUNTER — Ambulatory Visit (INDEPENDENT_AMBULATORY_CARE_PROVIDER_SITE_OTHER): Payer: BC Managed Care – PPO | Admitting: Medical-Surgical

## 2022-11-06 DIAGNOSIS — Z91199 Patient's noncompliance with other medical treatment and regimen due to unspecified reason: Secondary | ICD-10-CM

## 2022-11-19 ENCOUNTER — Encounter: Payer: Self-pay | Admitting: Cardiovascular Disease

## 2022-11-19 ENCOUNTER — Ambulatory Visit: Payer: BC Managed Care – PPO | Attending: Cardiovascular Disease | Admitting: Cardiovascular Disease

## 2022-11-19 NOTE — Progress Notes (Signed)
This encounter was created in error - please disregard.

## 2022-11-20 ENCOUNTER — Ambulatory Visit
Admission: RE | Admit: 2022-11-20 | Discharge: 2022-11-20 | Disposition: A | Discharge status: Home / Self Care | Payer: BC Managed Care – PPO | Source: Ambulatory Visit

## 2022-11-20 ENCOUNTER — Other Ambulatory Visit: Payer: Self-pay | Admitting: Medical-Surgical

## 2022-11-20 VITALS — BP 145/84 | HR 83 | Temp 99.0°F | Resp 18 | Ht 65.5 in | Wt 194.0 lb

## 2022-11-20 DIAGNOSIS — R197 Diarrhea, unspecified: Secondary | ICD-10-CM

## 2022-11-20 MED ORDER — DIPHENOXYLATE-ATROPINE 2.5-0.025 MG PO TABS: 1.0000 | ORAL_TABLET | Freq: Four times a day (QID) | ORAL | 0 refills | Status: DC | PRN

## 2022-11-20 NOTE — Telephone Encounter (Signed)
High dose vitamin D initially for level of 15. Due for recheck of Vitamin D. Recommend switching to OTC vitamin D 2000 units daily.

## 2022-11-20 NOTE — ED Notes (Signed)
Pt to collect stool samples at home and bring back today before 4:00 pm. All supplies given.

## 2022-11-20 NOTE — ED Provider Notes (Signed)
[facility]    CSN: [id number] Arrival date & time: 11/20/22  2536      History   Chief Complaint Chief Complaint  Patient presents with   Diarrhea   Abdominal Pain   Nausea    HPI Linda [name] is a 42 y.o. female.   42yo female presents today due to generalized abdominal cramping and diarrhea.  Patient states symptoms started around 3 AM on Wednesday morning.  Patient states for the past 32 hours she has had at least 20 explosive bowel movements.  She states anytime she eats or drinks anything, she has to run to the bathroom and nearly does not make it.  She has taken a total of 4 Imodium tablets with no resolution.  She reports generalized abdominal cramping, but no abdominal pain.  No blood in the stool.  Mild nausea but no vomiting.  No known fever.  No URI symptoms, no body aches.  She states everyone in her family has eaten the same foods, patient is the only one with symptoms.  No recent travel or well water use. Pt does have noted hx of IBS in her chart.   Diarrhea Associated symptoms: abdominal pain   Abdominal Pain Associated symptoms: diarrhea     Past Medical History:  Diagnosis Date   Anxiety    Depression    DVT (deep venous thrombosis) (HCC)    Hypertension    IBS (irritable bowel syndrome)    Interstitial cystitis    Migraine    Pap smear abnormality of cervix/human papillomavirus (HPV) positive    Seizures (HCC)    Stroke [facility])     Patient Active Problem List   Diagnosis Date Noted   History of migraine 08/29/2022   Obesity, Class II, BMI 35-39.9 06/24/2022   Burning sensation of skin 03/24/2022   Pruritus of skin 03/24/2022   Axillary lymphadenopathy 03/24/2022   Perirectal abscess 03/07/2022   Daily consumption of alcohol 02/19/2022   Hypokalemia 02/19/2022   Rectal mass 02/04/2022   External hemorrhoid 01/20/2022   Interstitial cystitis 01/13/2022   Shingles outbreak 01/13/2022   Fracture of calcaneus, left, closed 12/19/2019    Migraine with aura and without status migrainosus, not intractable 12/06/2019   History of stroke 12/06/2019   History of DVT of lower extremity 12/06/2019   Chest pain 12/06/2019   Essential hypertension 12/06/2019   GAD (generalized anxiety disorder) 12/06/2019   BMI 34.0-34.9,adult 12/06/2019   Menorrhagia with irregular cycle 08/05/2019   Pap smear of cervix shows high risk HPV present 09/09/2016   S/P tubal ligation 09/02/2016   Vitamin D deficiency 08/04/2016   S/P cesarean section 07/27/2016   Seizure (HCC) 01/02/2016   Family history of genetic disease 12/28/2015   ADD (attention deficit disorder) 05/01/2015   Anxiety 05/01/2015   Fibromyalgia 09/27/2012   IBS (irritable bowel syndrome) 01/01/2011   Palpitations 03/07/2010    Past Surgical History:  Procedure Laterality Date   CARPAL TUNNEL RELEASE     CESAREAN SECTION     INTERSTIM IMPLANT PLACEMENT     tenze     TUBAL LIGATION      OB History     Gravida  4   Para  2   Term      Preterm      AB  2   Living  2      SAB  2   IAB      Ectopic      Multiple  Live Births               Home Medications    Prior to Admission medications   Medication Sig Start Date End Date Taking? Authorizing Provider  diphenoxylate-atropine (LOMOTIL) 2.5-0.025 MG tablet Take 1 tablet by mouth 4 (four) times daily as needed for diarrhea or loose stools. Do NOT exceed 8 tabs in 24 hours 11/20/22  Yes Leavy Heatherly L, PA  pantoprazole (PROTONIX) 40 MG tablet Take 1 tablet (40 mg total) by mouth daily. 09/16/22   Luetta Nutting, DO  amLODipine (NORVASC) 5 MG tablet Take 5 mg by mouth daily. 08/29/22   [provider]  clonazePAM (KLONOPIN) 0.5 MG tablet Take 1 tablet (0.5 mg total) by mouth daily as needed for anxiety. USE VERY SPARINGLY 02/13/22   Samuel Bouche, NP  meloxicam (MOBIC) 15 MG tablet ONE TABLET EVERY MORNING WITH A MEAL FOR 2 WEEKS, THEN DAILY AS NEEDED FOR PAIN. 09/15/22   Samuel Bouche, NP   ondansetron (ZOFRAN-ODT) 8 MG disintegrating tablet Take 1 tablet (8 mg total) by mouth every 8 (eight) hours as needed for nausea. 06/24/22   Samuel Bouche, NP  potassium chloride SA (KLOR-CON M) 20 MEQ tablet Take 2 tablets (40 mEq total) by mouth 2 (two) times daily for 14 days. 10/07/22 10/21/22  Samuel Bouche, NP  promethazine (PHENERGAN) 25 MG tablet Take 1 tablet (25 mg total) by mouth every 6 (six) hours as needed for nausea or vomiting. 04/02/22   Raylene Everts, MD  Semaglutide-Weight Management [facility]) 1 MG/0.5ML SOAJ Inject 1 mg into the skin once a week. 09/16/22   Samuel Bouche, NP  sertraline (ZOLOFT) 25 MG tablet Take 1 tablet (25 mg total) by mouth daily. 03/24/22   Samuel Bouche, NP  topiramate (TOPAMAX) 200 MG tablet TAKE 1 TABLET BY MOUTH EVERY DAY 07/01/22   Samuel Bouche, NP  Vitamin D, Ergocalciferol, (DRISDOL) 1.25 MG (50000 UNIT) CAPS capsule Take 1 capsule (50,000 Units total) by mouth every 7 (seven) days. Take for 12 total doses(weeks) than can transition to 1000 units OTC supplement daily 09/03/22   Samuel Bouche, NP    Family History Family History  Problem Relation Age of Onset   Hypertension Mother    Kidney failure Mother    Hypertension Father    Clotting disorder Father    Diabetes Father    Deep vein thrombosis Father    Stroke Maternal Grandmother    Prostate cancer Maternal Grandfather    Stroke Paternal Grandfather    Colon cancer Paternal Grandfather    Breast cancer Maternal Aunt    Factor V Leiden deficiency Maternal Aunt    Deep vein thrombosis Paternal Aunt    Breast cancer Maternal Great-grandmother     Social History Social History   Tobacco Use   Smoking status: Former   Smokeless tobacco: Never  Scientific laboratory technician Use: Never used  Substance Use Topics   Alcohol use: Not Currently    Comment: occasionally   Drug use: Never     Allergies   Chocolate, Cocoa butter, Iodinated contrast media, Propoxyphene, Sulfa antibiotics, Lisinopril,  Oxycodone, and Codeine   Review of Systems Review of Systems  Gastrointestinal:  Positive for abdominal pain and diarrhea.  As per HPI   Physical Exam Triage Vital Signs ED Triage Vitals  Enc Vitals Group     BP 11/20/22 0957 (!) 145/84     Pulse Rate 11/20/22 0957 83     Resp 11/20/22 0957 18  Temp 11/20/22 0957 99 F (37.2 C)     Temp Source 11/20/22 0957 Oral     SpO2 11/20/22 0957 97 %     Weight 11/20/22 0953 194 lb (88 kg)     Height 11/20/22 0953 5' 5.5" (1.664 m)     Head Circumference --      Peak Flow --      Pain Score 11/20/22 0952 7     Pain Loc --      Pain Edu? --      Excl. in [place]? --    No data found.  Updated Vital Signs BP (!) 145/84 (BP Location: Right Arm)   Pulse 83   Temp 99 F (37.2 C) (Oral)   Resp 18   Ht 5' 5.5" (1.664 m)   Wt 194 lb (88 kg)   LMP 08/20/2022 (Approximate) Comment: tubal ligation  SpO2 97%   BMI 31.79 kg/m   Visual Acuity Right Eye Distance:   Left Eye Distance:   Bilateral Distance:    Right Eye Near:   Left Eye Near:    Bilateral Near:     Physical Exam Vitals and nursing note reviewed.  Constitutional:      General: She is not in acute distress.    Appearance: She is well-developed. She is obese. She is not ill-appearing, toxic-appearing or diaphoretic.  HENT:     Head: Normocephalic and atraumatic.     Mouth/Throat:     Mouth: Mucous membranes are moist.     Pharynx: Oropharynx is clear. No pharyngeal swelling or oropharyngeal exudate.  Eyes:     General: No scleral icterus.    Extraocular Movements: Extraocular movements intact.     Pupils: Pupils are equal, round, and reactive to light.  Cardiovascular:     Rate and Rhythm: Normal rate.  Pulmonary:     Effort: Pulmonary effort is normal. No respiratory distress.     Breath sounds: Normal breath sounds. No stridor. No wheezing or rhonchi.  Abdominal:     General: Abdomen is flat. Bowel sounds are normal. There is no distension.     Palpations:  Abdomen is soft. There is no hepatomegaly or splenomegaly.     Tenderness: There is generalized abdominal tenderness. There is no right CVA tenderness, left CVA tenderness, guarding or rebound. Negative signs include Murphy's sign and McBurney's sign.     Hernia: No hernia is present.     Comments: Tympanic throughout, mild dullness to suprapubic region.  Skin:    General: Skin is warm and dry.     Capillary Refill: Capillary refill takes less than 2 seconds.     Coloration: Skin is not pale.     Findings: No erythema or rash.  Neurological:     General: No focal deficit present.     Mental Status: She is alert and oriented to person, place, and time.     Cranial Nerves: No cranial nerve deficit.     Motor: No weakness.  Psychiatric:        Mood and Affect: Mood normal.        Behavior: Behavior normal.      UC Treatments / Results  Labs (all labs ordered are listed, but only abnormal results are displayed) Labs Reviewed  GASTROINTESTINAL PANEL BY PCR, STOOL (REPLACES STOOL CULTURE)  CLOSTRIDIUM DIFFICILE BY PCR    EKG   Radiology No results found.  Procedures Procedures (including critical care time)  Medications Ordered in UC Medications - No data  to display  Initial Impression / Assessment and Plan / UC Course  I have reviewed the triage vital signs and the nursing notes.  Pertinent labs & imaging results that were available during my care of the patient were reviewed by me and considered in my medical decision making (see chart for details).     Diarrhea - x 32 hours. Sounds viral in nature. Pt denies sx consistent with possible covid or influenza. No physical exam findings to warrant imaging of the abdomen. No signs of peritonitis or guarding. Pt requesting to take the GI PCR kit home and will return today. Pt is not tachycardiac and appears hydrated, however does have hx of electrolyte abnormalities in the past therefore would like to control the diarrheal  episodes with lomotil in place of the imodium. ER precautions reviewed with pt.    Final Clinical Impressions(s) / UC Diagnoses   Final diagnoses:  Diarrhea, unspecified type     Discharge Instructions      Your diarrhea sounds most likely viral in nature. Please perform the GI culture kit as soon as possible and bring back before 4 PM today. Stop taking Imodium, switch to the Lomotil prescribed today. You can take 1 to 2 tablets by mouth 4 times a day as needed for loose stools. Do not exceed 8 tablets in 24 hours.  Try to stay hydrated by drinking plenty of water, Gatorade, Pedialyte. Please read the attached handout of food choices to help relieve diarrhea  If you develop an extremely dry mouth, feel lightheaded, or feel your heart racing, please head to the emergency room.    ED Prescriptions     Medication Sig Dispense Auth. Provider   diphenoxylate-atropine (LOMOTIL) 2.5-0.025 MG tablet Take 1 tablet by mouth 4 (four) times daily as needed for diarrhea or loose stools. Do NOT exceed 8 tabs in 24 hours 30 tablet Jin Shockley L, PA      I have reviewed the PDMP during this encounter.   Chaney Malling, [place] 11/20/22 1114

## 2022-11-20 NOTE — ED Triage Notes (Signed)
Pt presents to Urgent Care with c/o diarrhea, nausea, and abdominal pain/cramping since early yesterday. Reports that "food goes straight through me." No emesis, no known fever, and no food concerns.

## 2022-11-20 NOTE — Discharge Instructions (Signed)
Your diarrhea sounds most likely viral in nature. Please perform the GI culture kit as soon as possible and bring back before 4 PM today. Stop taking Imodium, switch to the Lomotil prescribed today. You can take 1 to 2 tablets by mouth 4 times a day as needed for loose stools. Do not exceed 8 tablets in 24 hours.  Try to stay hydrated by drinking plenty of water, Gatorade, Pedialyte. Please read the attached handout of food choices to help relieve diarrhea  If you develop an extremely dry mouth, feel lightheaded, or feel your heart racing, please head to the emergency room.

## 2022-11-24 ENCOUNTER — Ambulatory Visit (HOSPITAL_COMMUNITY): Payer: BC Managed Care – PPO | Attending: Cardiology

## 2022-11-24 DIAGNOSIS — R002 Palpitations: Secondary | ICD-10-CM | POA: Diagnosis not present

## 2022-11-24 DIAGNOSIS — R0609 Other forms of dyspnea: Secondary | ICD-10-CM | POA: Diagnosis not present

## 2022-11-24 LAB — ECHOCARDIOGRAM COMPLETE
Area-P 1/2: 5.01 cm2
S' Lateral: 2.9 cm

## 2022-12-12 ENCOUNTER — Ambulatory Visit: Payer: BC Managed Care – PPO | Attending: Cardiovascular Disease | Admitting: Cardiovascular Disease

## 2022-12-12 ENCOUNTER — Encounter: Payer: Self-pay | Admitting: Cardiovascular Disease

## 2022-12-12 ENCOUNTER — Other Ambulatory Visit: Payer: Self-pay | Admitting: Medical-Surgical

## 2022-12-12 VITALS — BP 128/78 | HR 88 | Ht 65.5 in | Wt 195.4 lb

## 2022-12-12 DIAGNOSIS — I493 Ventricular premature depolarization: Secondary | ICD-10-CM

## 2022-12-12 DIAGNOSIS — I10 Essential (primary) hypertension: Secondary | ICD-10-CM | POA: Diagnosis not present

## 2022-12-12 DIAGNOSIS — E876 Hypokalemia: Secondary | ICD-10-CM

## 2022-12-12 LAB — BASIC METABOLIC PANEL
BUN/Creatinine Ratio: 7 — ABNORMAL LOW (ref 9–23)
BUN: 6 mg/dL (ref 6–24)
CO2: 23 mmol/L (ref 20–29)
Calcium: 9.7 mg/dL (ref 8.7–10.2)
Chloride: 102 mmol/L (ref 96–106)
Creatinine, Ser: 0.9 mg/dL (ref 0.57–1.00)
Glucose: 79 mg/dL (ref 70–99)
Potassium: 4.1 mmol/L (ref 3.5–5.2)
Sodium: 141 mmol/L (ref 134–144)
eGFR: 83 mL/min/{1.73_m2} (ref >59)

## 2022-12-12 MED ORDER — METOPROLOL TARTRATE 25 MG PO TABS: 25.0000 mg | ORAL_TABLET | Freq: Two times a day (BID) | ORAL | 3 refills | Status: DC

## 2022-12-12 NOTE — Patient Instructions (Addendum)
Medication Instructions:  START Metoprolol Tartrate '25mg'$  twice daily *If you need a refill on your cardiac medications before your next appointment, please call your pharmacy*   Lab Work: BMET today If you have labs (blood work) drawn today and your tests are completely normal, you will receive your results only by: [place] (if you have MyChart) OR A paper copy in the mail If you have any lab test that is abnormal or we need to change your treatment, we will call you to review the results.   Testing/Procedures: NONE   Follow-Up: At [facility], you and your health needs are our priority.  As part of our continuing mission to provide you with exceptional heart care, we have created designated Provider Care Teams.  These Care Teams include your primary Cardiologist (physician) and Advanced Practice Providers (APPs -  Physician Assistants and Nurse Practitioners) who all work together to provide you with the care you need, when you need it.  Your next appointment:   3 month(s)  Provider:   Mertie Moores, MD

## 2022-12-12 NOTE — Progress Notes (Signed)
Cardiology Office Note:    Date:  12/12/2022   ID:  Linda [name], DOB 02-01-1981, MRN [id number]  PCP:  Samuel Bouche, NP   [place] Providers Cardiologist:  Amra Shukla  Click to update primary MD,subspecialty MD or APP then REFRESH:1}    Referring MD: Samuel Bouche, NP   Chief Complaint  Patient presents with   Palpitations   Hypertension          History of Present Illness:    Linda [name] is a 42 y.o. female with a hx of HTN, DVT  Has had palpitations for a year  Throughout the day , Some days are worse than other  Not dependent on her diet,  Does not drink much caffeine   Sometimes described like heart is racing   Can occur with rest or exertion  Might last for few seconds or perhaps 5-10 minutes  Just waits it out  Associated with chest tightness   Clinically sounds like PVCs and also episodes of SVT    She is worn an event monitor which revealed sinus rhythm.  She had frequent/occasional premature ventricular contractions.  Several of her triggers correlated to these PVCs.  Echocardiogram from November 24, 2022 reveals normal left ventricular systolic function with an EF of 58%.  She has grade 1 diastolic dysfunction.  RV appears to be mildly enlarged.  Mild mitral regurgitation.  We discussed Jardiance . She is prone to yeast infections so we will hold off of this for now   Has lost 30 lbs on Wegovy   Works in Writer ( Sales executive, gutters , shower doors)  + fam hx of HTN   She appears to be having symptomatic PVCs.  Will add metoprolol 25 mg twice a day.  For now we will continue the current dose of amlodipine but have instructed her to hold her her amlodipine if she starts having episodes of orthostatic hypotension.  She will work on getting some regular aerobic exercise on her days off.  She has traditionally had a low potassium level.  Will recheck her potassium levels today.  She may respond well to spironolactone instead of  amlodipine.  Will explore this further when I see her again in 3 months.    Past Medical History:  Diagnosis Date   Anxiety    Depression    DVT (deep venous thrombosis) (HCC)    Hypertension    IBS (irritable bowel syndrome)    Interstitial cystitis    Migraine    Pap smear abnormality of cervix/human papillomavirus (HPV) positive    Seizures (HCC)    Stroke (HCC)     Past Surgical History:  Procedure Laterality Date   CARPAL TUNNEL RELEASE     CESAREAN SECTION     INTERSTIM IMPLANT PLACEMENT     tenze     TUBAL LIGATION      Current Medications: Current Meds  Medication Sig   amLODipine (NORVASC) 5 MG tablet Take 5 mg by mouth daily.   clonazePAM (KLONOPIN) 0.5 MG tablet Take 1 tablet (0.5 mg total) by mouth daily as needed for anxiety. USE VERY SPARINGLY   metoprolol tartrate (LOPRESSOR) 25 MG tablet Take 1 tablet (25 mg total) by mouth 2 (two) times daily.   ondansetron (ZOFRAN-ODT) 8 MG disintegrating tablet Take 1 tablet (8 mg total) by mouth every 8 (eight) hours as needed for nausea.   pantoprazole (PROTONIX) 40 MG tablet Take 1 tablet (40 mg total) by mouth daily.   promethazine (PHENERGAN)  25 MG tablet Take 1 tablet (25 mg total) by mouth every 6 (six) hours as needed for nausea or vomiting.   Semaglutide-Weight Management (WEGOVY) 1 MG/0.5ML SOAJ Inject 1 mg into the skin once a week.   sertraline (ZOLOFT) 25 MG tablet Take 1 tablet (25 mg total) by mouth daily.   topiramate (TOPAMAX) 200 MG tablet TAKE 1 TABLET BY MOUTH EVERY DAY   Vitamin D, Ergocalciferol, (DRISDOL) 1.25 MG (50000 UNIT) CAPS capsule Take 1 capsule (50,000 Units total) by mouth every 7 (seven) days. Take for 12 total doses(weeks) than can transition to 1000 units OTC supplement daily     Allergies:   Chocolate, Chocolate flavor, Cocoa butter, Iodinated contrast media, Propoxyphene, Sulfa antibiotics, Lisinopril, Oxycodone, and Codeine   Social History   Socioeconomic History   Marital  status: Single    Spouse name: Not on file   Number of children: Not on file   Years of education: Not on file   Highest education level: Not on file  Occupational History   Not on file  Tobacco Use   Smoking status: Former   Smokeless tobacco: Never  Vaping Use   Vaping Use: Never used  Substance and Sexual Activity   Alcohol use: Not Currently    Comment: occasionally   Drug use: Never   Sexual activity: Not on file  Other Topics Concern   Not on file  Social History Narrative   Not on file   Social Determinants of Health   Financial Resource Strain: Not on file  Food Insecurity: Not on file  Transportation Needs: Not on file  Physical Activity: Not on file  Stress: Not on file  Social Connections: Not on file     Family History: The patient's family history includes Breast cancer in her maternal aunt and maternal great-grandmother; Clotting disorder in her father; Colon cancer in her paternal grandfather; Deep vein thrombosis in her father and paternal aunt; Diabetes in her father; Factor V Leiden deficiency in her maternal aunt; Hypertension in her father and mother; Kidney failure in her mother; Prostate cancer in her maternal grandfather; Stroke in her maternal grandmother and paternal grandfather.  ROS:   Please see the history of present illness.     All other systems reviewed and are negative.  EKGs/Labs/Other Studies Reviewed:    The following studies were reviewed today:   EKG:  October 07, 2022: Normal sinus rhythm at 84.  Nonspecific T wave abnormality.  Recent Labs: 01/12/2022: ALT 28; Hemoglobin 15.3; Platelets 335 09/03/2022: BUN 10; Creat 0.88; Sodium 138 10/06/2022: Potassium 3.0  Recent Lipid Panel No results found for: "CHOL", "TRIG", "HDL", "CHOLHDL", "VLDL", "LDLCALC", "LDLDIRECT"   Risk Assessment/Calculations:                Physical Exam:    VS:  BP 128/78   Pulse 88   Ht 5' 5.5" (1.664 m)   Wt 195 lb 6.4 oz (88.6 kg)   LMP  08/20/2022 (Approximate) Comment: tubal ligation  SpO2 98%   BMI 32.02 kg/m     Wt Readings from Last 3 Encounters:  12/12/22 195 lb 6.4 oz (88.6 kg)  11/20/22 194 lb (88 kg)  10/07/22 218 lb (98.9 kg)     GEN:  young, mildly obese female ,  in no acute distress HEENT: Normal NECK: No JVD; No carotid bruits LYMPHATICS: No lymphadenopathy CARDIAC: RRR, nvery soft murmur at LSB ( insignificant ) , rubs, gallops RESPIRATORY:  Clear to auscultation without rales, wheezing  or rhonchi  ABDOMEN: Soft, non-tender, non-distended MUSCULOSKELETAL:  No edema; No deformity  SKIN: Warm and dry NEUROLOGIC:  Alert and oriented x 3 PSYCHIATRIC:  Normal affect   ASSESSMENT:    1. Primary hypertension   2. PVC (premature ventricular contraction)    PLAN:       PVCs:   She appears to be having symptomatic PVCs.  Will add metoprolol 25 mg twice a day.  For now we will continue the current dose of amlodipine but have instructed her to hold her her amlodipine if she starts having episodes of orthostatic hypotension.  She will work on getting some regular aerobic exercise on her days off.   2.  HTN:   fairly well controlled on amlodipine .  She has traditionally had a low potassium level.  Will recheck her potassium levels today.  She may respond well to spironolactone instead of amlodipine.  Will explore this further when I see her again in 3 months.   3.  Obesity: She is lost 30 pounds on Wegovy.  She will follow-up with Samuel Bouche, NP for further advice regarding her continuation of this medication.          Medication Adjustments/Labs and Tests Ordered: Current medicines are reviewed at length with the patient today.  Concerns regarding medicines are outlined above.  Orders Placed This Encounter  Procedures   Basic metabolic panel   Meds ordered this encounter  Medications   metoprolol tartrate (LOPRESSOR) 25 MG tablet    Sig: Take 1 tablet (25 mg total) by mouth 2 (two) times  daily.    Dispense:  180 tablet    Refill:  3    Patient Instructions  Medication Instructions:  START Metoprolol Tartrate '25mg'$  twice daily *If you need a refill on your cardiac medications before your next appointment, please call your pharmacy*   Lab Work: BMET today If you have labs (blood work) drawn today and your tests are completely normal, you will receive your results only by: [place] (if you have MyChart) OR A paper copy in the mail If you have any lab test that is abnormal or we need to change your treatment, we will call you to review the results.   Testing/Procedures: NONE   Follow-Up: At [facility], you and your health needs are our priority.  As part of our continuing mission to provide you with exceptional heart care, we have created designated Provider Care Teams.  These Care Teams include your primary Cardiologist (physician) and Advanced Practice Providers (APPs -  Physician Assistants and Nurse Practitioners) who all work together to provide you with the care you need, when you need it.  Your next appointment:   3 month(s)  Provider:   Mertie Moores, MD     Signed, Mertie Moores, MD  12/12/2022 10:46 AM    [place]

## 2022-12-13 ENCOUNTER — Other Ambulatory Visit: Payer: Self-pay | Admitting: Family Medicine

## 2022-12-16 ENCOUNTER — Ambulatory Visit: Payer: BC Managed Care – PPO | Admitting: Medical-Surgical

## 2022-12-16 ENCOUNTER — Encounter: Payer: Self-pay | Admitting: Medical-Surgical

## 2022-12-16 VITALS — BP 136/88 | HR 70 | Resp 20 | Ht 65.5 in | Wt 196.5 lb

## 2022-12-16 DIAGNOSIS — E661 Drug-induced obesity: Secondary | ICD-10-CM

## 2022-12-16 DIAGNOSIS — Z6832 Body mass index (BMI) 32.0-32.9, adult: Secondary | ICD-10-CM | POA: Diagnosis not present

## 2022-12-16 MED ORDER — WEGOVY 2.4 MG/0.75ML ~~LOC~~ SOAJ: 2.4000 mg | SUBCUTANEOUS | 3 refills | Status: DC

## 2022-12-16 NOTE — Progress Notes (Signed)
   Established Patient Office Visit  Subjective   Patient ID: Linda [name], female   DOB: Oct 20, 1981 Age: 42 y.o. MRN: [id number]   Chief Complaint  Patient presents with   Follow-up    MEDICATION   HPI Pleasant 41 year old female presenting today for follow up on weight loss. She has been taking Wegovy 2.76m weekly, tolerating well without side effects. Has been exercising regularly.  Has also made dietary modifications to choose healthier options, smaller portions, and more balanced intake.  Has successfully lost approximately 30 pounds and is happy with her results.  Notes that she feels better overall and her clothes are fitting differently.  Would like to continue the medication to help with further weight loss.  Her goal weight is approximately 140 pounds.   Objective:    Vitals:   12/16/22 0935 12/16/22 1009  BP: (!) 133/90 136/88  Pulse: 74 70  Resp: 20 20  Height: 5' 5.5" (1.664 m)   Weight: 196 lb 8 oz (89.1 kg)   SpO2: 96% 98%  BMI (Calculated): 32.19    Physical Exam Vitals reviewed.  Constitutional:      General: She is not in acute distress.    Appearance: Normal appearance. She is not ill-appearing.  HENT:     Head: Normocephalic and atraumatic.  Cardiovascular:     Rate and Rhythm: Normal rate and regular rhythm.     Pulses: Normal pulses.     Heart sounds: Normal heart sounds.  Pulmonary:     Effort: Pulmonary effort is normal. No respiratory distress.     Breath sounds: Normal breath sounds. No wheezing, rhonchi or rales.  Skin:    General: Skin is warm and dry.  Neurological:     Mental Status: She is alert and oriented to person, place, and time.  Psychiatric:        Mood and Affect: Mood normal.        Behavior: Behavior normal.        Thought Content: Thought content normal.        Judgment: Judgment normal.   No results found for this or any previous visit (from the past 24 hour(s)).     The ASCVD Risk score (Arnett DK, et al., 2019) failed to  calculate for the following reasons:   Cannot find a previous HDL lab   Cannot find a previous total cholesterol lab   Assessment & Plan:   1. Class 1 drug-induced obesity with serious comorbidity and body mass index (BMI) of 32.0 to 32.9 in adult Successful loss of 30 pounds.  Continue Wegovy 2.4 mg weekly.  Continue dietary modifications and regular intentional exercise.  The goal weight of 140 pounds would put her at a BMI of 22.6 which is appropriate and considered a healthy weight.  Return in about 6 months (around 06/16/2023) for weight check.  ___________________________________________ JClearnce Sorrel DNP, APRN, FNP-BC [facility] [place]

## 2022-12-19 ENCOUNTER — Ambulatory Visit: Payer: BC Managed Care – PPO | Admitting: Medical-Surgical

## 2023-01-09 ENCOUNTER — Other Ambulatory Visit: Payer: Self-pay | Admitting: Medical-Surgical

## 2023-01-09 DIAGNOSIS — E876 Hypokalemia: Secondary | ICD-10-CM

## 2023-01-14 ENCOUNTER — Telehealth: Payer: BC Managed Care – PPO | Admitting: Physician Assistant

## 2023-01-14 DIAGNOSIS — B3731 Acute candidiasis of vulva and vagina: Secondary | ICD-10-CM

## 2023-01-14 MED ORDER — FLUCONAZOLE 150 MG PO TABS: 150.0000 mg | ORAL_TABLET | Freq: Once | ORAL | 0 refills | Status: AC

## 2023-01-14 NOTE — Progress Notes (Signed)
I have spent 5 minutes in review of e-visit questionnaire, review and updating patient chart, medical decision making and response to patient.   Athira Janowicz Cody Maryclare Nydam, PA-C    

## 2023-01-14 NOTE — Progress Notes (Signed)

## 2023-02-25 ENCOUNTER — Telehealth: Payer: BC Managed Care – PPO | Admitting: Nurse Practitioner

## 2023-02-25 DIAGNOSIS — T3695XA Adverse effect of unspecified systemic antibiotic, initial encounter: Secondary | ICD-10-CM | POA: Diagnosis not present

## 2023-02-25 DIAGNOSIS — B379 Candidiasis, unspecified: Secondary | ICD-10-CM | POA: Diagnosis not present

## 2023-02-25 DIAGNOSIS — N3000 Acute cystitis without hematuria: Secondary | ICD-10-CM | POA: Diagnosis not present

## 2023-02-25 MED ORDER — FLUCONAZOLE 150 MG PO TABS
150.0000 mg | ORAL_TABLET | Freq: Once | ORAL | 0 refills | Status: AC
Start: 2023-02-25 — End: 2023-02-25

## 2023-02-25 MED ORDER — CEPHALEXIN 500 MG PO CAPS: 500.0000 mg | ORAL_CAPSULE | Freq: Two times a day (BID) | ORAL | 0 refills | Status: AC

## 2023-02-25 NOTE — Progress Notes (Signed)
No show for Video Visit. Also submitted E visit

## 2023-02-25 NOTE — Progress Notes (Signed)
E-Visit for Urinary Problems  We are sorry that you are not feeling well.  Here is how we plan to help!  Based on what you shared with me it looks like you most likely have a simple urinary tract infection.  A UTI (Urinary Tract Infection) is a bacterial infection of the bladder.  Most cases of urinary tract infections are simple to treat but a key part of your care is to encourage you to drink plenty of fluids and watch your symptoms carefully.  I have prescribed Keflex 500 mg twice a day for 7 days.  Your symptoms should gradually improve. Call us if the burning in your urine worsens, you develop worsening fever, back pain or pelvic pain or if your symptoms do not resolve after completing the antibiotic.  We will also prescribe a single Diflucan pill to use as needed if you experience yeast symptoms    Urinary tract infections can be prevented by drinking plenty of water to keep your body hydrated.  Also be sure when you wipe, wipe from front to back and don't hold it in!  If possible, empty your bladder every 4 hours.  HOME CARE Drink plenty of fluids Compete the full course of the antibiotics even if the symptoms resolve Remember, when you need to go.go. Holding in your urine can increase the likelihood of getting a UTI! GET HELP RIGHT AWAY IF: You cannot urinate You get a high fever Worsening back pain occurs You see blood in your urine You feel sick to your stomach or throw up You feel like you are going to pass out  MAKE SURE YOU  Understand these instructions. Will watch your condition. Will get help right away if you are not doing well or get worse.   Thank you for choosing an e-visit.  Your e-visit answers were reviewed by a board certified advanced clinical practitioner to complete your personal care plan. Depending upon the condition, your plan could have included both over the counter or prescription medications.  Please review your pharmacy choice. Make sure the  pharmacy is open so you can pick up prescription now. If there is a problem, you may contact your provider through [employer] and have the prescription routed to another pharmacy.  Your safety is important to [place]. If you have drug allergies check your prescription carefully.   For the next 24 hours you can use MyChart to ask questions about today's visit, request a non-urgent call back, or ask for a work or school excuse. You will get an email in the next two days asking about your experience. I hope that your e-visit has been valuable and will speed your recovery.   Meds ordered this encounter  Medications   cephALEXin (KEFLEX) 500 MG capsule    Sig: Take 1 capsule (500 mg total) by mouth 2 (two) times daily for 7 days.    Dispense:  14 capsule    Refill:  0   fluconazole (DIFLUCAN) 150 MG tablet    Sig: Take 1 tablet (150 mg total) by mouth once for 1 dose.    Dispense:  1 tablet    Refill:  0     I spent approximately 5 minutes reviewing the patient's history, current symptoms and coordinating their care today.

## 2023-03-04 ENCOUNTER — Other Ambulatory Visit: Payer: Self-pay | Admitting: Medical-Surgical

## 2023-03-04 DIAGNOSIS — G43109 Migraine with aura, not intractable, without status migrainosus: Secondary | ICD-10-CM

## 2023-03-14 ENCOUNTER — Other Ambulatory Visit: Payer: Self-pay | Admitting: Medical-Surgical

## 2023-03-14 DIAGNOSIS — R52 Pain, unspecified: Secondary | ICD-10-CM

## 2023-03-15 ENCOUNTER — Other Ambulatory Visit: Payer: Self-pay | Admitting: Medical-Surgical

## 2023-03-16 ENCOUNTER — Encounter: Payer: Self-pay | Admitting: Cardiovascular Disease

## 2023-03-16 NOTE — Progress Notes (Signed)
Cardiology Office Note:    Date:  03/16/2023   ID:  Linda [name], DOB Feb 09, 1981, MRN [id number]  PCP:  Christen Butter, NP   [facility] Providers Cardiologist:  Makila Colombe  Click to update primary MD,subspecialty MD or APP then REFRESH:1}    Referring MD: Christen Butter, NP   Chief Complaint  Patient presents with   Hypertension          History of Present Illness:    Linda [name] is a 42 y.o. female with a hx of HTN, DVT  Has had palpitations for a year  Throughout the day , Some days are worse than other  Not dependent on her diet,  Does not drink much caffeine   Sometimes described like heart is racing   Can occur with rest or exertion  Might last for few seconds or perhaps 5-10 minutes  Just waits it out  Associated with chest tightness   Clinically sounds like PVCs and also episodes of SVT    She is worn an event monitor which revealed sinus rhythm.  She had frequent/occasional premature ventricular contractions.  Several of her triggers correlated to these PVCs.  Echocardiogram from November 24, 2022 reveals normal left ventricular systolic function with an EF of 58%.  She has grade 1 diastolic dysfunction.  RV appears to be mildly enlarged.  Mild mitral regurgitation.  We discussed Jardiance . 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  Will add metoprolol 25 mg twice a day.  For now we will continue the current dose of amlodipine but have instructed her to hold her her amlodipine if she starts having episodes of orthostatic hypotension.  She will work on getting some regular aerobic exercise on her days off.  She has traditionally had a low potassium level.  Will recheck her potassium levels today.  She may respond well to spironolactone instead of amlodipine.  Will explore  this further when I see her again in 3 months.  Mar 18, 2023 [name] is seen today for follow up of her palpitations Has factor V Leiden defeciency and hx of  DVT Her potassium levels have been low We discussed starting  spironolactone and stopped the amlodipine     Past Medical History:  Diagnosis Date   Anxiety    Depression    DVT (deep venous thrombosis) (HCC)    Hypertension    IBS (irritable bowel syndrome)    Interstitial cystitis    Migraine    Pap smear abnormality of cervix/human papillomavirus (HPV) positive    Seizures (HCC)    Stroke (HCC)     Past Surgical History:  Procedure Laterality Date   CARPAL TUNNEL RELEASE     CESAREAN SECTION     INTERSTIM IMPLANT PLACEMENT     tenze     TUBAL LIGATION      Current Medications: No outpatient medications have been marked as taking for the 03/18/23 encounter (Office Visit) with Amaad Byers, Deloris Ping, MD.     Allergies:   Chocolate, Chocolate flavor, Cocoa butter, Iodinated contrast media, Propoxyphene, Sulfa antibiotics, Lisinopril, Oxycodone, and Codeine   Social History   Socioeconomic History   Marital status: Single    Spouse name: Not on file   Number of children: Not  on file   Years of education: Not on file   Highest education level: Not on file  Occupational History   Not on file  Tobacco Use   Smoking status: Former   Smokeless tobacco: Never  Vaping Use   Vaping Use: Never used  Substance and Sexual Activity   Alcohol use: Not Currently    Comment: occasionally   Drug use: Never   Sexual activity: Not on file  Other Topics Concern   Not on file  Social History Narrative   Not on file   Social Determinants of Health   Financial Resource Strain: Not on file  Food Insecurity: Not on file  Transportation Needs: Not on file  Physical Activity: Not on file  Stress: Not on file  Social Connections: Not on file     Family History: The patient's family history includes Breast cancer in her  maternal aunt and maternal great-grandmother; Clotting disorder in her father; Colon cancer in her paternal grandfather; Deep vein thrombosis in her father and paternal aunt; Diabetes in her father; Factor V Leiden deficiency in her maternal aunt; Hypertension in her father and mother; Kidney failure in her mother; Prostate cancer in her maternal grandfather; Stroke in her maternal grandmother and paternal grandfather.  ROS:   Please see the history of present illness.     All other systems reviewed and are negative.  EKGs/Labs/Other Studies Reviewed:    The following studies were reviewed today:   EKG:    Recent Labs: 12/12/2022: BUN 6; Creatinine, Ser 0.90; Potassium 4.1; Sodium 141  Recent Lipid Panel No results found for: "CHOL", "TRIG", "HDL", "CHOLHDL", "VLDL", "LDLCALC", "LDLDIRECT"   Risk Assessment/Calculations:         Physical Exam:     Physical Exam: There were no vitals taken for this visit.  No BP recorded.  {Refresh Note OR Click here to enter BP  :1}***    GEN:  Well nourished, well developed in no acute distress HEENT: Normal NECK: No JVD; No carotid bruits LYMPHATICS: No lymphadenopathy CARDIAC: RRR ***, no murmurs, rubs, gallops RESPIRATORY:  Clear to auscultation without rales, wheezing or rhonchi  ABDOMEN: Soft, non-tender, non-distended MUSCULOSKELETAL:  No edema; No deformity  SKIN: Warm and dry NEUROLOGIC:  Alert and oriented x 3      ASSESSMENT:    No diagnosis found.  PLAN:       PVCs:         2.  HTN:      3.  Obesity:            Medication Adjustments/Labs and Tests Ordered: Current medicines are reviewed at length with the patient today.  Concerns regarding medicines are outlined above.  No orders of the defined types were placed in this encounter.  No orders of the defined types were placed in this encounter.   There are no Patient Instructions on file for this visit.   Signed, Kristeen Miss, MD  03/16/2023  10:38 AM    [facility]

## 2023-03-17 NOTE — Telephone Encounter (Signed)
Not on current medication list.   

## 2023-03-18 ENCOUNTER — Encounter: Payer: Self-pay | Admitting: Medical-Surgical

## 2023-03-18 ENCOUNTER — Ambulatory Visit: Payer: BC Managed Care – PPO | Attending: Cardiovascular Disease | Admitting: Cardiovascular Disease

## 2023-03-24 ENCOUNTER — Telehealth: Payer: Self-pay | Admitting: Medical-Surgical

## 2023-03-24 NOTE — Telephone Encounter (Signed)
Patient called about Wegovy 2.4 mg followup on PA

## 2023-04-04 ENCOUNTER — Telehealth: Payer: Self-pay

## 2023-04-04 NOTE — Telephone Encounter (Addendum)
Initiated Prior authorization [id number] 2.4MG /0.75ML auto-injectors Via: Covermymeds Case/Key:[id number] Status: approved  as of 04/04/23 Reason:Authorization Expiration Date: October 04, 2023 Notified Pt via: Mychart

## 2023-04-14 ENCOUNTER — Encounter: Payer: Self-pay | Admitting: Medical-Surgical

## 2023-04-14 ENCOUNTER — Ambulatory Visit (INDEPENDENT_AMBULATORY_CARE_PROVIDER_SITE_OTHER): Payer: BC Managed Care – PPO | Admitting: Medical-Surgical

## 2023-04-14 ENCOUNTER — Other Ambulatory Visit: Payer: Self-pay | Admitting: Medical-Surgical

## 2023-04-14 VITALS — BP 115/77 | HR 80 | Resp 20 | Ht 65.5 in | Wt 173.0 lb

## 2023-04-14 DIAGNOSIS — K6289 Other specified diseases of anus and rectum: Secondary | ICD-10-CM

## 2023-04-14 DIAGNOSIS — E663 Overweight: Secondary | ICD-10-CM

## 2023-04-14 DIAGNOSIS — R5383 Other fatigue: Secondary | ICD-10-CM | POA: Diagnosis not present

## 2023-04-14 DIAGNOSIS — E559 Vitamin D deficiency, unspecified: Secondary | ICD-10-CM | POA: Diagnosis not present

## 2023-04-14 DIAGNOSIS — E876 Hypokalemia: Secondary | ICD-10-CM

## 2023-04-14 DIAGNOSIS — I10 Essential (primary) hypertension: Secondary | ICD-10-CM | POA: Diagnosis not present

## 2023-04-14 DIAGNOSIS — D582 Other hemoglobinopathies: Secondary | ICD-10-CM

## 2023-04-14 DIAGNOSIS — K589 Irritable bowel syndrome without diarrhea: Secondary | ICD-10-CM

## 2023-04-14 LAB — CBC WITH DIFFERENTIAL/PLATELET
Absolute Monocytes: 874 cells/uL (ref 200–950)
Eosinophils Relative: 1 %
Lymphs Abs: 2717 cells/uL (ref 850–3900)
Neutro Abs: 5786 cells/uL (ref 1500–7800)
Total Lymphocyte: 28.6 %
WBC: 9.5 10*3/uL (ref 3.8–10.8)

## 2023-04-14 MED ORDER — POLYMYXIN B-TRIMETHOPRIM 10000-0.1 UNIT/ML-% OP SOLN: 1.0000 [drp] | Freq: Four times a day (QID) | OPHTHALMIC | 0 refills | Status: AC

## 2023-04-14 MED ORDER — ONDANSETRON 8 MG PO TBDP
8.0000 mg | ORAL_TABLET | Freq: Three times a day (TID) | ORAL | 3 refills | Status: DC | PRN
Start: 2023-04-14 — End: 2023-09-08

## 2023-04-14 NOTE — Progress Notes (Addendum)
        Established patient visit  History, exam, impression, and plan:  1. Essential hypertension Pleasant 42 year old female presenting today to follow-up on hypertension.  She is currently taking amlodipine 5 mg daily, tolerating well without side effects.  Working to follow a low-sodium diet and exercising regularly.  Denies concerning symptoms.  Cardiopulmonary exam normal.  Checking labs as below.  Blood pressure at goal.  Continue amlodipine 5 mg daily. - CBC with Differential/Platelet - COMPLETE METABOLIC PANEL WITH GFR - Lipid panel  2. Overweight She has done beautifully on Wegovy and has lost greater than 20 pounds.  Continues to use Wegovy however spent a month without it and then restarted at the 2.4 mg weekly dose.  Has had some resulting nausea and vomiting but is starting to feel better.  Plans to continue Wegovy 2.4 mg weekly.  Refilling as needed Zofran.  - ondansetron (ZOFRAN-ODT) 8 MG disintegrating tablet; Take 1 tablet (8 mg total) by mouth every 8 (eight) hours as needed for nausea.  Dispense: 20 tablet; Refill: 3  3. Fatigue, unspecified type Continues to endorse fatigue despite weight loss, exercise, and healthier dietary practices.  History of iron deficiency as well as vitamin D deficiency.  Plan to recheck labs to evaluate for continued issues today. - Iron, TIBC and Ferritin Panel - VITAMIN D 25 Hydroxy (Vit-D Deficiency, Fractures) - TSH  4. Vitamin D deficiency Rechecking vitamin D. - VITAMIN D 25 Hydroxy (Vit-D Deficiency, Fractures)  5. Irritable bowel syndrome, unspecified type 6. Rectal mass History of IBS for many years.  A little over a year ago, developed bright red blood per rectum and was found to have a rectal mass.  She underwent surgical intervention for this and is due for follow-up.  She is requesting to have a GI provider referral placed to get her in contact with a different gastroenterologist.  Referral placed today. - Ambulatory referral to  Gastroenterology  Procedures performed this visit: None.  Return in about 6 months (around 10/14/2023) for chronic disease follow up.  __________________________________ Thayer Ohm, DNP, APRN, FNP-BC Primary Care and Sports Medicine [facility] [place]

## 2023-04-15 ENCOUNTER — Encounter: Payer: Self-pay | Admitting: Medical-Surgical

## 2023-04-15 DIAGNOSIS — E782 Mixed hyperlipidemia: Secondary | ICD-10-CM

## 2023-04-15 LAB — LIPID PANEL
Cholesterol: 245 mg/dL — ABNORMAL HIGH (ref <200)
HDL: 50 mg/dL (ref >=50)
LDL Cholesterol (Calc): 161 mg/dL (calc) — ABNORMAL HIGH
Non-HDL Cholesterol (Calc): 195 mg/dL (calc) — ABNORMAL HIGH (ref <130)
Total CHOL/HDL Ratio: 4.9 (calc) (ref <5.0)
Triglycerides: 183 mg/dL — ABNORMAL HIGH (ref <150)

## 2023-04-15 LAB — COMPLETE METABOLIC PANEL WITH GFR
AG Ratio: 1.6 (calc) (ref 1.0–2.5)
ALT: 36 U/L — ABNORMAL HIGH (ref 6–29)
AST: 33 U/L — ABNORMAL HIGH (ref 10–30)
Albumin: 4.6 g/dL (ref 3.6–5.1)
Alkaline phosphatase (APISO): 64 U/L (ref 31–125)
BUN: 10 mg/dL (ref 7–25)
CO2: 30 mmol/L (ref 20–32)
Calcium: 10.1 mg/dL (ref 8.6–10.2)
Chloride: 98 mmol/L (ref 98–110)
Creat: 0.95 mg/dL (ref 0.50–0.99)
Globulin: 2.9 g/dL (calc) (ref 1.9–3.7)
Glucose, Bld: 80 mg/dL (ref 65–99)
Potassium: 2.8 mmol/L — ABNORMAL LOW (ref 3.5–5.3)
Sodium: 139 mmol/L (ref 135–146)
Total Bilirubin: 0.8 mg/dL (ref 0.2–1.2)
Total Protein: 7.5 g/dL (ref 6.1–8.1)
eGFR: 78 mL/min/{1.73_m2} (ref >=60)

## 2023-04-15 LAB — IRON,TIBC AND FERRITIN PANEL
%SAT: 19 % (calc) (ref 16–45)
Ferritin: 26 ng/mL (ref 16–154)
Iron: 77 ug/dL (ref 40–190)
TIBC: 415 mcg/dL (calc) (ref 250–450)

## 2023-04-15 LAB — VITAMIN D 25 HYDROXY (VIT D DEFICIENCY, FRACTURES): Vit D, 25-Hydroxy: 46 ng/mL (ref 30–100)

## 2023-04-15 LAB — CBC WITH DIFFERENTIAL/PLATELET
Basophils Absolute: 29 cells/uL (ref 0–200)
Basophils Relative: 0.3 %
Eosinophils Absolute: 95 cells/uL (ref 15–500)
HCT: 49.6 % — ABNORMAL HIGH (ref 35.0–45.0)
Hemoglobin: 17.3 g/dL — ABNORMAL HIGH (ref 11.7–15.5)
MCH: 32.4 pg (ref 27.0–33.0)
MCHC: 34.9 g/dL (ref 32.0–36.0)
MCV: 92.9 fL (ref 80.0–100.0)
MPV: 10.4 fL (ref 7.5–12.5)
Monocytes Relative: 9.2 %
Neutrophils Relative %: 60.9 %
Platelets: 314 10*3/uL (ref 140–400)
RBC: 5.34 10*6/uL — ABNORMAL HIGH (ref 3.80–5.10)
RDW: 13.5 % (ref 11.0–15.0)

## 2023-04-15 LAB — TSH: TSH: 2.67 mIU/L

## 2023-04-15 MED ORDER — POTASSIUM CHLORIDE ER 20 MEQ PO TBCR: EXTENDED_RELEASE_TABLET | ORAL | 0 refills | Status: DC

## 2023-04-15 MED ORDER — ROSUVASTATIN CALCIUM 5 MG PO TABS: 5.0000 mg | ORAL_TABLET | Freq: Every day | ORAL | 3 refills | Status: DC

## 2023-04-15 NOTE — Addendum Note (Signed)
Addended byChristen Butter on: 04/15/2023 01:34 PM   Modules accepted: Orders

## 2023-04-28 DIAGNOSIS — E876 Hypokalemia: Secondary | ICD-10-CM | POA: Diagnosis not present

## 2023-04-28 DIAGNOSIS — D582 Other hemoglobinopathies: Secondary | ICD-10-CM | POA: Diagnosis not present

## 2023-04-28 DIAGNOSIS — E782 Mixed hyperlipidemia: Secondary | ICD-10-CM | POA: Diagnosis not present

## 2023-05-01 MED ORDER — POTASSIUM CHLORIDE CRYS ER 20 MEQ PO TBCR: 40.0000 meq | EXTENDED_RELEASE_TABLET | Freq: Every day | ORAL | 0 refills | Status: DC

## 2023-05-01 NOTE — Addendum Note (Signed)
Addended byChristen Butter on: 05/01/2023 05:54 PM   Modules accepted: Orders

## 2023-05-02 LAB — CBC
HCT: 48.2 % — ABNORMAL HIGH (ref 35.0–45.0)
Hemoglobin: 16.6 g/dL — ABNORMAL HIGH (ref 11.7–15.5)
MCH: 32.2 pg (ref 27.0–33.0)
MCHC: 34.4 g/dL (ref 32.0–36.0)
MCV: 93.6 fL (ref 80.0–100.0)
MPV: 11 fL (ref 7.5–12.5)
Platelets: 269 10*3/uL (ref 140–400)
RBC: 5.15 10*6/uL — ABNORMAL HIGH (ref 3.80–5.10)
RDW: 13.4 % (ref 11.0–15.0)
WBC: 10.2 10*3/uL (ref 3.8–10.8)

## 2023-05-02 LAB — COMPLETE METABOLIC PANEL WITH GFR
AG Ratio: 1.5 (calc) (ref 1.0–2.5)
ALT: 31 U/L — ABNORMAL HIGH (ref 6–29)
AST: 26 U/L (ref 10–30)
Albumin: 4.3 g/dL (ref 3.6–5.1)
Alkaline phosphatase (APISO): 64 U/L (ref 31–125)
BUN: 11 mg/dL (ref 7–25)
CO2: 29 mmol/L (ref 20–32)
Calcium: 9.6 mg/dL (ref 8.6–10.2)
Chloride: 100 mmol/L (ref 98–110)
Creat: 0.97 mg/dL (ref 0.50–0.99)
Globulin: 2.9 g/dL (calc) (ref 1.9–3.7)
Glucose, Bld: 83 mg/dL (ref 65–99)
Potassium: 2.9 mmol/L — ABNORMAL LOW (ref 3.5–5.3)
Sodium: 141 mmol/L (ref 135–146)
Total Bilirubin: 0.8 mg/dL (ref 0.2–1.2)
Total Protein: 7.2 g/dL (ref 6.1–8.1)
eGFR: 76 mL/min/{1.73_m2} (ref >=60)

## 2023-05-02 LAB — LIPID PANEL
Cholesterol: 201 mg/dL — ABNORMAL HIGH (ref <200)
HDL: 51 mg/dL (ref >=50)
LDL Cholesterol (Calc): 126 mg/dL (calc) — ABNORMAL HIGH
Non-HDL Cholesterol (Calc): 150 mg/dL (calc) — ABNORMAL HIGH (ref <130)
Total CHOL/HDL Ratio: 3.9 (calc) (ref <5.0)
Triglycerides: 126 mg/dL (ref <150)

## 2023-05-02 LAB — MAGNESIUM: Magnesium: 1.7 mg/dL (ref 1.5–2.5)

## 2023-05-04 ENCOUNTER — Other Ambulatory Visit: Payer: Self-pay

## 2023-05-04 DIAGNOSIS — E782 Mixed hyperlipidemia: Secondary | ICD-10-CM | POA: Diagnosis not present

## 2023-05-04 MED ORDER — MAGNESIUM OXIDE 400 MG PO TABS: 400.0000 mg | ORAL_TABLET | Freq: Every day | ORAL | 3 refills | Status: DC

## 2023-05-04 NOTE — Addendum Note (Signed)
Addended byChristen Butter on: 05/04/2023 07:16 PM   Modules accepted: Orders

## 2023-05-05 LAB — COMPLETE METABOLIC PANEL WITH GFR
AG Ratio: 1.6 (calc) (ref 1.0–2.5)
ALT: 28 U/L (ref 6–29)
AST: 21 U/L (ref 10–30)
Albumin: 4.5 g/dL (ref 3.6–5.1)
Alkaline phosphatase (APISO): 65 U/L (ref 31–125)
BUN/Creatinine Ratio: 16 (calc) (ref 6–22)
BUN: 16 mg/dL (ref 7–25)
CO2: 25 mmol/L (ref 20–32)
Calcium: 9.7 mg/dL (ref 8.6–10.2)
Chloride: 104 mmol/L (ref 98–110)
Creat: 1.02 mg/dL — ABNORMAL HIGH (ref 0.50–0.99)
Globulin: 2.9 g/dL (calc) (ref 1.9–3.7)
Glucose, Bld: 105 mg/dL — ABNORMAL HIGH (ref 65–99)
Potassium: 3.5 mmol/L (ref 3.5–5.3)
Sodium: 140 mmol/L (ref 135–146)
Total Bilirubin: 0.7 mg/dL (ref 0.2–1.2)
Total Protein: 7.4 g/dL (ref 6.1–8.1)
eGFR: 71 mL/min/{1.73_m2} (ref >=60)

## 2023-05-06 ENCOUNTER — Other Ambulatory Visit: Payer: Self-pay

## 2023-05-06 DIAGNOSIS — R899 Unspecified abnormal finding in specimens from other organs, systems and tissues: Secondary | ICD-10-CM

## 2023-05-13 ENCOUNTER — Telehealth: Payer: Self-pay | Admitting: *Deleted

## 2023-05-13 NOTE — Telephone Encounter (Signed)
Left patient a message that the office has 2 canceled appointments available as of this time today for 05/14/2023.

## 2023-05-13 NOTE — Progress Notes (Signed)
Last pap- 03/14/21- negative

## 2023-05-14 ENCOUNTER — Other Ambulatory Visit (HOSPITAL_COMMUNITY)
Admission: RE | Admit: 2023-05-14 | Discharge: 2023-05-14 | Disposition: A | Discharge status: Home / Self Care | Payer: BC Managed Care – PPO | Source: Ambulatory Visit | Attending: Obstetrics and Gynecology | Admitting: Obstetrics and Gynecology

## 2023-05-14 ENCOUNTER — Ambulatory Visit (INDEPENDENT_AMBULATORY_CARE_PROVIDER_SITE_OTHER): Payer: BC Managed Care – PPO | Admitting: Obstetrics and Gynecology

## 2023-05-14 ENCOUNTER — Encounter: Payer: Self-pay | Admitting: Obstetrics and Gynecology

## 2023-05-14 VITALS — BP 130/87 | HR 89 | Ht 65.5 in | Wt 169.0 lb

## 2023-05-14 DIAGNOSIS — Z01419 Encounter for gynecological examination (general) (routine) without abnormal findings: Secondary | ICD-10-CM

## 2023-05-14 DIAGNOSIS — Z1231 Encounter for screening mammogram for malignant neoplasm of breast: Secondary | ICD-10-CM

## 2023-05-14 DIAGNOSIS — N939 Abnormal uterine and vaginal bleeding, unspecified: Secondary | ICD-10-CM

## 2023-05-14 DIAGNOSIS — K6289 Other specified diseases of anus and rectum: Secondary | ICD-10-CM | POA: Diagnosis not present

## 2023-05-14 DIAGNOSIS — R899 Unspecified abnormal finding in specimens from other organs, systems and tissues: Secondary | ICD-10-CM | POA: Diagnosis not present

## 2023-05-14 NOTE — Patient Instructions (Signed)
It was nice meeting you today! You will see your results in the MyChart app within 1-2 weeks. I will let you know if you need to do anything after we get the test results.

## 2023-05-14 NOTE — Progress Notes (Signed)
ANNUAL EXAM Patient name: Linda [name] MRN [id number]  Date of birth: [birth year] Chief Complaint:   Annual Exam  History of Present Illness:   Linda [name] is a 42 y.o. [phone] with unsure LMP being seen today for a routine annual exam.   Current complaints:  AUB - irregular periods. Goes months at a time without a period, when she gets them they are very light and she can have spotting for up to 1 week. No cycles < 21d, prolonged or heavy bleeding. +hot flashes. I personally reviewed her normal pelvic US 05/22/20 and normal TSH (2.67) 04/14/23   Upstream - 05/14/23 1107       Pregnancy Intention Screening   Does the patient want to become pregnant in the next year? No    Does the patient's partner want to become pregnant in the next year? No    Would the patient like to discuss contraceptive options today? No      Contraception Wrap Up   Current Method Female Sterilization    End Method Female Sterilization    Contraception Counseling Provided No    How was the end contraceptive method provided? N/A            The pregnancy intention screening data noted above was reviewed. Potential methods of contraception were discussed. The patient elected to proceed with Female Sterilization.   Last pap 03/14/21. Results were: NILM w/ HRHPV negative. H/O abnormal pap: yes NILM/HPV+ 2019 per pt report Last mammogram: never. No immediate FHX Last colonoscopy: 2023 - due for colonscopy for history of a precancerous rectal mass     03/24/2022    3:12 PM 01/13/2022   10:16 AM 12/12/2021    4:16 PM 09/10/2021    3:15 PM 07/08/2021    1:31 PM  Depression screen PHQ 2/9  Decreased Interest 0 1 0 1 0  Down, Depressed, Hopeless 0 0 0 1 1  PHQ - 2 Score 0 1 0 2 1  Altered sleeping  2   2  Tired, decreased energy  1   1  Change in appetite  0   0  Feeling bad or failure about yourself   0   0  Trouble concentrating  0   0  Moving slowly or fidgety/restless  0   0  Suicidal thoughts  0   0  PHQ-9  Score  4   4  Difficult doing work/chores  Not difficult at all   Not difficult at all        01/13/2022   10:17 AM 07/08/2021    1:32 PM 04/02/2021   10:36 AM 01/03/2020    1:19 PM  GAD 7 : Generalized Anxiety Score  Nervous, Anxious, on Edge 2 1 1  0  Control/stop worrying 0 0 0 1  Worry too much - different things 1 0 0 1  Trouble relaxing 1 1 1 2   Restless 0 0 0 2  Easily annoyed or irritable 2 1 1 2   Afraid - awful might happen 0 0 0 0  Total GAD 7 Score 6 3 3 8   Anxiety Difficulty Not difficult at all Not difficult at all Not difficult at all Somewhat difficult   Review of Systems:   Pertinent items are noted in HPI Denies any headaches, blurred vision, fatigue, shortness of breath, chest pain, abdominal pain, abnormal vaginal discharge/itching/odor/irritation, problems with periods, bowel movements, urination, or intercourse unless otherwise stated above. Pertinent History Reviewed:  Reviewed past medical,surgical, social and family  history.  Reviewed problem list, medications and allergies. Physical Assessment:   Vitals:   05/14/23 1049  BP: 130/87  Pulse: 89  Weight: 169 lb (76.7 kg)  Height: 5' 5.5" (1.664 m)  Body mass index is 27.7 kg/m.        Physical Examination:   General appearance - well appearing, and in no distress  Mental status - alert, oriented to person, place, and time  Chest - respiratory effort normal  Heart - normal peripheral perfusion  Breasts - breasts appear normal, no suspicious masses, no skin or nipple changes or axillary nodes  Abdomen - soft, nontender, nondistended, no masses or organomegaly  Pelvic - VULVA: normal appearing vulva with no masses, tenderness or lesions  VAGINA: normal appearing vagina with normal color and discharge, no lesions  CERVIX: normal appearing cervix without discharge or lesions, no CMT  Thin prep pap is done with HR HPV cotesting  Chaperone present for exam  No results found for this or any previous visit  (from the past 24 hour(s)).  Assessment & Plan:  1) Well-Woman Exam Mammogram: ordered, schedule ASAP Colonoscopy: due for colonoscopy. Referral placed to GI Pap: Collected today GC/CT: Collected HIV/HCV: Ordered  2) AUB We reviewed a focused differential diagnosis for AUB that includes, but is not limited to anovulatory cycles (menopausal transition/POI, PCOS, hypothalamic hypogonadism), endometrial/endocervical polyps, fibroids, medication side effect (blood thinners, contraceptives, some antipsychotics), and thyroid disease. Given her history, exam & work up thus far, the most likely etiology is anovulatory cycles related to perimenopause. GC/CT/trich collected Pap collected PRL (anovulation, amenorrhea, galactorrhea) FSH/LH/E2 (sus hypothalamic) Pelvic ultrasound Recommend pt starts tracking cycles in phone. Reviewed expectations for perimenopause/menopause  Labs/procedures today:  Orders Placed This Encounter  Procedures   MM Digital Screening   US PELVIC COMPLETE WITH TRANSVAGINAL   FSH   Estradiol   Prolactin   HIV antibody (with reflex)   Hepatitis C Antibody   Ambulatory referral to Gastroenterology   Meds: No orders of the defined types were placed in this encounter.  Follow-up: Return in about 1 year (around 05/13/2024) for annual exam.  Lennart Pall, MD 05/14/2023 11:49 AM

## 2023-05-18 ENCOUNTER — Encounter: Payer: Self-pay | Admitting: Obstetrics and Gynecology

## 2023-05-18 DIAGNOSIS — B977 Papillomavirus as the cause of diseases classified elsewhere: Secondary | ICD-10-CM | POA: Insufficient documentation

## 2023-05-20 ENCOUNTER — Ambulatory Visit (INDEPENDENT_AMBULATORY_CARE_PROVIDER_SITE_OTHER): Payer: BC Managed Care – PPO

## 2023-05-20 DIAGNOSIS — N939 Abnormal uterine and vaginal bleeding, unspecified: Secondary | ICD-10-CM | POA: Diagnosis not present

## 2023-05-20 DIAGNOSIS — N854 Malposition of uterus: Secondary | ICD-10-CM | POA: Diagnosis not present

## 2023-05-21 ENCOUNTER — Ambulatory Visit: Payer: BC Managed Care – PPO | Admitting: Obstetrics and Gynecology

## 2023-05-26 ENCOUNTER — Encounter: Payer: Self-pay | Admitting: Obstetrics and Gynecology

## 2023-05-31 ENCOUNTER — Encounter: Payer: Self-pay | Admitting: Medical-Surgical

## 2023-06-01 ENCOUNTER — Telehealth: Payer: Self-pay | Admitting: *Deleted

## 2023-06-01 MED ORDER — SLYND 4 MG PO TABS: 1.0000 | ORAL_TABLET | Freq: Every day | ORAL | 3 refills | Status: DC

## 2023-06-01 NOTE — Telephone Encounter (Signed)
Patient has called in a panic about her birth control prescription not being at the [facility] on [address] in [place]. I advised her that Dr. Berton Lan might not have seen her last message(s) as of yet and that I would send her a message.

## 2023-06-02 MED ORDER — MEDROXYPROGESTERONE ACETATE 10 MG PO TABS: 10.0000 mg | ORAL_TABLET | Freq: Every day | ORAL | 0 refills | Status: DC

## 2023-06-02 NOTE — Addendum Note (Signed)
Addended byChristen Butter on: 06/02/2023 02:04 PM   Modules accepted: Orders

## 2023-06-05 MED ORDER — TRANEXAMIC ACID 650 MG PO TABS: 1300.0000 mg | ORAL_TABLET | Freq: Three times a day (TID) | ORAL | 0 refills | Status: DC

## 2023-06-05 NOTE — Addendum Note (Signed)
Addended byChristen Butter on: 06/05/2023 07:47 AM   Modules accepted: Orders

## 2023-06-07 ENCOUNTER — Telehealth: Payer: BC Managed Care – PPO | Admitting: Nurse Practitioner

## 2023-06-07 DIAGNOSIS — R399 Unspecified symptoms and signs involving the genitourinary system: Secondary | ICD-10-CM

## 2023-06-07 MED ORDER — CEPHALEXIN 500 MG PO CAPS
500.0000 mg | ORAL_CAPSULE | Freq: Two times a day (BID) | ORAL | 0 refills | Status: AC
Start: 2023-06-07 — End: 2023-06-14

## 2023-06-07 NOTE — Progress Notes (Signed)

## 2023-06-07 NOTE — Progress Notes (Signed)
I have spent 5 minutes in review of e-visit questionnaire, review and updating patient chart, medical decision making and response to patient.  ° °Zelda W Fleming, NP ° °  °

## 2023-06-12 ENCOUNTER — Other Ambulatory Visit: Payer: Self-pay | Admitting: Medical-Surgical

## 2023-06-16 ENCOUNTER — Ambulatory Visit: Payer: BC Managed Care – PPO | Admitting: Medical-Surgical

## 2023-06-16 ENCOUNTER — Telehealth: Payer: Self-pay | Admitting: *Deleted

## 2023-06-16 ENCOUNTER — Encounter: Payer: Self-pay | Admitting: Medical-Surgical

## 2023-06-16 VITALS — BP 121/89 | HR 92 | Resp 20 | Ht 65.5 in | Wt 169.0 lb

## 2023-06-16 DIAGNOSIS — E876 Hypokalemia: Secondary | ICD-10-CM

## 2023-06-16 DIAGNOSIS — N939 Abnormal uterine and vaginal bleeding, unspecified: Secondary | ICD-10-CM

## 2023-06-16 DIAGNOSIS — F411 Generalized anxiety disorder: Secondary | ICD-10-CM

## 2023-06-16 MED ORDER — FLUCONAZOLE 150 MG PO TABS: 150.0000 mg | ORAL_TABLET | Freq: Once | ORAL | 0 refills | Status: AC

## 2023-06-16 MED ORDER — VENLAFAXINE HCL ER 75 MG PO CP24: 75.0000 mg | ORAL_CAPSULE | Freq: Every day | ORAL | 1 refills | Status: DC

## 2023-06-16 NOTE — Telephone Encounter (Signed)
Left patient a message to call the office to schedule. Office has not received any phone messages that she called.

## 2023-06-16 NOTE — Progress Notes (Unsigned)
        Established patient visit  History, exam, impression, and plan:  1. Abnormal uterine bleeding Pleasant 42 year old female presenting today with complaints of abnormal uterine bleeding.  She has an OB/GYN already established however has not been able to get in contact with them regarding her bleeding.  She has been having heavy menstrual bleeding associated with cramping, clots, and fatigue for the last 2 weeks.  She was prescribed Slynd 1 tablet daily however this was on backorder at the pharmacy and she was unable to obtain this.  She reached out to me and we tried treating with Provera.  This was unhelpful so it was switched to Lysteda.  She reports that she has taken this, tolerated well without side effects however if she misses a single dose, the bleeding returns.  Also reports that the Lysteda gave her a UTI which was treated with antibiotics and now she has vaginal yeast infection symptoms.  Today, notes that she is only spotting as she was finally able to get the [facility] prescription and start it.  Is concerned because her recent pelvic ultrasound had an abnormal finding but is not sure what this was over with the recommended follow-up is.  With 2 weeks of bleeding, checking CBC today.  Just prior to her appointment, I was able to reach out to her OB/GYN office and they are fast tracking an appointment for her for further evaluation.  I did review the findings on the ultrasound and answered questions to my capability.  Would recommend she follow-up with OB/GYN to discuss options for investigation.  At this point, she is strongly considering a hysterectomy.  For now, continue Slynd 1 tablet daily.  Adding Diflucan for VVC. - CBC  2. Hypokalemia Recent history of hypokalemia that has been difficult to replete.  Rechecking CMP today.  Also adding cortisol for further evaluation.  Continue to aim for higher potassium food/beverage content and make sure to stay well-hydrated. - CMP14+EGFR -  Cortisol  3.  General Anxiety disorder Discussed her current symptoms.  She suffers from anxiety, depression, and irritability.  This has gotten much worse with recent medical concerns and she notes that she is snapping at people often.  Has been taking Effexor 37.5 mg daily however is not seeing much benefit from it.  Increasing Effexor to 75 mg daily and plan for close follow-up.  Procedures performed this visit: None.  Return in about 3 weeks (around 07/07/2023) for mood follow up.  __________________________________ Thayer Ohm, DNP, APRN, FNP-BC Primary Care and Sports Medicine [facility] [place]

## 2023-06-17 ENCOUNTER — Ambulatory Visit (INDEPENDENT_AMBULATORY_CARE_PROVIDER_SITE_OTHER): Payer: BC Managed Care – PPO

## 2023-06-17 ENCOUNTER — Encounter: Payer: Self-pay | Admitting: Medical-Surgical

## 2023-06-17 DIAGNOSIS — Z1231 Encounter for screening mammogram for malignant neoplasm of breast: Secondary | ICD-10-CM | POA: Diagnosis not present

## 2023-06-17 DIAGNOSIS — D751 Secondary polycythemia: Secondary | ICD-10-CM

## 2023-06-17 DIAGNOSIS — Z01419 Encounter for gynecological examination (general) (routine) without abnormal findings: Secondary | ICD-10-CM

## 2023-06-17 LAB — CMP14+EGFR
ALT: 22 IU/L (ref 0–32)
AST: 19 IU/L (ref 0–40)
Albumin: 4.3 g/dL (ref 3.9–4.9)
Alkaline Phosphatase: 70 IU/L (ref 44–121)
BUN/Creatinine Ratio: 16 (ref 9–23)
BUN: 18 mg/dL (ref 6–24)
Bilirubin Total: 0.8 mg/dL (ref 0.0–1.2)
CO2: 23 mmol/L (ref 20–29)
Calcium: 10.1 mg/dL (ref 8.7–10.2)
Chloride: 97 mmol/L (ref 96–106)
Creatinine, Ser: 1.12 mg/dL — ABNORMAL HIGH (ref 0.57–1.00)
Globulin, Total: 2.7 g/dL (ref 1.5–4.5)
Glucose: 96 mg/dL (ref 70–99)
Potassium: 3.4 mmol/L — ABNORMAL LOW (ref 3.5–5.2)
Sodium: 137 mmol/L (ref 134–144)
Total Protein: 7 g/dL (ref 6.0–8.5)
eGFR: 64 mL/min/{1.73_m2} (ref >59)

## 2023-06-17 LAB — CBC
Hematocrit: 50.9 % — ABNORMAL HIGH (ref 34.0–46.6)
Hemoglobin: 16.9 g/dL — ABNORMAL HIGH (ref 11.1–15.9)
MCH: 32.4 pg (ref 26.6–33.0)
MCHC: 33.2 g/dL (ref 31.5–35.7)
MCV: 98 fL — ABNORMAL HIGH (ref 79–97)
Platelets: 340 10*3/uL (ref 150–450)
RBC: 5.21 x10E6/uL (ref 3.77–5.28)
RDW: 12.7 % (ref 11.7–15.4)
WBC: 7.7 10*3/uL (ref 3.4–10.8)

## 2023-06-17 LAB — CORTISOL: Cortisol: 14.4 ug/dL (ref 6.2–19.4)

## 2023-06-18 ENCOUNTER — Encounter: Payer: Self-pay | Admitting: Obstetrics and Gynecology

## 2023-06-18 ENCOUNTER — Ambulatory Visit: Payer: BC Managed Care – PPO | Admitting: Obstetrics and Gynecology

## 2023-06-18 VITALS — BP 123/85 | HR 105 | Ht 65.5 in | Wt 169.0 lb

## 2023-06-18 DIAGNOSIS — R102 Pelvic and perineal pain: Secondary | ICD-10-CM | POA: Diagnosis not present

## 2023-06-18 DIAGNOSIS — N939 Abnormal uterine and vaginal bleeding, unspecified: Secondary | ICD-10-CM | POA: Diagnosis not present

## 2023-06-18 NOTE — Progress Notes (Signed)
RETURN GYNECOLOGY VISIT  Subjective:  Linda [name] is a 42 y.o. [id number] with LMP 06/03/23 presenting for AUB  Initially seen 7/25 for annual exam. Reported irregular cycles where she skips several months at a time and then ends up with a week long cycle. She was unsure when her LMP was at that time and we discussed that her symptoms were potentially related to menopause transition. I personally reviewed her work up from 05/14/23 including pap NILM/HPV neg, FSH 4.5, estradiol 71.6, PRL 7.8, and pelvic US on 7/31 notable for 4 x 3 x 3 echogenic focus concerning for focal endometrial lesion/polyp.   Around 1 week later, pt starting bleeding. It progressively worsened and became heavy with severe cramping pain and passing clots. There was a delay picking up slynd. She started provera x 10d with minimal improvement. Bleeding finally slowed w/ lysteda. She started slynd and now her bleeding is very light. Continues to have cramping pain.   Requests definitive management of her AUB with hysterectomy  PMH: VTE, stroke, migraine w/ aura, HTN PSH: Csx2, BTL, ?ablation vs cryosurgery (20s), transanal resection of rectal mass Past Medical History:  Diagnosis Date   Anxiety    Depression    DVT (deep venous thrombosis) (HCC)    Hypertension    IBS (irritable bowel syndrome)    Interstitial cystitis    Migraine    Pap smear abnormality of cervix/human papillomavirus (HPV) positive    Seizures (HCC)    Stroke [facility])    Past Surgical History:  Procedure Laterality Date   CARPAL TUNNEL RELEASE     CESAREAN SECTION     x2   INTERSTIM IMPLANT PLACEMENT     tenze     TUBAL LIGATION     Current Outpatient Medications on File Prior to Visit  Medication Sig Dispense Refill   amLODipine (NORVASC) 5 MG tablet Take 5 mg by mouth daily.     chlorthalidone (HYGROTON) 25 MG tablet Take 25 mg by mouth daily.     clonazePAM (KLONOPIN) 0.5 MG tablet Take 1 tablet (0.5 mg total) by mouth daily as needed for  anxiety. USE VERY SPARINGLY 20 tablet 0   Drospirenone (SLYND) 4 MG TABS Take 1 tablet (4 mg total) by mouth daily. 84 each 3   meloxicam (MOBIC) 15 MG tablet ONE TABLET EVERY MORNING WITH A MEAL FOR 2 WEEKS, THEN DAILY AS NEEDED FOR PAIN. 90 tablet 1   metoprolol tartrate (LOPRESSOR) 25 MG tablet Take 25 mg by mouth 2 (two) times daily.     ondansetron (ZOFRAN-ODT) 8 MG disintegrating tablet Take 1 tablet (8 mg total) by mouth every 8 (eight) hours as needed for nausea. 20 tablet 3   pantoprazole (PROTONIX) 40 MG tablet TAKE 1 TABLET BY MOUTH EVERY DAY 90 tablet 0   topiramate (TOPAMAX) 200 MG tablet TAKE 1 TABLET BY MOUTH EVERY DAY 90 tablet 1   venlafaxine XR (EFFEXOR XR) 75 MG 24 hr capsule Take 1 capsule (75 mg total) by mouth daily with breakfast. 90 capsule 1   WEGOVY 2.4 MG/0.75ML SOAJ Inject 2.4 mg into the skin once a week. 9 mL 3   rosuvastatin (CRESTOR) 5 MG tablet Take 1 tablet (5 mg total) by mouth daily. 90 tablet 3   No current facility-administered medications on file prior to visit.   Allergies  Allergen Reactions   Chocolate Anaphylaxis   Chocolate Flavor Anaphylaxis   Cocoa Butter Anaphylaxis   Iodinated Contrast Media Anaphylaxis   Propoxyphene Itching  Sulfa Antibiotics     Yeast infection   Lisinopril Cough   Oxycodone     Other reaction(s): Hallucinations   Codeine Hives and Itching   Social History   Socioeconomic History   Marital status: Single    Spouse name: Not on file   Number of children: Not on file   Years of education: Not on file   Highest education level: Not on file  Occupational History   Not on file  Tobacco Use   Smoking status: Former   Smokeless tobacco: Never  Vaping Use   Vaping status: Never Used  Substance and Sexual Activity   Alcohol use: Not Currently    Comment: occasionally   Drug use: Never   Sexual activity: Not on file  Other Topics Concern   Not on file  Social History Narrative   Not on file   Social  Determinants of Health   Financial Resource Strain: Not on file  Food Insecurity: No Food Insecurity (02/04/2022)   Received from [facility], [facility]   Hunger Vital Sign    Worried About Running Out of Food in the Last Year: Never true    Ran Out of Food in the Last Year: Never true  Transportation Needs: Not on file  Physical Activity: Not on file  Stress: No Stress Concern Present (03/07/2022)   Received from [facility], [facility]   [employer] of Occupational Health - Occupational Stress Questionnaire    Feeling of Stress : Not at all  Social Connections: Unknown (02/17/2022)   Received from [facility], [facility]   Social Network    Social Network: Not on file  Intimate Partner Violence: Unknown (01/20/2022)   Received from [facility], [facility]   HITS    Physically Hurt: Not on file    Insult or Talk Down To: Not on file    Threaten Physical Harm: Not on file    Scream or Curse: Not on file   Objective:   Vitals:   06/18/23 1542  BP: 123/85  Pulse: (!) 105  Weight: 169 lb (76.7 kg)  Height: 5' 5.5" (1.664 m)   General:  Alert, oriented and cooperative. Patient is in no acute distress.  Skin: Skin is warm and dry. No rash noted.   Cardiovascular: Normal heart rate noted  Respiratory: Normal respiratory effort, no problems with respiration noted  Abdomen: Soft, non-tender, non-distended    Assessment and Plan:  Linda [name] is a 42 y.o. with AUB & pelvic pain  AUB Pelvic pain - Given this episode of prolonged & heavy bleeding, recommended endometrial sampling. Reviewed option for office sampling with alternative for hysteroscopy D&C. Due to concerns about pain and prolonged period with cramping pt would prefer OR procedure. Reviewed procedure & recovery.  - Discussed options for management including PO progestins, LNG-IUD, endometrial ablation, and hysterectomy. Reviewed hysterectomy is definitive management of bleeding but carries highest  risk. Pt desires hysterectomy. - Plan for hsc D&C. If benign, will proceed with TLH/BS  Future Appointments  Date Time Provider Department Center  07/07/2023  8:50 AM Christen Butter, NP [facility] None    Lennart Pall, MD

## 2023-07-01 ENCOUNTER — Telehealth: Payer: Self-pay

## 2023-07-01 ENCOUNTER — Encounter: Payer: Self-pay | Admitting: Obstetrics and Gynecology

## 2023-07-01 NOTE — Telephone Encounter (Signed)
Called patient to schedule her procedure w/ Dr. Berton Lan. Left voicemail stating 08/19/23 was Dr. Aneta Mins first available. Requested patient call back to schedule. [phone].

## 2023-07-02 ENCOUNTER — Other Ambulatory Visit: Payer: Self-pay

## 2023-07-02 ENCOUNTER — Telehealth: Payer: Self-pay

## 2023-07-02 ENCOUNTER — Inpatient Hospital Stay: Payer: BC Managed Care – PPO | Attending: Medical Oncology

## 2023-07-02 ENCOUNTER — Inpatient Hospital Stay: Payer: BC Managed Care – PPO | Admitting: Medical Oncology

## 2023-07-02 ENCOUNTER — Encounter: Payer: Self-pay | Admitting: *Deleted

## 2023-07-02 ENCOUNTER — Encounter: Payer: Self-pay | Admitting: Medical Oncology

## 2023-07-02 ENCOUNTER — Other Ambulatory Visit: Payer: Self-pay | Admitting: *Deleted

## 2023-07-02 VITALS — BP 108/80 | HR 80 | Temp 98.0°F | Resp 18 | Ht 65.0 in | Wt 170.1 lb

## 2023-07-02 DIAGNOSIS — Z793 Long term (current) use of hormonal contraceptives: Secondary | ICD-10-CM | POA: Diagnosis not present

## 2023-07-02 DIAGNOSIS — D751 Secondary polycythemia: Secondary | ICD-10-CM

## 2023-07-02 DIAGNOSIS — Z9851 Tubal ligation status: Secondary | ICD-10-CM | POA: Insufficient documentation

## 2023-07-02 DIAGNOSIS — D45 Polycythemia vera: Secondary | ICD-10-CM

## 2023-07-02 DIAGNOSIS — Z885 Allergy status to narcotic agent status: Secondary | ICD-10-CM | POA: Insufficient documentation

## 2023-07-02 DIAGNOSIS — I10 Essential (primary) hypertension: Secondary | ICD-10-CM | POA: Insufficient documentation

## 2023-07-02 DIAGNOSIS — F419 Anxiety disorder, unspecified: Secondary | ICD-10-CM | POA: Diagnosis not present

## 2023-07-02 DIAGNOSIS — Z833 Family history of diabetes mellitus: Secondary | ICD-10-CM | POA: Insufficient documentation

## 2023-07-02 DIAGNOSIS — Z823 Family history of stroke: Secondary | ICD-10-CM | POA: Diagnosis not present

## 2023-07-02 DIAGNOSIS — Z832 Family history of diseases of the blood and blood-forming organs and certain disorders involving the immune mechanism: Secondary | ICD-10-CM | POA: Insufficient documentation

## 2023-07-02 DIAGNOSIS — Z8249 Family history of ischemic heart disease and other diseases of the circulatory system: Secondary | ICD-10-CM | POA: Diagnosis not present

## 2023-07-02 DIAGNOSIS — Z8673 Personal history of transient ischemic attack (TIA), and cerebral infarction without residual deficits: Secondary | ICD-10-CM

## 2023-07-02 DIAGNOSIS — Z888 Allergy status to other drugs, medicaments and biological substances status: Secondary | ICD-10-CM | POA: Insufficient documentation

## 2023-07-02 DIAGNOSIS — Z8042 Family history of malignant neoplasm of prostate: Secondary | ICD-10-CM | POA: Insufficient documentation

## 2023-07-02 DIAGNOSIS — Z79899 Other long term (current) drug therapy: Secondary | ICD-10-CM | POA: Insufficient documentation

## 2023-07-02 DIAGNOSIS — Z803 Family history of malignant neoplasm of breast: Secondary | ICD-10-CM | POA: Diagnosis not present

## 2023-07-02 DIAGNOSIS — Z7901 Long term (current) use of anticoagulants: Secondary | ICD-10-CM | POA: Insufficient documentation

## 2023-07-02 DIAGNOSIS — Z841 Family history of disorders of kidney and ureter: Secondary | ICD-10-CM | POA: Insufficient documentation

## 2023-07-02 DIAGNOSIS — Z86718 Personal history of other venous thrombosis and embolism: Secondary | ICD-10-CM

## 2023-07-02 DIAGNOSIS — Z882 Allergy status to sulfonamides status: Secondary | ICD-10-CM | POA: Insufficient documentation

## 2023-07-02 DIAGNOSIS — Z800 Family history of malignant neoplasm of digestive organs: Secondary | ICD-10-CM | POA: Diagnosis not present

## 2023-07-02 DIAGNOSIS — K589 Irritable bowel syndrome without diarrhea: Secondary | ICD-10-CM | POA: Diagnosis not present

## 2023-07-02 DIAGNOSIS — Z91041 Radiographic dye allergy status: Secondary | ICD-10-CM | POA: Insufficient documentation

## 2023-07-02 DIAGNOSIS — E876 Hypokalemia: Secondary | ICD-10-CM

## 2023-07-02 LAB — CBC WITH DIFFERENTIAL (CANCER CENTER ONLY)
Abs Immature Granulocytes: 0.04 10*3/uL (ref 0.00–0.07)
Basophils Absolute: 0.1 10*3/uL (ref 0.0–0.1)
Basophils Relative: 1 %
Eosinophils Absolute: 0.3 10*3/uL (ref 0.0–0.5)
Eosinophils Relative: 3 %
HCT: 44 % (ref 36.0–46.0)
Hemoglobin: 15.5 g/dL — ABNORMAL HIGH (ref 12.0–15.0)
Immature Granulocytes: 0 %
Lymphocytes Relative: 33 %
Lymphs Abs: 3.1 10*3/uL (ref 0.7–4.0)
MCH: 32.9 pg (ref 26.0–34.0)
MCHC: 35.2 g/dL (ref 30.0–36.0)
MCV: 93.4 fL (ref 80.0–100.0)
Monocytes Absolute: 0.7 10*3/uL (ref 0.1–1.0)
Monocytes Relative: 7 %
Neutro Abs: 5.4 10*3/uL (ref 1.7–7.7)
Neutrophils Relative %: 56 %
Platelet Count: 316 10*3/uL (ref 150–400)
RBC: 4.71 MIL/uL (ref 3.87–5.11)
RDW: 12.4 % (ref 11.5–15.5)
WBC Count: 9.6 10*3/uL (ref 4.0–10.5)
nRBC: 0 % (ref 0.0–0.2)

## 2023-07-02 LAB — CMP (CANCER CENTER ONLY)
ALT: 17 U/L (ref 0–44)
AST: 16 U/L (ref 15–41)
Albumin: 4.5 g/dL (ref 3.5–5.0)
Alkaline Phosphatase: 52 U/L (ref 38–126)
Anion gap: 11 (ref 5–15)
BUN: 12 mg/dL (ref 6–20)
CO2: 24 mmol/L (ref 22–32)
Calcium: 9.6 mg/dL (ref 8.9–10.3)
Chloride: 105 mmol/L (ref 98–111)
Creatinine: 0.92 mg/dL (ref 0.44–1.00)
GFR, Estimated: >60 mL/min (ref >60)
Glucose, Bld: 84 mg/dL (ref 70–99)
Potassium: 2.7 mmol/L — CL (ref 3.5–5.1)
Sodium: 140 mmol/L (ref 135–145)
Total Bilirubin: 0.6 mg/dL (ref 0.3–1.2)
Total Protein: 7.5 g/dL (ref 6.5–8.1)

## 2023-07-02 LAB — ANTITHROMBIN III: AntiThromb III Func: 112 % (ref 75–120)

## 2023-07-02 LAB — FERRITIN: Ferritin: 63 ng/mL (ref 11–307)

## 2023-07-02 NOTE — Progress Notes (Signed)
[facility] Telephone:([phone]) [phone]   [phone]  INITIAL CONSULT NOTE  Patient Care Team: Christen Butter, NP as PCP - General (Nurse Practitioner) Nahser, Deloris Ping, MD as PCP - Cardiology (Cardiology)  CHIEF COMPLAINTS/PURPOSE OF CONSULTATION:  "Erythrocytosis "  HISTORY OF PRESENTING ILLNESS:  Linda [name] 42 y.o. female with a very complex medical history who is referred to our office by their PCP Christen Butter NP for erythrocytosis.   She reports that she is ok. Has chronic fatigue and has a very complex medical history. She really wants to get to the bottom of her lab abnormalities and family history sooner rather than later.   She works for [employer].  Her erythrocytosis started a few months ago and has worsened some. Her last lab draw on 06/16/2023 showed a hemoglobin of 16.9 and a hematocrit of 50.9. MCV was 98. Platelets were normal at 340. WBC normal at 7.7. She has never been seen by a hematologist previously though she thinks she has had work up for factor V Leiden following one of her blood clots. She does not think this was positive. She also reports that her father had "really high iron levels" along with multiple blood clots.   Steroid use: No Smoking/COPD:No Sleep Apnea: No known sleep apnea but she reports that she does not sleep well Iron Supplementation: No Thalassemia: No known Testosterone: No although has possible PCOS DVT: Right Leg around 7-8 years ago- provoked (following significant injury). Pelvic about 6 years ago- provoked (following D&C surgery) Stroke: Ischemic around 10 years ago Clotting history: Yes see above. Father had clotting disease- unknown. Paternal aunt has Factor V leiden- patient thinks she had work up in the past for Factor V which she thinks was negative but is unsure.  High iron level: No  On OCP: Yes for heavy menses- planning on hysterectomy in Nov.  MEDICAL HISTORY:  Past Medical History:  Diagnosis Date   Anxiety     Depression    DVT (deep venous thrombosis) (HCC)    Hypertension    IBS (irritable bowel syndrome)    Interstitial cystitis    Migraine    Pap smear abnormality of cervix/human papillomavirus (HPV) positive    Seizures (HCC)    Stroke (HCC)     SURGICAL HISTORY: Past Surgical History:  Procedure Laterality Date   CARPAL TUNNEL RELEASE     CESAREAN SECTION     x2   INTERSTIM IMPLANT PLACEMENT     tenze     TUBAL LIGATION      SOCIAL HISTORY: Social History   Socioeconomic History   Marital status: Single    Spouse name: Not on file   Number of children: Not on file   Years of education: Not on file   Highest education level: Not on file  Occupational History   Not on file  Tobacco Use   Smoking status: Former   Smokeless tobacco: Never  Vaping Use   Vaping status: Never Used  Substance and Sexual Activity   Alcohol use: Not Currently    Comment: occasionally   Drug use: Never   Sexual activity: Not on file  Other Topics Concern   Not on file  Social History Narrative   Not on file   Social Determinants of Health   Financial Resource Strain: Not on file  Food Insecurity: No Food Insecurity (02/04/2022)   Received [facility], [facility]   Hunger Vital Sign    Worried About Running Out of  Food in the Last Year: Never true    Ran Out of Food in the Last Year: Never true  Transportation Needs: Not on file  Physical Activity: Not on file  Stress: No Stress Concern Present (03/07/2022)   Received from [facility], [facility] of Occupational Health - Occupational Stress Questionnaire    Feeling of Stress : Not at all  Social Connections: Unknown (02/17/2022)   Received from [facility], [facility]   Social Network    Social Network: Not on file  Intimate Partner Violence: Unknown (01/20/2022)   Received from [facility], [facility]   HITS    Physically Hurt: Not on file    Insult or Talk Down To: Not on file     Threaten Physical Harm: Not on file    Scream or Curse: Not on file    FAMILY HISTORY: Family History  Problem Relation Age of Onset   Hypertension Mother    Kidney failure Mother    Hypertension Father    Clotting disorder Father    Diabetes Father    Deep vein thrombosis Father    Stroke Maternal Grandmother    Prostate cancer Maternal Grandfather    Stroke Paternal Grandfather    Colon cancer Paternal Grandfather    Breast cancer Maternal Aunt    Factor V Leiden deficiency Maternal Aunt    Deep vein thrombosis Paternal Aunt    Breast cancer Maternal Great-grandmother     ALLERGIES:  is allergic to chocolate, chocolate flavor, cocoa butter, iodinated contrast media, propoxyphene, sulfa antibiotics, codeine, lisinopril, and oxycodone.  MEDICATIONS:  Current Outpatient Medications  Medication Sig Dispense Refill   amLODipine (NORVASC) 5 MG tablet Take 5 mg by mouth daily.     chlorthalidone (HYGROTON) 25 MG tablet Take 25 mg by mouth daily.     Drospirenone (SLYND) 4 MG TABS Take 1 tablet (4 mg total) by mouth daily. 84 each 3   meloxicam (MOBIC) 15 MG tablet ONE TABLET EVERY MORNING WITH A MEAL FOR 2 WEEKS, THEN DAILY AS NEEDED FOR PAIN. 90 tablet 1   ondansetron (ZOFRAN-ODT) 8 MG disintegrating tablet Take 1 tablet (8 mg total) by mouth every 8 (eight) hours as needed for nausea. 20 tablet 3   pantoprazole (PROTONIX) 40 MG tablet TAKE 1 TABLET BY MOUTH EVERY DAY 90 tablet 0   topiramate (TOPAMAX) 200 MG tablet TAKE 1 TABLET BY MOUTH EVERY DAY 90 tablet 1   venlafaxine XR (EFFEXOR XR) 75 MG 24 hr capsule Take 1 capsule (75 mg total) by mouth daily with breakfast. 90 capsule 1   WEGOVY 2.4 MG/0.75ML SOAJ Inject 2.4 mg into the skin once a week. 9 mL 3   clonazePAM (KLONOPIN) 0.5 MG tablet Take 1 tablet (0.5 mg total) by mouth daily as needed for anxiety. USE VERY SPARINGLY (Patient not taking: Reported on 07/02/2023) 20 tablet 0   No current facility-administered medications for  this visit.    REVIEW OF SYSTEMS:   Constitutional: ( - ) fevers, ( - )  chills , ( - ) night sweats Eyes: ( - ) blurriness of vision, ( - ) double vision, ( - ) watery eyes Ears, nose, mouth, throat, and face: ( - ) mucositis, ( - ) sore throat Respiratory: ( - ) cough, ( - ) dyspnea, ( - ) wheezes Cardiovascular: ( - ) palpitation, ( - ) chest discomfort, ( - ) lower extremity swelling Gastrointestinal:  ( - ) nausea, ( - )  heartburn, ( - ) change in bowel habits Skin: ( - ) abnormal skin rashes Lymphatics: ( - ) new lymphadenopathy, ( - ) easy bruising Neurological: ( - ) numbness, ( - ) tingling, ( - ) new weaknesses Behavioral/Psych: ( - ) mood change, ( - ) new changes  All other systems were reviewed with the patient and are negative.  PHYSICAL EXAMINATION: ECOG PERFORMANCE STATUS: 1 - Symptomatic but completely ambulatory  Vitals:   07/02/23 1356  BP: 108/80  Pulse: 80  Resp: 18  Temp: 98 F (36.7 C)  SpO2: 100%   Filed Weights   07/02/23 1356  Weight: 170 lb 1.9 oz (77.2 kg)    GENERAL: well appearing female in NAD  SKIN: skin color, texture, turgor are normal, no rashes or significant lesions EYES: conjunctiva are pink and non-injected, sclera clear OROPHARYNX: no exudate, no erythema; lips, buccal mucosa, and tongue normal  NECK: supple, non-tender LYMPH:  no palpable lymphadenopathy in the cervical, axillary or supraclavicular lymph nodes.  LUNGS: clear to auscultation and percussion with normal breathing effort HEART: regular rate & rhythm and no murmurs and no lower extremity edema ABDOMEN: soft, non-tender, non-distended, normal bowel sounds Musculoskeletal: no cyanosis of digits and no clubbing  PSYCH: alert & oriented x 3, fluent speech NEURO: no focal motor/sensory deficits  LABORATORY DATA:  I have reviewed the data as listed    Latest Ref Rng & Units 06/16/2023    8:56 AM 04/28/2023    2:12 AM 04/14/2023    2:22 PM  CBC  WBC 3.4 - 10.8 x10E3/uL  7.7  10.2  9.5   Hemoglobin 11.1 - 15.9 g/dL 56.2  13.0  86.5   Hematocrit 34.0 - 46.6 % 50.9  48.2  49.6   Platelets 150 - 450 x10E3/uL 340  269  314        Latest Ref Rng & Units 06/16/2023    8:56 AM 05/14/2023   11:32 AM 05/04/2023    8:13 AM  CMP  Glucose 70 - 99 mg/dL 96   784   BUN 6 - 24 mg/dL 18   16   Creatinine 6.96 - 1.00 mg/dL 2.95   2.84   Sodium 132 - 144 mmol/L 137   140   Potassium 3.5 - 5.2 mmol/L 3.4  3.2  3.5   Chloride 96 - 106 mmol/L 97   104   CO2 20 - 29 mmol/L 23   25   Calcium 8.7 - 10.2 mg/dL 44.0   9.7   Total Protein 6.0 - 8.5 g/dL 7.0   7.4   Total Bilirubin 0.0 - 1.2 mg/dL 0.8   0.7   Alkaline Phos 44 - 121 IU/L 70     AST 0 - 40 IU/L 19   21   ALT 0 - 32 IU/L 22   28    ASSESSMENT & PLAN Linda [name] is a 42 y.o. female who was referred to [place] for erythrocytosis though I am also concerned about her clotting history and family history of likely hemochromatosis. She is needing work up for all three of these concerns today:   Today we discussed that there are two types of polycythemia, Primary polycythemia and secondary polycythemia. Primary polycythemia is overproduction of red blood cells due to a driver mutation. The most common mutation is the JAK2 [id number] (95% of cases), but there are other mutations which can cause this disorder. Primary polycythemia is a myeloproliferative neoplasm which may require cytoreductive therapy to decrease  risk of thrombosis. This can consist of medications or phlebotomy to drive down the red blood cell counts. Secondary polycythemia is polycythemia driven by low oxygen levels. This represents an appropriate response of the body attempting to increase red cell volume. Causes of secondary polycythemia include smoking (most common), obstructive sleep apnea (OSA), or living at altitude. This can also be caused by testosterone supplementation. Certain thalassemias can present with marked erythrocytosis , but normal hemoglobin. Secondary  polycythemia does not have the same level of thrombotic risk and therefore does not require cytoreductive therapy or phlebotomy.   In terms of her history of multiple blood clots which appear to be provoked I voiced my concern for her OCP use with history of blood clots/stroke. We had a long discussion of the risks of subsequent clots/death. Per patient the OCP is used due to severe menstrual bleeding. She is scheduled to have a hysterectomy in November and plans on coming off of the birth control before/after that time. She declines my advisement to stop the OCP. For now we will obtain hypercoagulable work up to help justify chronic anticoagulation use. For now I have suggested she take a once daily full strength asa until labs have resulted. We will also need to discuss anticoagulation following her upcoming surgery. In the past she has used Lovenox which helped prevent blood clot formation.   #Polycythemia, of unknown origin --patient is a non smoker and does not use any testosterone containing products --Lower concern for OSA- if all labs are normal will refer for a sleep study at follow up --will order MPN workup to include JAK2 with reflex and BCR/ABL FISH -- stay hydrated with water  #History of DVT/stroke - Start full strength asa once daily - Advised to work with GYN/PCP to stop OCP - Hypercoagulable work up  # Family history of likely hereditary hemochromatosis  - Labs pending  - advised to start asa as stated above   NOTE: Given extent of labs being collected today we will hold on erythropoietin, reticulocyte count today but will collect at follow up along with repeat CBC w/ diff. She will get hereditary hemochromatosis work up at [facility] at her convenience.   RTC 3 weeks APP, labs  No orders of the defined types were placed in this encounter.  All questions were answered. The patient knows to call the clinic with any problems, questions or concerns.  I have spent a total of 60  minutes minutes of face-to-face and non-face-to-face time, preparing to see the patient, obtaining and/or reviewing separately obtained history, performing a medically appropriate examination, counseling and educating the patient, ordering medications/tests/procedures, referring and communicating with other health care professionals, documenting clinical information in the electronic health record, independently interpreting results and communicating results to the patient, and care coordination.    Clent Jacks PA-C Department of Hematology/Oncology [facility]

## 2023-07-02 NOTE — Telephone Encounter (Signed)
Patient called me back this afternoon to confirm she's available for surgery on 08/19/23 w/ Dr. Berton Lan. Confirmed patient must arrive at [facility] at 12:30 pm and surgery begins at 2:30 pm. Provided pre-op instructions over the phone.

## 2023-07-02 NOTE — Progress Notes (Signed)
Lab brought to my attention a panic potassium level of 2.7. Clent Jacks, PA, notified. Per Maralyn Sago, have patient take potassium 20 mEQ po bid. Recheck potassium early next week.   I called patient and informed her that her potassium level is 2.7. Patient stated,"I have plenty of potassium at home." I instructed her to take 20 mEQ by mouth twice a day. She said  she would try to take it with food. Also, she is coming in next Tuesday, 9/17, for a repeat potassium check. She is aware of the appointment. Lab has been entered.  She is coming in tomorrow, 07/03/23 for a lab only appointment. This lab is to draw a hereditary hemochromatosis  DNA, PCR lab. She is aware of the date and time of appointment. Lab has been entered per Melody Haver. She verbalized understanding of appointments.

## 2023-07-03 ENCOUNTER — Inpatient Hospital Stay: Payer: BC Managed Care – PPO

## 2023-07-03 DIAGNOSIS — Z91041 Radiographic dye allergy status: Secondary | ICD-10-CM | POA: Diagnosis not present

## 2023-07-03 DIAGNOSIS — Z8673 Personal history of transient ischemic attack (TIA), and cerebral infarction without residual deficits: Secondary | ICD-10-CM | POA: Diagnosis not present

## 2023-07-03 DIAGNOSIS — Z7901 Long term (current) use of anticoagulants: Secondary | ICD-10-CM | POA: Diagnosis not present

## 2023-07-03 DIAGNOSIS — I10 Essential (primary) hypertension: Secondary | ICD-10-CM | POA: Diagnosis not present

## 2023-07-03 DIAGNOSIS — Z8042 Family history of malignant neoplasm of prostate: Secondary | ICD-10-CM | POA: Diagnosis not present

## 2023-07-03 DIAGNOSIS — D751 Secondary polycythemia: Secondary | ICD-10-CM | POA: Diagnosis not present

## 2023-07-03 DIAGNOSIS — Z86718 Personal history of other venous thrombosis and embolism: Secondary | ICD-10-CM | POA: Diagnosis not present

## 2023-07-03 DIAGNOSIS — Z888 Allergy status to other drugs, medicaments and biological substances status: Secondary | ICD-10-CM | POA: Diagnosis not present

## 2023-07-03 DIAGNOSIS — E876 Hypokalemia: Secondary | ICD-10-CM

## 2023-07-03 DIAGNOSIS — Z9851 Tubal ligation status: Secondary | ICD-10-CM | POA: Diagnosis not present

## 2023-07-03 DIAGNOSIS — F419 Anxiety disorder, unspecified: Secondary | ICD-10-CM | POA: Diagnosis not present

## 2023-07-03 DIAGNOSIS — Z885 Allergy status to narcotic agent status: Secondary | ICD-10-CM | POA: Diagnosis not present

## 2023-07-03 DIAGNOSIS — K589 Irritable bowel syndrome without diarrhea: Secondary | ICD-10-CM | POA: Diagnosis not present

## 2023-07-03 DIAGNOSIS — Z800 Family history of malignant neoplasm of digestive organs: Secondary | ICD-10-CM | POA: Diagnosis not present

## 2023-07-03 DIAGNOSIS — Z79899 Other long term (current) drug therapy: Secondary | ICD-10-CM | POA: Diagnosis not present

## 2023-07-03 DIAGNOSIS — Z793 Long term (current) use of hormonal contraceptives: Secondary | ICD-10-CM | POA: Diagnosis not present

## 2023-07-03 DIAGNOSIS — Z882 Allergy status to sulfonamides status: Secondary | ICD-10-CM | POA: Diagnosis not present

## 2023-07-03 LAB — CMP (CANCER CENTER ONLY)
ALT: 15 U/L (ref 0–44)
AST: 14 U/L — ABNORMAL LOW (ref 15–41)
Albumin: 4.3 g/dL (ref 3.5–5.0)
Alkaline Phosphatase: 47 U/L (ref 38–126)
Anion gap: 9 (ref 5–15)
BUN: 15 mg/dL (ref 6–20)
CO2: 26 mmol/L (ref 22–32)
Calcium: 9.1 mg/dL (ref 8.9–10.3)
Chloride: 106 mmol/L (ref 98–111)
Creatinine: 0.99 mg/dL (ref 0.44–1.00)
GFR, Estimated: >60 mL/min (ref >60)
Glucose, Bld: 84 mg/dL (ref 70–99)
Potassium: 3.2 mmol/L — ABNORMAL LOW (ref 3.5–5.1)
Sodium: 141 mmol/L (ref 135–145)
Total Bilirubin: 0.5 mg/dL (ref 0.3–1.2)
Total Protein: 6.8 g/dL (ref 6.5–8.1)

## 2023-07-03 LAB — IRON AND IRON BINDING CAPACITY (CC-WL,HP ONLY)
Iron: 78 ug/dL (ref 28–170)
Saturation Ratios: 19 % (ref 10.4–31.8)
TIBC: 402 ug/dL (ref 250–450)
UIBC: 324 ug/dL (ref 148–442)

## 2023-07-03 LAB — TESTOSTERONE: Testosterone: 5 ng/dL — ABNORMAL LOW (ref 8–60)

## 2023-07-04 LAB — LUPUS ANTICOAGULANT PANEL
DRVVT: 38.6 s (ref 0.0–47.0)
PTT Lupus Anticoagulant: 32.1 s (ref 0.0–43.5)

## 2023-07-04 LAB — BETA-2-GLYCOPROTEIN I ABS, IGG/M/A
Beta-2 Glyco I IgG: <9 GPI IgG units (ref 0–20)
Beta-2-Glycoprotein I IgA: <9 GPI IgA units (ref 0–25)
Beta-2-Glycoprotein I IgM: <9 GPI IgM units (ref 0–32)

## 2023-07-04 LAB — PROTEIN S, TOTAL: Protein S Ag, Total: 90 % (ref 60–150)

## 2023-07-04 LAB — CARDIOLIPIN ANTIBODIES, IGG, IGM, IGA
Anticardiolipin IgA: <9 U/mL (ref 0–11)
Anticardiolipin IgG: <9 GPL U/mL (ref 0–14)
Anticardiolipin IgM: <9 [MPL'U]/mL (ref 0–12)

## 2023-07-04 LAB — PROTEIN S ACTIVITY: Protein S Activity: 110 % (ref 63–140)

## 2023-07-04 LAB — PROTEIN C ACTIVITY: Protein C Activity: 138 % (ref 73–180)

## 2023-07-04 LAB — PROTEIN C, TOTAL: Protein C, Total: 102 % (ref 60–150)

## 2023-07-05 LAB — HOMOCYSTEINE: Homocysteine: 19 umol/L — ABNORMAL HIGH (ref 0.0–14.5)

## 2023-07-06 ENCOUNTER — Other Ambulatory Visit: Payer: Self-pay | Admitting: Medical Oncology

## 2023-07-06 DIAGNOSIS — E876 Hypokalemia: Secondary | ICD-10-CM

## 2023-07-07 ENCOUNTER — Inpatient Hospital Stay: Payer: BC Managed Care – PPO

## 2023-07-07 ENCOUNTER — Ambulatory Visit (INDEPENDENT_AMBULATORY_CARE_PROVIDER_SITE_OTHER): Payer: BC Managed Care – PPO | Admitting: Medical-Surgical

## 2023-07-07 DIAGNOSIS — Z91199 Patient's noncompliance with other medical treatment and regimen due to unspecified reason: Secondary | ICD-10-CM

## 2023-07-07 LAB — PROTHROMBIN GENE MUTATION

## 2023-07-07 NOTE — Progress Notes (Unsigned)
   Complete physical exam  Patient: Linda [name]   DOB: 08/09/1998   42 y.o. Female  MRN: [id number]  Subjective:    No chief complaint on file.   Linda [name] is a 42 y.o. female who presents today for a complete physical exam. She reports consuming a {diet types:17450} diet. {types:19826} She generally feels {DESC; WELL/FAIRLY WELL/POORLY:18703}. She reports sleeping {DESC; WELL/FAIRLY WELL/POORLY:18703}. She {does/does not:200015} have additional problems to discuss today.    Most recent fall risk assessment:    04/16/2022   10:42 AM  Fall Risk   Falls in the past year? 0  Number falls in past yr: 0  Injury with Fall? 0  Risk for fall due to : No Fall Risks  Follow up Falls evaluation completed     Most recent depression screenings:    04/16/2022   10:42 AM 03/07/2021   10:46 AM  PHQ 2/9 Scores  PHQ - 2 Score 0 0  PHQ- 9 Score 5     {VISON DENTAL STD PSA (Optional):27386}  {History (Optional):23778}  Patient Care Team: Azan Maneri, NP as PCP - General (Nurse Practitioner)   Outpatient Medications Prior to Visit  Medication Sig   fluticasone (FLONASE) 50 MCG/ACT nasal spray Place 2 sprays into both nostrils in the morning and at bedtime. After 7 days, reduce to once daily.   norgestimate-ethinyl estradiol (SPRINTEC 28) 0.25-35 MG-MCG tablet Take 1 tablet by mouth daily.   Nystatin POWD Apply liberally to affected area 2 times per day   spironolactone (ALDACTONE) 100 MG tablet Take 1 tablet (100 mg total) by mouth daily.   No facility-administered medications prior to visit.    ROS        Objective:     There were no vitals taken for this visit. {Vitals History (Optional):23777}  Physical Exam   No results found for any visits on 05/22/22. {Show previous labs (optional):23779}    Assessment & Plan:    Routine Health Maintenance and Physical Exam  Immunization History  Administered Date(s) Administered   DTaP 10/23/1999, 12/19/1999,  02/27/2000, 11/12/2000, 05/28/2004   Hepatitis A 03/24/2008, 03/30/2009   Hepatitis B 08/10/1999, 09/17/1999, 02/27/2000   HiB (PRP-OMP) 10/23/1999, 12/19/1999, 02/27/2000, 11/12/2000   IPV 10/23/1999, 12/19/1999, 08/17/2000, 05/28/2004   Influenza,inj,Quad PF,6+ Mos 06/30/2014   Influenza-Unspecified 09/29/2012   MMR 08/17/2001, 05/28/2004   Meningococcal Polysaccharide 03/29/2012   Pneumococcal Conjugate-13 11/12/2000   Pneumococcal-Unspecified 02/27/2000, 05/12/2000   Tdap 03/29/2012   Varicella 08/17/2000, 03/24/2008    Health Maintenance  Topic Date Due   HIV Screening  Never done   Hepatitis C Screening  Never done   INFLUENZA VACCINE  05/20/2022   PAP-Cervical Cytology Screening  05/22/2022 (Originally 08/08/2020)   PAP SMEAR-Modifier  05/22/2022 (Originally 08/08/2020)   TETANUS/TDAP  05/22/2022 (Originally 03/29/2022)   HPV VACCINES  Discontinued   COVID-19 Vaccine  Discontinued    Discussed health benefits of physical activity, and encouraged her to engage in regular exercise appropriate for her age and condition.  Problem List Items Addressed This Visit   None Visit Diagnoses     Annual physical exam    -  Primary   Cervical cancer screening       Need for Tdap vaccination          No follow-ups on file.     Ronisha Herringshaw, NP   

## 2023-07-09 LAB — BCR ABL1 FISH (GENPATH)

## 2023-07-09 LAB — JAK 2 V617F (GENPATH)

## 2023-07-10 ENCOUNTER — Encounter: Payer: Self-pay | Admitting: Nurse Practitioner

## 2023-07-10 ENCOUNTER — Other Ambulatory Visit: Payer: Self-pay | Admitting: Medical Oncology

## 2023-07-10 DIAGNOSIS — R7989 Other specified abnormal findings of blood chemistry: Secondary | ICD-10-CM

## 2023-07-13 NOTE — Telephone Encounter (Signed)
This seems to be going around in circles. Wanted to make sure to get it to the right place.

## 2023-07-14 ENCOUNTER — Encounter: Payer: Self-pay | Admitting: Obstetrics and Gynecology

## 2023-07-14 LAB — FACTOR 5 LEIDEN

## 2023-07-16 ENCOUNTER — Encounter: Payer: Self-pay | Admitting: *Deleted

## 2023-07-17 ENCOUNTER — Inpatient Hospital Stay (HOSPITAL_BASED_OUTPATIENT_CLINIC_OR_DEPARTMENT_OTHER): Payer: BC Managed Care – PPO | Admitting: Medical Oncology

## 2023-07-17 ENCOUNTER — Other Ambulatory Visit: Payer: Self-pay | Admitting: Medical Oncology

## 2023-07-17 DIAGNOSIS — R7989 Other specified abnormal findings of blood chemistry: Secondary | ICD-10-CM | POA: Diagnosis not present

## 2023-07-17 DIAGNOSIS — I10 Essential (primary) hypertension: Secondary | ICD-10-CM | POA: Diagnosis not present

## 2023-07-17 DIAGNOSIS — Z86718 Personal history of other venous thrombosis and embolism: Secondary | ICD-10-CM | POA: Diagnosis not present

## 2023-07-17 DIAGNOSIS — Z7901 Long term (current) use of anticoagulants: Secondary | ICD-10-CM | POA: Diagnosis not present

## 2023-07-17 DIAGNOSIS — Z8042 Family history of malignant neoplasm of prostate: Secondary | ICD-10-CM | POA: Diagnosis not present

## 2023-07-17 DIAGNOSIS — Z79899 Other long term (current) drug therapy: Secondary | ICD-10-CM | POA: Diagnosis not present

## 2023-07-17 DIAGNOSIS — D751 Secondary polycythemia: Secondary | ICD-10-CM | POA: Diagnosis not present

## 2023-07-17 DIAGNOSIS — K589 Irritable bowel syndrome without diarrhea: Secondary | ICD-10-CM | POA: Diagnosis not present

## 2023-07-17 DIAGNOSIS — Z882 Allergy status to sulfonamides status: Secondary | ICD-10-CM | POA: Diagnosis not present

## 2023-07-17 DIAGNOSIS — Z9851 Tubal ligation status: Secondary | ICD-10-CM | POA: Diagnosis not present

## 2023-07-17 DIAGNOSIS — Z888 Allergy status to other drugs, medicaments and biological substances status: Secondary | ICD-10-CM | POA: Diagnosis not present

## 2023-07-17 DIAGNOSIS — Z885 Allergy status to narcotic agent status: Secondary | ICD-10-CM | POA: Diagnosis not present

## 2023-07-17 DIAGNOSIS — F419 Anxiety disorder, unspecified: Secondary | ICD-10-CM | POA: Diagnosis not present

## 2023-07-17 DIAGNOSIS — Z91041 Radiographic dye allergy status: Secondary | ICD-10-CM | POA: Diagnosis not present

## 2023-07-17 DIAGNOSIS — Z793 Long term (current) use of hormonal contraceptives: Secondary | ICD-10-CM | POA: Diagnosis not present

## 2023-07-17 DIAGNOSIS — Z800 Family history of malignant neoplasm of digestive organs: Secondary | ICD-10-CM | POA: Diagnosis not present

## 2023-07-17 DIAGNOSIS — Z8673 Personal history of transient ischemic attack (TIA), and cerebral infarction without residual deficits: Secondary | ICD-10-CM | POA: Diagnosis not present

## 2023-07-17 MED ORDER — RIVAROXABAN 10 MG PO TABS: 10.0000 mg | ORAL_TABLET | Freq: Every day | ORAL | 9 refills | Status: DC

## 2023-07-17 NOTE — Progress Notes (Signed)
Virtual Visit Progress Note  Linda [name],you are scheduled for a virtual visit with your provider today.    Just as we do with appointments in the office, we must obtain your consent to participate.  Your consent will be active for this visit and any virtual visit you may have with one of our providers in the next 365 days.    If you have a MyChart account, I can also send a copy of this consent to you electronically.  All virtual visits are billed to your insurance company just like a traditional visit in the office.  As this is a virtual visit, video technology does not allow for your provider to perform a traditional examination.  This may limit your provider's ability to fully assess your condition.  If your provider identifies any concerns that need to be evaluated in person or the need to arrange testing such as labs, EKG, etc, we will make arrangements to do so.    Although advances in technology are sophisticated, we cannot ensure that it will always work on either your end or our end.  If the connection with a video visit is poor, we may have to switch to a telephone visit.  With either a video or telephone visit, we are not always able to ensure that we have a secure connection.   I need to obtain your verbal consent now.   Are you willing to proceed with your visit today?   Linda [name] has provided verbal consent on 07/17/2023 for a virtual visit (video or telephone).   Linda Chestnut, PA-C 07/17/2023  2:43 PM    I connected with Linda [name] on 07/17/23 at 11:00 AM EDT by telephone visit and verified that I am speaking with the correct person using two identifiers.   I discussed the limitations, risks, security and privacy concerns of performing an evaluation and management service by telemedicine and the availability of in-person appointments. I also discussed with the patient that there may be a patient responsible charge related to this service. The patient expressed understanding  and agreed to proceed.   Other persons participating in the visit and their role in the encounter: None  Patient's location: Home Provider's location: Clinic   Chief Complaint: Discuss labs results  Patient Care Team: Christen Butter, NP as PCP - General (Nurse Practitioner) Nahser, Deloris Ping, MD as PCP - Cardiology (Cardiology)   Name of the patient: Linda [name]  [id number]  Sep 01, 1981   Date of visit: 07/17/23  History of Presenting Illness-   Was seen as a new patient on 07/02/2023 for polycythemia. 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 She was found to have an elevated homocystine level.   Currently she is on an OCP due to heavy menstrual cycles- which she was advised to stop but also discuss with her PCP. Has upcoming uterine cyst removal on 08/19/2023 followed by a hysterectomy at a later date.   No history of GI bleed. Takes meloxicam daily for joint pain.   There has been no bleeding to her knowledge: denies epistaxis, gingivitis, hemoptysis, hematemesis, hematuria, melena, excessive bruising, blood donation.    Review of systems- As stated above in HPI  Allergies  Allergen Reactions   Chocolate Anaphylaxis   Chocolate Flavor Anaphylaxis   Cocoa Butter Anaphylaxis   Iodinated Contrast Media Anaphylaxis   Propoxyphene Itching  Sulfa Antibiotics Other (See Comments)    Yeast infection   Codeine Hives and Itching   Lisinopril Cough   Oxycodone Other (See Comments)    Other reaction(s): Hallucinations    Past Medical History:  Diagnosis Date   Anxiety    Depression    DVT (deep venous thrombosis) (HCC)    Hypertension    IBS (irritable bowel syndrome)    Interstitial cystitis    Migraine    Pap smear abnormality of cervix/human papillomavirus (HPV) positive    Seizures (HCC)    Stroke (HCC)     Past Surgical  History:  Procedure Laterality Date   CARPAL TUNNEL RELEASE     CESAREAN SECTION     x2   INTERSTIM IMPLANT PLACEMENT     tenze     TUBAL LIGATION      Social History   Socioeconomic History   Marital status: Single    Spouse name: Not on file   Number of children: Not on file   Years of education: Not on file   Highest education level: Not on file  Occupational History   Not on file  Tobacco Use   Smoking status: Former   Smokeless tobacco: Never  Vaping Use   Vaping status: Never Used  Substance and Sexual Activity   Alcohol use: Not Currently    Comment: occasionally   Drug use: Never   Sexual activity: Not on file  Other Topics Concern   Not on file  Social History Narrative   Not on file   Social Determinants of Health   Financial Resource Strain: Not on file  Food Insecurity: No Food Insecurity (02/04/2022)   Received from [facility], [facility]   Hunger Vital Sign    Worried About Running Out of Food in the Last Year: Never true    Ran Out of Food in the Last Year: Never true  Transportation Needs: Not on file  Physical Activity: Not on file  Stress: No Stress Concern Present (03/07/2022)   Received from [facility], [facility]   [employer] [facility] - Occupational Stress Questionnaire    Feeling of Stress : Not at all  Social Connections: Unknown (02/17/2022)   Received from [facility], [facility]   Social Network    Social Network: Not on file  Intimate Partner Violence: Unknown (01/20/2022)   Received from [facility], [facility]   HITS    Physically Hurt: Not on file    Insult or Talk Down To: Not on file    Threaten Physical Harm: Not on file    Scream or Curse: Not on file    Immunization History  Administered Date(s) Administered   Influenza Split 08/05/2012, 12/28/2015, 07/24/2016   Influenza, Seasonal, Injecte, Preservative Fre 08/05/2012, 08/04/2014   Influenza,inj,Quad PF,6+ Mos 09/04/2020    Influenza-Unspecified 08/05/2012, 08/04/2014, 12/28/2015, 07/24/2016   Rho (D) Immune Globulin 05/05/2016   Tdap 05/15/2016, 05/15/2016    Family History  Problem Relation Age of Onset   Hypertension Mother    Kidney failure Mother    Hypertension Father    Clotting disorder Father    Diabetes Father    Deep vein thrombosis Father    Stroke Maternal Grandmother    Prostate cancer Maternal Grandfather    Stroke Paternal Grandfather    Colon cancer Paternal Grandfather    Breast cancer Maternal Aunt    Factor V Leiden deficiency Maternal Aunt    Deep vein thrombosis Paternal Aunt  Breast cancer Maternal Great-grandmother      Current Outpatient Medications:    amLODipine (NORVASC) 5 MG tablet, Take 5 mg by mouth daily., Disp: , Rfl:    chlorthalidone (HYGROTON) 25 MG tablet, Take 25 mg by mouth daily., Disp: , Rfl:    clonazePAM (KLONOPIN) 0.5 MG tablet, Take 1 tablet (0.5 mg total) by mouth daily as needed for anxiety. USE VERY SPARINGLY (Patient not taking: Reported on 07/02/2023), Disp: 20 tablet, Rfl: 0   Drospirenone (SLYND) 4 MG TABS, Take 1 tablet (4 mg total) by mouth daily., Disp: 84 each, Rfl: 3   meloxicam (MOBIC) 15 MG tablet, ONE TABLET EVERY MORNING WITH A MEAL FOR 2 WEEKS, THEN DAILY AS NEEDED FOR PAIN., Disp: 90 tablet, Rfl: 1   ondansetron (ZOFRAN-ODT) 8 MG disintegrating tablet, Take 1 tablet (8 mg total) by mouth every 8 (eight) hours as needed for nausea., Disp: 20 tablet, Rfl: 3   pantoprazole (PROTONIX) 40 MG tablet, TAKE 1 TABLET BY MOUTH EVERY DAY, Disp: 90 tablet, Rfl: 0   rivaroxaban (XARELTO) 10 MG TABS tablet, Take 1 tablet (10 mg total) by mouth daily., Disp: 30 tablet, Rfl: 9   topiramate (TOPAMAX) 200 MG tablet, TAKE 1 TABLET BY MOUTH EVERY DAY, Disp: 90 tablet, Rfl: 1   venlafaxine XR (EFFEXOR XR) 75 MG 24 hr capsule, Take 1 capsule (75 mg total) by mouth daily with breakfast., Disp: 90 capsule, Rfl: 1   WEGOVY 2.4 MG/0.75ML SOAJ, Inject 2.4 mg into  the skin once a week., Disp: 9 mL, Rfl: 3  Physical exam: Exam limited due to telemedicine  Assessment and plan- Patient is a 42 y.o. female with history of DVT, currently on oral OCP who was found to have an elevated homocystine level.   At this time she would not like to stop her OCP. She will discuss this further with her prescribing physician. We have discussed the risks of DVT/stroke/MI, etc. At this time I have recommended that she stop her asa and instead start Xarelto for DVT prophylaxis while we further evaluate her elevated homocysteine level. We reviewed extensively the risks and common side effects of xarelto. We also discuss that should she have a head, joint or abdominal injury while on this medication she will need to seek immediate medical attention. We also discussed bleeding precautions. She is aware that her concurrent meloxicam use may cause additional bleeding risk. At the moment risk of bleeding appears lower than risk of clotting.   We will see her again in 2 weeks for further lab evaluation of the elevated homocystine level. If this is again elevated with normal folate/B vitamin levels she will likely need to stay anticoagulated lifelong.   Once we have all of this information we can better direct her on her upcoming surgery.    Visit Diagnosis 1. Elevated homocysteine   2. Polycythemia     Patient expressed understanding and was in agreement with this plan. She also understands that She can call clinic at any time with any questions, concerns, or complaints.   I discussed the assessment and treatment plan with the patient. The patient was provided an opportunity to ask questions and all were answered. The patient agreed with the plan and demonstrated an understanding of the instructions.   The patient was advised to call back or seek an in-person evaluation if the symptoms worsen or if the condition fails to improve as anticipated.    I spent 10 minutes on this  telephone encounter.  Video connection was lost at >50% of the duration of the visit, at which time the remainder of the visit was completed via audio only.  Thank you for allowing me to participate in the care of this very pleasant patient.   Clent Jacks PA-C

## 2023-07-21 LAB — HEMOCHROMATOSIS DNA-PCR(C282Y,H63D)

## 2023-07-22 ENCOUNTER — Ambulatory Visit: Payer: BC Managed Care – PPO | Admitting: Medical-Surgical

## 2023-07-22 ENCOUNTER — Encounter: Payer: Self-pay | Admitting: Medical-Surgical

## 2023-07-22 VITALS — BP 108/74 | HR 79 | Resp 20 | Ht 65.0 in | Wt 174.7 lb

## 2023-07-22 DIAGNOSIS — F411 Generalized anxiety disorder: Secondary | ICD-10-CM

## 2023-07-22 NOTE — Progress Notes (Signed)
        Established patient visit  History, exam, impression, and plan:  1. GAD (generalized anxiety disorder) Pleasant 42 year old female presenting today for follow-up on general anxiety disorder.  She has been taking Effexor 75 mg daily, tolerating well without side effects.  Reports that she is not sure that she has noticed a difference with the increase from the lower dose of the Effexor.  Admits that she has quite a few stressors including work and health issues.  She is being followed by OB/GYN as well as hematology.  Has an upcoming procedure for hysteroscopy/D&C.  Has continued with daily vaginal bleeding over the last 2 weeks which is understandably frustrating.  Notes that most of her stressors are situational and she does not feel that medication is going to make a difference at this time.  Discussed adjusting her Effexor dose but she prefers to hold off.  Denies SI/HI.  For now, continue Effexor 75 mg daily.  If desired, we can certainly revisit dosing and/or medication choice.   Procedures performed this visit: None.  Return in about 6 months (around 01/20/2024) for chronic disease follow up.  __________________________________ Thayer Ohm, DNP, APRN, FNP-BC Primary Care and Sports Medicine [facility] [place]

## 2023-07-23 ENCOUNTER — Encounter: Payer: Self-pay | Admitting: Medical Oncology

## 2023-07-23 ENCOUNTER — Other Ambulatory Visit: Payer: Self-pay | Admitting: Obstetrics and Gynecology

## 2023-07-23 ENCOUNTER — Inpatient Hospital Stay: Payer: BC Managed Care – PPO | Attending: Medical Oncology

## 2023-07-23 ENCOUNTER — Inpatient Hospital Stay: Payer: BC Managed Care – PPO | Admitting: Medical Oncology

## 2023-07-23 DIAGNOSIS — Z01818 Encounter for other preprocedural examination: Secondary | ICD-10-CM

## 2023-07-23 NOTE — Progress Notes (Signed)
Pre op orders entered

## 2023-07-24 ENCOUNTER — Other Ambulatory Visit: Payer: Self-pay | Admitting: Medical Oncology

## 2023-07-31 ENCOUNTER — Encounter: Payer: Self-pay | Admitting: Obstetrics and Gynecology

## 2023-07-31 DIAGNOSIS — N939 Abnormal uterine and vaginal bleeding, unspecified: Secondary | ICD-10-CM

## 2023-08-01 MED ORDER — MEDROXYPROGESTERONE ACETATE 10 MG PO TABS
10.0000 mg | ORAL_TABLET | Freq: Every day | ORAL | 0 refills | Status: DC
Start: 2023-08-01 — End: 2023-08-14

## 2023-08-13 ENCOUNTER — Encounter (HOSPITAL_BASED_OUTPATIENT_CLINIC_OR_DEPARTMENT_OTHER): Payer: Self-pay | Admitting: Obstetrics and Gynecology

## 2023-08-14 ENCOUNTER — Encounter (HOSPITAL_BASED_OUTPATIENT_CLINIC_OR_DEPARTMENT_OTHER): Payer: Self-pay | Admitting: Obstetrics and Gynecology

## 2023-08-14 NOTE — Progress Notes (Signed)
Spoke w/ via phone for pre-op interview--- pt Lab needs dos----  t&s, urine preg, istat       Lab results------ current  EKG in epic/ chart COVID test -----[name] states asymptomatic no test needed Arrive at ------- 1230 on 08-19-2023 NPO after MN NO Solid Food.  Clear liquids from MN until--- 1130 Med rec completed Medications to take morning of surgery ----- none.  Pt stated last dose wegovy 08-05-2023 aware not to do again until after surgery, was given instructions by Dr Berton Lan per pt. Diabetic medication ----- n/a [name] instructed no nail polish to be worn day of surgery [name] instructed to bring photo id and insurance card day of surgery [name] aware to have Driver (ride ) / caregiver    for 24 hours after surgery - mother, Linda [name] Special Instructions ----- n/a Pre-Op special Instructions ----- sent dr Roma Kayser inbox message in epic inquiring what she wants pt to do,  pt takes ASA 81 mg due to hx DVTs was not given instructions what to do prior to surgery.  Pt stated she had stated the xarelto due to her surgery coming up. [name] verbalized understanding of instructions that were given at this phone interview. [name] denies chest pain, sob, fever, cough at the interview.

## 2023-08-19 ENCOUNTER — Other Ambulatory Visit: Payer: Self-pay | Admitting: Medical-Surgical

## 2023-08-19 ENCOUNTER — Ambulatory Visit (HOSPITAL_BASED_OUTPATIENT_CLINIC_OR_DEPARTMENT_OTHER): Payer: Self-pay | Admitting: Anesthesiology

## 2023-08-19 ENCOUNTER — Encounter (HOSPITAL_BASED_OUTPATIENT_CLINIC_OR_DEPARTMENT_OTHER)
Admission: RE | Disposition: A | Discharge status: Home / Self Care | Payer: Self-pay | Source: Home / Self Care | Attending: Obstetrics and Gynecology

## 2023-08-19 ENCOUNTER — Ambulatory Visit (HOSPITAL_BASED_OUTPATIENT_CLINIC_OR_DEPARTMENT_OTHER)
Admission: RE | Admit: 2023-08-19 | Discharge: 2023-08-19 | Disposition: A | Discharge status: Home / Self Care | Payer: Self-pay | Attending: Obstetrics and Gynecology | Admitting: Obstetrics and Gynecology

## 2023-08-19 ENCOUNTER — Encounter (HOSPITAL_BASED_OUTPATIENT_CLINIC_OR_DEPARTMENT_OTHER): Payer: Self-pay | Admitting: Obstetrics and Gynecology

## 2023-08-19 ENCOUNTER — Other Ambulatory Visit: Payer: Self-pay

## 2023-08-19 DIAGNOSIS — R569 Unspecified convulsions: Secondary | ICD-10-CM | POA: Insufficient documentation

## 2023-08-19 DIAGNOSIS — Z7982 Long term (current) use of aspirin: Secondary | ICD-10-CM | POA: Insufficient documentation

## 2023-08-19 DIAGNOSIS — N840 Polyp of corpus uteri: Secondary | ICD-10-CM | POA: Insufficient documentation

## 2023-08-19 DIAGNOSIS — Z7901 Long term (current) use of anticoagulants: Secondary | ICD-10-CM | POA: Insufficient documentation

## 2023-08-19 DIAGNOSIS — R519 Headache, unspecified: Secondary | ICD-10-CM | POA: Insufficient documentation

## 2023-08-19 DIAGNOSIS — Z79899 Other long term (current) drug therapy: Secondary | ICD-10-CM | POA: Insufficient documentation

## 2023-08-19 DIAGNOSIS — I10 Essential (primary) hypertension: Secondary | ICD-10-CM | POA: Insufficient documentation

## 2023-08-19 DIAGNOSIS — M797 Fibromyalgia: Secondary | ICD-10-CM | POA: Insufficient documentation

## 2023-08-19 DIAGNOSIS — Z01818 Encounter for other preprocedural examination: Secondary | ICD-10-CM

## 2023-08-19 DIAGNOSIS — E782 Mixed hyperlipidemia: Secondary | ICD-10-CM | POA: Insufficient documentation

## 2023-08-19 DIAGNOSIS — F32A Depression, unspecified: Secondary | ICD-10-CM | POA: Insufficient documentation

## 2023-08-19 DIAGNOSIS — N938 Other specified abnormal uterine and vaginal bleeding: Secondary | ICD-10-CM

## 2023-08-19 DIAGNOSIS — K219 Gastro-esophageal reflux disease without esophagitis: Secondary | ICD-10-CM | POA: Insufficient documentation

## 2023-08-19 DIAGNOSIS — N939 Abnormal uterine and vaginal bleeding, unspecified: Secondary | ICD-10-CM | POA: Insufficient documentation

## 2023-08-19 DIAGNOSIS — Z86718 Personal history of other venous thrombosis and embolism: Secondary | ICD-10-CM | POA: Insufficient documentation

## 2023-08-19 DIAGNOSIS — Z8673 Personal history of transient ischemic attack (TIA), and cerebral infarction without residual deficits: Secondary | ICD-10-CM | POA: Insufficient documentation

## 2023-08-19 DIAGNOSIS — Z87891 Personal history of nicotine dependence: Secondary | ICD-10-CM | POA: Insufficient documentation

## 2023-08-19 DIAGNOSIS — F419 Anxiety disorder, unspecified: Secondary | ICD-10-CM | POA: Insufficient documentation

## 2023-08-19 HISTORY — DX: Personal history of other venous thrombosis and embolism: Z86.718

## 2023-08-19 HISTORY — DX: Fibromyalgia: M79.7

## 2023-08-19 HISTORY — DX: Mixed hyperlipidemia: E78.2

## 2023-08-19 HISTORY — DX: Gastro-esophageal reflux disease without esophagitis: K21.9

## 2023-08-19 HISTORY — DX: Presence of spectacles and contact lenses: Z97.3

## 2023-08-19 HISTORY — DX: Personal history of other diseases of the female genital tract: Z87.42

## 2023-08-19 HISTORY — DX: Secondary polycythemia: D75.1

## 2023-08-19 HISTORY — DX: Abnormal uterine and vaginal bleeding, unspecified: N93.9

## 2023-08-19 HISTORY — DX: Generalized anxiety disorder: F41.1

## 2023-08-19 HISTORY — PX: HYSTEROSCOPY WITH D & C: SHX1775

## 2023-08-19 HISTORY — DX: Personal history of other endocrine, nutritional and metabolic disease: Z86.39

## 2023-08-19 HISTORY — DX: Other specified abnormal findings of blood chemistry: R79.89

## 2023-08-19 LAB — POCT I-STAT, CHEM 8
BUN: 14 mg/dL (ref 6–20)
Calcium, Ion: 1.3 mmol/L (ref 1.15–1.40)
Chloride: 102 mmol/L (ref 98–111)
Creatinine, Ser: 0.9 mg/dL (ref 0.44–1.00)
Glucose, Bld: 92 mg/dL (ref 70–99)
HCT: 48 % — ABNORMAL HIGH (ref 36.0–46.0)
Hemoglobin: 16.3 g/dL — ABNORMAL HIGH (ref 12.0–15.0)
Potassium: 3.2 mmol/L — ABNORMAL LOW (ref 3.5–5.1)
Sodium: 140 mmol/L (ref 135–145)
TCO2: 21 mmol/L — ABNORMAL LOW (ref 22–32)

## 2023-08-19 LAB — TYPE AND SCREEN
ABO/RH(D): B NEG
Antibody Screen: NEGATIVE

## 2023-08-19 LAB — POCT PREGNANCY, URINE: Preg Test, Ur: NEGATIVE

## 2023-08-19 SURGERY — DILATATION AND CURETTAGE /HYSTEROSCOPY
Anesthesia: General | Site: Vagina

## 2023-08-19 MED ORDER — ONDANSETRON HCL 4 MG/2ML IJ SOLN
INTRAMUSCULAR | Status: DC | PRN
  Administered 2023-08-19: 4 mg via INTRAVENOUS

## 2023-08-19 MED ORDER — FENTANYL CITRATE (PF) 100 MCG/2ML IJ SOLN
25.0000 ug | INTRAMUSCULAR | Status: DC | PRN
Start: 2023-08-19 — End: 2023-08-19
  Administered 2023-08-19: 25 ug via INTRAVENOUS

## 2023-08-19 MED ORDER — SODIUM CHLORIDE 0.9 % IV SOLN: INTRAVENOUS | Status: DC | PRN

## 2023-08-19 MED ORDER — FENTANYL CITRATE (PF) 100 MCG/2ML IJ SOLN
INTRAMUSCULAR | Status: AC
  Filled 2023-08-19: qty 2

## 2023-08-19 MED ORDER — PROPOFOL 10 MG/ML IV BOLUS
INTRAVENOUS | Status: DC | PRN
  Administered 2023-08-19: 200 mg via INTRAVENOUS

## 2023-08-19 MED ORDER — LIDOCAINE 2% (20 MG/ML) 5 ML SYRINGE
INTRAMUSCULAR | Status: DC | PRN
  Administered 2023-08-19: 40 mg via INTRAVENOUS

## 2023-08-19 MED ORDER — DEXAMETHASONE SODIUM PHOSPHATE 10 MG/ML IJ SOLN
INTRAMUSCULAR | Status: DC | PRN
  Administered 2023-08-19: 10 mg via INTRAVENOUS

## 2023-08-19 MED ORDER — KETOROLAC TROMETHAMINE 30 MG/ML IJ SOLN
INTRAMUSCULAR | Status: AC
Start: 2023-08-19 — End: ?
  Filled 2023-08-19: qty 1

## 2023-08-19 MED ORDER — LIDOCAINE HCL (PF) 2 % IJ SOLN
INTRAMUSCULAR | Status: AC
  Filled 2023-08-19: qty 5

## 2023-08-19 MED ORDER — SODIUM CHLORIDE 0.9 % IR SOLN
Status: DC | PRN
  Administered 2023-08-19: 3000 mL

## 2023-08-19 MED ORDER — SILVER NITRATE-POT NITRATE 75-25 % EX MISC
CUTANEOUS | Status: DC | PRN
  Administered 2023-08-19: 1

## 2023-08-19 MED ORDER — PROPOFOL 10 MG/ML IV BOLUS
INTRAVENOUS | Status: AC
  Filled 2023-08-19: qty 20

## 2023-08-19 MED ORDER — MIDAZOLAM HCL 5 MG/5ML IJ SOLN
INTRAMUSCULAR | Status: DC | PRN
  Administered 2023-08-19: 2 mg via INTRAVENOUS

## 2023-08-19 MED ORDER — FENTANYL CITRATE (PF) 100 MCG/2ML IJ SOLN
INTRAMUSCULAR | Status: DC | PRN
  Administered 2023-08-19 (×2): 50 ug via INTRAVENOUS

## 2023-08-19 MED ORDER — DEXAMETHASONE SODIUM PHOSPHATE 10 MG/ML IJ SOLN
INTRAMUSCULAR | Status: AC
Start: 2023-08-19 — End: ?
  Filled 2023-08-19: qty 1

## 2023-08-19 MED ORDER — LACTATED RINGERS IV SOLN: INTRAVENOUS | Status: DC

## 2023-08-19 MED ORDER — KETOROLAC TROMETHAMINE 30 MG/ML IJ SOLN
INTRAMUSCULAR | Status: DC | PRN
  Administered 2023-08-19: 30 mg via INTRAVENOUS

## 2023-08-19 MED ORDER — ACETAMINOPHEN 10 MG/ML IV SOLN: 1000.0000 mg | Freq: Once | INTRAVENOUS | Status: DC | PRN

## 2023-08-19 MED ORDER — ONDANSETRON HCL 4 MG/2ML IJ SOLN
INTRAMUSCULAR | Status: AC
  Filled 2023-08-19: qty 2

## 2023-08-19 MED ORDER — MIDAZOLAM HCL 2 MG/2ML IJ SOLN
INTRAMUSCULAR | Status: AC
  Filled 2023-08-19: qty 2

## 2023-08-19 MED ORDER — DROPERIDOL 2.5 MG/ML IJ SOLN: 0.6250 mg | Freq: Once | INTRAMUSCULAR | Status: DC | PRN

## 2023-08-19 SURGICAL SUPPLY — 18 items
CATH ROBINSON RED A/P 16FR (CATHETERS) IMPLANT
CURETTE PIPELLE ENDOMTRL SUCTN (MISCELLANEOUS) IMPLANT
DEVICE MYOSURE LITE (MISCELLANEOUS) IMPLANT
DEVICE MYOSURE REACH (MISCELLANEOUS) IMPLANT
DILATOR CANAL MILEX (MISCELLANEOUS) IMPLANT
GLOVE BIO SURGEON STRL SZ7 (GLOVE) ×1 IMPLANT
GLOVE BIOGEL PI IND STRL 7.0 (GLOVE) ×1 IMPLANT
GOWN STRL REUS W/TWL XL LVL3 (GOWN DISPOSABLE) ×2 IMPLANT
KIT PROCEDURE FLUENT (KITS) ×1 IMPLANT
KIT TURNOVER CYSTO (KITS) ×1 IMPLANT
MYOSURE XL FIBROID (MISCELLANEOUS)
PACK VAGINAL MINOR WOMEN LF (CUSTOM PROCEDURE TRAY) ×1 IMPLANT
PAD OB MATERNITY 4.3X12.25 (PERSONAL CARE ITEMS) ×1 IMPLANT
PIPELLE ENDOMETRIAL SUCTION CU (MISCELLANEOUS)
SEAL ROD LENS SCOPE MYOSURE (ABLATOR) ×1 IMPLANT
SLEEVE SCD COMPRESS KNEE MED (STOCKING) ×1 IMPLANT
SYSTEM TISS REMOVAL MYOSURE XL (MISCELLANEOUS) IMPLANT
TOWEL OR 17X24 6PK STRL BLUE (TOWEL DISPOSABLE) ×1 IMPLANT

## 2023-08-19 NOTE — Anesthesia Procedure Notes (Signed)
Procedure Name: LMA Insertion Date/Time: 08/19/2023 2:32 PM  Performed by: Briant Sites, CRNAPre-anesthesia Checklist: Patient identified, Emergency Drugs available, Suction available and Patient being monitored Patient Re-evaluated:Patient Re-evaluated prior to induction Oxygen Delivery Method: Circle system utilized Preoxygenation: Pre-oxygenation with 100% oxygen Induction Type: IV induction Ventilation: Mask ventilation without difficulty LMA: LMA inserted LMA Size: 4.0 Number of attempts: 1 Airway Equipment and Method: Bite block Placement Confirmation: positive ETCO2 Tube secured with: Tape Dental Injury: Teeth and Oropharynx as per pre-operative assessment

## 2023-08-19 NOTE — Telephone Encounter (Signed)
Prescribed by a Historic Provider

## 2023-08-19 NOTE — Op Note (Addendum)
08/19/23 2:53 PM  Preoperative Diagnosis: AUB Postoperative Diagnosis: Same Procedure: Hysteroscopy, dilation and curettage  Surgeon: Dr. Harvie Bridge Assistant: None EBL 5 cc IVF: 200 cc  UOP: Not collected Deficit: 50cc  Specimens: Endometrial curetting  Findings: Small, mobile, anteverted uterus. Bilateral tubal ostia visualized. Thin, normal appearing endometrium. Normal cervix, minimal descensus  Description of the procedure: Preop antibiotics not indicated. Informed consent reviewed and signed. Pt given opportunity to ask questions.   Pt prepped and draped in the dorsal lithotomy fashion after LMA anesthesia found to be adequate. Timeout performed.   Open sided speculum placed into the patient's vagina. Single tooth tenaculum applied to the 12 o'clock position of the cervix. Uterus sounded to 8cm.  Cervix progressively dilated to a 19Fr. 30 degree hysteroscope was inserted into the cavity with the aforementioned findings noted. EMC performed. EMB Pipelle passed to collect any remaining endometrial fragments.   Hemostatic at the end of the procedure. Procedure completed. All instruments removed. Counts correct x2.  Pt taken to recovery room in stable condition.  Harvie Bridge, MD Obstetrician & Gynecologist, [facility] for [employer], [facility]

## 2023-08-19 NOTE — Anesthesia Preprocedure Evaluation (Signed)
Anesthesia Evaluation  Patient identified by MRN, date of birth, ID band Patient awake    Reviewed: Allergy & Precautions, NPO status , Patient's Chart, lab work & pertinent test results  Airway Mallampati: II  TM Distance: >3 FB Neck ROM: Full    Dental no notable dental hx.    Pulmonary neg pulmonary ROS, former smoker   Pulmonary exam normal        Cardiovascular hypertension, + DVT   Rhythm:Regular Rate:Normal     Neuro/Psych  Headaches, Seizures -, Well Controlled,   Anxiety Depression    TIA   GI/Hepatic Neg liver ROS,GERD  ,,  Endo/Other  negative endocrine ROS    Renal/GU negative Renal ROS  Female GU complaint     Musculoskeletal  (+)  Fibromyalgia -  Abdominal Normal abdominal exam  (+)   Peds  Hematology Lab Results      Component                Value               Date                      WBC                      9.6                 07/02/2023                HGB                      16.3 (H)            08/19/2023                HCT                      48.0 (H)            08/19/2023                MCV                      93.4                07/02/2023                PLT                      316                 07/02/2023              Anesthesia Other Findings   Reproductive/Obstetrics                             Anesthesia Physical Anesthesia Plan  ASA: 3  Anesthesia Plan: General   Post-op Pain Management:    Induction: Intravenous  PONV Risk Score and Plan: 3 and Ondansetron, Dexamethasone, Midazolam and Treatment may vary due to age or medical condition  Airway Management Planned: Mask and LMA  Additional Equipment: None  Intra-op Plan:   Post-operative Plan: Extubation in OR  Informed Consent: I have reviewed the patients History and Physical, chart, labs and discussed the procedure including the risks, benefits and alternatives for the proposed  anesthesia with the patient or  authorized representative who has indicated his/her understanding and acceptance.     Dental advisory given  Plan Discussed with: CRNA  Anesthesia Plan Comments:        Anesthesia Quick Evaluation

## 2023-08-19 NOTE — Discharge Instructions (Addendum)
No ibuprofen, Advil, Aleve, Motrin, ketorolac, meloxicam, naproxen, or other NSAIDS until after 8:45pm today if needed for pain.    Post Anesthesia Home Care Instructions  Activity: Get plenty of rest for the remainder of the day. A responsible individual must stay with you for 24 hours following the procedure.  For the next 24 hours, DO NOT: -Drive a car -Advertising copywriter -Drink alcoholic beverages -Take any medication unless instructed by your physician -Make any legal decisions or sign important papers.  Meals: Start with liquid foods such as gelatin or soup. Progress to regular foods as tolerated. Avoid greasy, spicy, heavy foods. If nausea and/or vomiting occur, drink only clear liquids until the nausea and/or vomiting subsides. Call your physician if vomiting continues.  Special Instructions/Symptoms: Your throat may feel dry or sore from the anesthesia or the breathing tube placed in your throat during surgery. If this causes discomfort, gargle with warm salt water. The discomfort should disappear within 24 hours.  If you had a scopolamine patch placed behind your ear for the management of post- operative nausea and/or vomiting:  1. The medication in the patch is effective for 72 hours, after which it should be removed.  Wrap patch in a tissue and discard in the trash. Wash hands thoroughly with soap and water. 2. You may remove the patch earlier than 72 hours if you experience unpleasant side effects which may include dry mouth, dizziness or visual disturbances. 3. Avoid touching the patch. Wash your hands with soap and water after contact with the patch.

## 2023-08-19 NOTE — Transfer of Care (Signed)
Immediate Anesthesia Transfer of Care Note  Patient: Linda [name]  Procedure(s) Performed: DILATATION AND CURETTAGE /HYSTEROSCOPY (Vagina )  Patient Location: PACU  Anesthesia Type:General  Level of Consciousness: awake, alert , and oriented  Airway & Oxygen Therapy: Patient Spontanous Breathing and Patient connected to nasal cannula oxygen  Post-op Assessment: Report given to RN  Post vital signs: Reviewed and stable  Last Vitals:  Vitals Value Taken Time  BP 133/90 08/19/23 1500  Temp    Pulse 97 08/19/23 1500  Resp 10 08/19/23 1500  SpO2 100 % 08/19/23 1500  Vitals shown include unfiled device data.  Last Pain:  Vitals:   08/19/23 1302  TempSrc: Oral  PainSc: 0-No pain      Patients Stated Pain Goal: 4 (08/19/23 1302)  Complications: No notable events documented.

## 2023-08-19 NOTE — Anesthesia Postprocedure Evaluation (Signed)
Anesthesia Post Note  Patient: Pamler Welle  Procedure(s) Performed: DILATATION AND CURETTAGE /HYSTEROSCOPY (Vagina )     Patient location during evaluation: PACU Anesthesia Type: General Level of consciousness: awake and alert and oriented Pain management: pain level controlled Vital Signs Assessment: post-procedure vital signs reviewed and stable Respiratory status: spontaneous breathing, nonlabored ventilation and respiratory function stable Cardiovascular status: blood pressure returned to baseline and stable Postop Assessment: no apparent nausea or vomiting Anesthetic complications: no   No notable events documented.  Last Vitals:  Vitals:   08/19/23 1500 08/19/23 1515  BP: (!) 133/90 (!) 133/94  Pulse: 97 88  Resp: 10 18  Temp: 37 C   SpO2: 100% 100%    Last Pain:  Vitals:   08/19/23 1515  TempSrc:   PainSc: 4                  Teriann Livingood A.

## 2023-08-19 NOTE — H&P (Signed)
GYNECOLOGY HISTORY AND PHYSICAL  Subjective:  Linda [name] is a 42 y.o. [phone] with AUB presenting for hysteroscopy D&C  Irregular cycles where she skips several months at a time and then ends up with a week long cycle. Recent prolonged bleeding episode in August. I personally reviewed her work up from 05/14/23 including pap NILM/HPV neg, FSH 4.5, estradiol 71.6, PRL 7.8, and pelvic Linda on 7/31 notable for 4 x 3 x 3 echogenic focus concerning for focal endometrial lesion/polyp (overall uterine size 7.7 x 3.9 x 5.8cm).   Pt has since been diagnosed with elevated homocysteine level and is recommended to be on prophylactic dosing of xarelto indefinitely.   PMH: VTE, stroke, migraine w/ aura, HTN PSH: Csx2, BTL, ?ablation vs cryosurgery (20s), transanal resection of rectal mass  Past Medical History:  Diagnosis Date   Abnormal uterine bleeding (AUB)    Depression    Elevated homocysteine    (08-14-2023  per pt taking asa 81mg , not started xarelto yet d/t surgery) hematology/ oncology-- Roseanna Rainbow NP , evaluated d/t hx multiple suspected provoked DVTs , note in epic 07-17-2023 ,  pt changed to xarelto from asa 81mg    Fibromyalgia    GAD (generalized anxiety disorder)    GERD (gastroesophageal reflux disease)    History of abnormal cervical Pap smear    w/ HPV   History of DVT of lower extremity    per pt x2  DVT's  in 2012 both LLE ,  suspected provoked   History of hypokalemia    History of seizures 2015   x1 seizure,, normal CT , no EEG done (pt did not do), on meds for few months, then stopped , was evaluted by [facility] in [place] , no seizure since   History of transient ischemic attack (TIA) 2013   unknown etiology per pt ,  none since   Hyperlipidemia, mixed    Hypertension    IBS (irritable bowel syndrome)    Interstitial cystitis    per pt followed IC diet   Migraine    Polycythemia    Wears contact lenses    Past Surgical History:  Procedure Laterality  Date   CARPAL TUNNEL RELEASE Right 1999   CESAREAN SECTION     x2  2009;   last one @ [facility] 07-25-2016  w/ Bilateral Tubal Ligation   CYSTOSCOPY WITH HYDRODISTENSION AND BIOPSY  01/05/2004   @[facility]  by dr Marye Round; w/ instillation therapy &   Insertion of first stage interstim implant   DILATION AND CURETTAGE OF UTERUS  10/26/2010   @[facility]  ;   w/ suction for blighted ovum   INTERSTIM IMPLANT REMOVAL  02/2009   Dr Sherron Monday   TRANSANAL RECTAL RESECTION  03/04/2022   @[facility];   rectal mass   No current facility-administered medications on file prior to encounter.   Current Outpatient Medications on File Prior to Encounter  Medication Sig Dispense Refill   aspirin EC 81 MG tablet Take 81 mg by mouth at bedtime. Swallow whole.     chlorthalidone (HYGROTON) 25 MG tablet Take 25 mg by mouth at bedtime.     meloxicam (MOBIC) 15 MG tablet ONE TABLET EVERY MORNING WITH A MEAL FOR 2 WEEKS, THEN DAILY AS NEEDED FOR PAIN. (Patient taking differently: Take 15 mg by mouth at bedtime. ONE TABLET EVERY MORNING WITH A MEAL FOR 2 WEEKS, THEN DAILY AS NEEDED FOR PAIN.) 90 tablet 1   ondansetron (ZOFRAN-ODT) 8 MG disintegrating tablet Take 1 tablet (8 mg  total) by mouth every 8 (eight) hours as needed for nausea. 20 tablet 3   pantoprazole (PROTONIX) 40 MG tablet TAKE 1 TABLET BY MOUTH EVERY DAY (Patient taking differently: Take 40 mg by mouth at bedtime.) 90 tablet 0   topiramate (TOPAMAX) 200 MG tablet TAKE 1 TABLET BY MOUTH EVERY DAY (Patient taking differently: Take 200 mg by mouth at bedtime.) 90 tablet 1   venlafaxine XR (EFFEXOR XR) 75 MG 24 hr capsule Take 1 capsule (75 mg total) by mouth daily with breakfast. (Patient taking differently: Take 75 mg by mouth at bedtime.) 90 capsule 1   Drospirenone (SLYND) 4 MG TABS Take 1 tablet (4 mg total) by mouth daily. (Patient not taking: Reported on 08/14/2023) 84 each 3   WEGOVY 2.4 MG/0.75ML SOAJ Inject 2.4 mg into the skin once a week. (Patient taking  differently: Inject 2.4 mg into the skin once a week. wednesday's) 9 mL 3   Allergies  Allergen Reactions   Chocolate Anaphylaxis   Cocoa Butter Anaphylaxis   Iodinated Contrast Media Anaphylaxis   Propoxyphene Itching   Sulfa Antibiotics Other (See Comments)    Yeast infection   Codeine Hives and Itching   Lisinopril Cough   Oxycodone Other (See Comments)    Other reaction(s): Hallucinations   OB History     Gravida  4   Para  2   Term      Preterm      AB  2   Living  2      SAB  2   IAB      Ectopic      Multiple      Live Births             Social History   Socioeconomic History   Marital status: Single    Spouse name: Not on file   Number of children: Not on file   Years of education: Not on file   Highest education level: Not on file  Occupational History   Not on file  Tobacco Use   Smoking status: Former    Types: Cigarettes    Start date: 2009    Quit date: 2003    Years since quitting: 21.8   Smokeless tobacco: Never   Tobacco comments:    08-14-2023  per pt quit smoking age 28 (2009), started age 89 (2003)  Vaping Use   Vaping status: Never Used  Substance and Sexual Activity   Alcohol use: Yes    Comment: occasionally   Drug use: Not Currently    Comment: per pt last used age 32s   Sexual activity: Not on file  Other Topics Concern   Not on file  Social History Narrative   Not on file   Social Determinants of Health   Financial Resource Strain: Not on file  Food Insecurity: No Food Insecurity (02/04/2022)   Received from [facility], [facility]   Hunger Vital Sign    Worried About Running Out of Food in the Last Year: Never true    Ran Out of Food in the Last Year: Never true  Transportation Needs: Not on file  Physical Activity: Not on file  Stress: No Stress Concern Present (03/07/2022)   Received from [facility], [facility]   [employer] [facility] - Occupational Stress Questionnaire     Feeling of Stress : Not at all  Social Connections: Unknown (02/17/2022)   Received from [facility], [facility]   Social Network  Social Network: Not on file  Intimate Partner Violence: Unknown (01/20/2022)   Received from [facility], [facility]   HITS    Physically Hurt: Not on file    Insult or Talk Down To: Not on file    Threaten Physical Harm: Not on file    Scream or Curse: Not on file   Objective:   Vitals:   08/14/23 1224  Weight: 77.1 kg  Height: 5\' 5"  (1.651 m)   General:  Alert, oriented and cooperative. Patient is in no acute distress.  Skin: Skin is warm and dry. No rash noted.   Cardiovascular: Normal heart rate noted  Respiratory: Normal respiratory effort, no problems with respiration noted  Abdomen: Soft, non-tender, non-distended    Assessment and Plan:  Linda [name] is a 42 y.o. with AUB presenting for hysteroscopy D&C  AUB - Diagnosis: AUB - Planned surgery: hysteroscopy D&C  - Risks of surgery include but are not limited to: bleeding, infection, injury to surrounding organs/tissues (i.e. bowel/bladder/ureters), need for additional procedures, wound complications, hospital re-admission, VTE, additional complications: Uterine perforation, fluid overload - We discussed postop restrictions, precautions and expectations - All questions answered - Anticipate same day discharge  Future Appointments  Date Time Provider Department Center  01/20/2024 10:50 AM Christen Butter, NP [facility] None    Lennart Pall, MD

## 2023-08-20 ENCOUNTER — Encounter (HOSPITAL_BASED_OUTPATIENT_CLINIC_OR_DEPARTMENT_OTHER): Payer: Self-pay | Admitting: Obstetrics and Gynecology

## 2023-08-20 NOTE — Anesthesia Postprocedure Evaluation (Signed)
Anesthesia Post Note  Patient: Linda [name]  Procedure(s) Performed: DILATATION AND CURETTAGE /HYSTEROSCOPY (Vagina )     Patient location during evaluation: PACU Anesthesia Type: General Level of consciousness: awake and alert Pain management: pain level controlled Vital Signs Assessment: post-procedure vital signs reviewed and stable Respiratory status: spontaneous breathing, nonlabored ventilation, respiratory function stable and patient connected to nasal cannula oxygen Cardiovascular status: blood pressure returned to baseline and stable Postop Assessment: no apparent nausea or vomiting Anesthetic complications: no   No notable events documented.  Last Vitals:  Vitals:   08/19/23 1530 08/19/23 1541  BP: 123/82 (!) 126/92  Pulse: 91 86  Resp: (!) 22 18  Temp: 36.6 C   SpO2: 100% 100%    Last Pain:  Vitals:   08/20/23 1025  TempSrc:   PainSc: 2                  Nelle Don Linda [name]

## 2023-08-20 NOTE — Telephone Encounter (Signed)
Left message for patient to call back about the medication.

## 2023-08-21 LAB — SURGICAL PATHOLOGY

## 2023-09-03 ENCOUNTER — Encounter: Payer: Self-pay | Admitting: Obstetrics and Gynecology

## 2023-09-03 ENCOUNTER — Ambulatory Visit: Payer: BC Managed Care – PPO | Admitting: Obstetrics and Gynecology

## 2023-09-03 VITALS — BP 131/91 | HR 83 | Ht 65.0 in | Wt 174.0 lb

## 2023-09-03 DIAGNOSIS — Z8673 Personal history of transient ischemic attack (TIA), and cerebral infarction without residual deficits: Secondary | ICD-10-CM | POA: Diagnosis not present

## 2023-09-03 DIAGNOSIS — Z86718 Personal history of other venous thrombosis and embolism: Secondary | ICD-10-CM

## 2023-09-03 DIAGNOSIS — N939 Abnormal uterine and vaginal bleeding, unspecified: Secondary | ICD-10-CM | POA: Diagnosis not present

## 2023-09-03 NOTE — Progress Notes (Signed)
RETURN GYNECOLOGY VISIT  Subjective:  Linda [name] is a 42 y.o. [id number] 2wk s/p hsc D&C presenting for hysterectomy consult  Danysha has history of irregular cycles likely related to menopause transition. She has had irregular heavy and prolonged bleeding. Her workup was notable for [place] w/ possible endometrial polyp and she is now 2 weeks s/p D&C confirming presence of polyp, overall path benign. Her symptoms were able to be controlled with POPs, but unfortunately she has a history of VTE and recent hypercoagulability workup shows elevated homocysteine level. Her hematologist recommends against hormonal medications to treat her bleeding. Natajah strongly desires definitive management of her bleeding.   Currently taking xarelto & wegovy (see med list below)  I personally reviewed the following: - CBC 07/02/23 Hgb 15.5 - Pap 05/14/23 NILM/HPV neg  - Pelvic US 05/20/23 - 7.7 x 3.9 x 5.8cm anteverted uterus, EL 6.25mm with possible focal endometrial lesion, normal bilateral ovaries - Surgical pathology 08/19/23 - benign weakly proliferative endometrium, benign endometrial polyp  PMH: TIA, DVT w/ elevated homocystine level, epilepsy, HTN, dep/anx, IBS/IC PSH: CSx2, cysto, D&C x2, transanal resection of rectal mass OB: CSx2, SABx2 Pap Hx: last pap 2024 normal, history of HPV+ pap 2019  Current Outpatient Medications on File Prior to Visit  Medication Sig Dispense Refill   chlorthalidone (HYGROTON) 25 MG tablet Take 25 mg by mouth at bedtime.     meloxicam (MOBIC) 15 MG tablet ONE TABLET EVERY MORNING WITH A MEAL FOR 2 WEEKS, THEN DAILY AS NEEDED FOR PAIN. (Patient taking differently: Take 15 mg by mouth at bedtime. ONE TABLET EVERY MORNING WITH A MEAL FOR 2 WEEKS, THEN DAILY AS NEEDED FOR PAIN.) 90 tablet 1   ondansetron (ZOFRAN-ODT) 8 MG disintegrating tablet Take 1 tablet (8 mg total) by mouth every 8 (eight) hours as needed for nausea. 20 tablet 3   pantoprazole (PROTONIX) 40 MG tablet TAKE 1 TABLET  BY MOUTH EVERY DAY (Patient taking differently: Take 40 mg by mouth at bedtime.) 90 tablet 0   topiramate (TOPAMAX) 200 MG tablet TAKE 1 TABLET BY MOUTH EVERY DAY (Patient taking differently: Take 200 mg by mouth at bedtime.) 90 tablet 1   venlafaxine XR (EFFEXOR XR) 75 MG 24 hr capsule Take 1 capsule (75 mg total) by mouth daily with breakfast. (Patient taking differently: Take 75 mg by mouth at bedtime.) 90 capsule 1   rivaroxaban (XARELTO) 10 MG TABS tablet Take 1 tablet (10 mg total) by mouth daily. (Patient not taking: Reported on 08/14/2023) 30 tablet 9   WEGOVY 2.4 MG/0.75ML SOAJ Inject 2.4 mg into the skin once a week. (Patient not taking: Reported on 09/03/2023) 9 mL 3   No current facility-administered medications on file prior to visit.   Objective:   Vitals:   09/03/23 1445  Height: 5\' 5"  (1.651 m)   General:  Alert, oriented and cooperative. Patient is in no acute distress.  Skin: Skin is warm and dry. No rash noted.   Cardiovascular: Normal heart rate noted  Respiratory: Normal respiratory effort, no problems with respiration noted  Abdomen: Soft, non-tender, non-distended    Assessment and Plan:  Linda [name] is a 42 y.o. with AUB and history of DVT/TIA on xarelto presenting for hysterectomy consult  AUB - Diagnosis: AUB - Planned surgery: TLH/BS/cysto - Risks of surgery include but are not limited to:  Bleeding - Can bleed enough to need transfusion or need for additional surgeries I.e. conversation to open surgery.  Infection - can develop around incisions  or internally. May require hospitalization Injury to surrounding organs/tissues (i.e. bowel/bladder/ureters) - discussed how each of these is managed I.e. bladder injury requires catheter for 10-14 days after surgery. Reviewed higher risk of bladder injury due to history of 2 prior CS Need for additional procedures - Would be specific to a complication or injury Wound complications or vaginal cuff dehiscence - No  specific wound unless converted to open surgery or laparoscopic Hospital re-admission - In the event of a delayed complication being recognized I.e. ureteral injury discussed Conversion to open surgery - reviewed some complications may necessitate or it may be the only way to complete the surgery.   VTE - Discussed risk of blood clots following delivery. She is higher risk due to her history and we will work with her PCP/hematologist to develop an [facility] strategy - We discussed alternatives to hysterectomy including IUD and endometrial ablation. She declines these options and strongly prefers definitive management.  - We discussed postop restrictions, precautions and expectations. Would anticipate same day discharge - Preop testing needed:  PCP clearance (message routed to PCP), remainder of testing per anesthesia - All questions answered  Surgery request sent. Pt would like to be scheduled around Christmas time to help with work schedule during her recovery  Future Appointments  Date Time Provider Department Center  09/03/2023  2:50 PM Lennart Pall, MD [facility]  01/20/2024 10:50 AM Christen Butter, NP [facility] None   Lennart Pall, MD

## 2023-09-08 ENCOUNTER — Ambulatory Visit (INDEPENDENT_AMBULATORY_CARE_PROVIDER_SITE_OTHER): Payer: BC Managed Care – PPO | Admitting: Medical-Surgical

## 2023-09-08 ENCOUNTER — Encounter: Payer: Self-pay | Admitting: Medical-Surgical

## 2023-09-08 VITALS — BP 119/81 | HR 81 | Resp 20 | Ht 65.0 in | Wt 177.2 lb

## 2023-09-08 DIAGNOSIS — E663 Overweight: Secondary | ICD-10-CM

## 2023-09-08 DIAGNOSIS — R569 Unspecified convulsions: Secondary | ICD-10-CM | POA: Diagnosis not present

## 2023-09-08 DIAGNOSIS — Z01818 Encounter for other preprocedural examination: Secondary | ICD-10-CM

## 2023-09-08 DIAGNOSIS — R300 Dysuria: Secondary | ICD-10-CM

## 2023-09-08 LAB — POCT URINALYSIS DIP (CLINITEK)
Bilirubin, UA: NEGATIVE
Blood, UA: NEGATIVE
Glucose, UA: NEGATIVE mg/dL
Ketones, POC UA: NEGATIVE mg/dL
Nitrite, UA: POSITIVE — AB
POC PROTEIN,UA: NEGATIVE
Spec Grav, UA: 1.015 (ref 1.010–1.025)
Urobilinogen, UA: 0.2 U/dL
pH, UA: 6.5 (ref 5.0–8.0)

## 2023-09-08 MED ORDER — WEGOVY 2.4 MG/0.75ML ~~LOC~~ SOAJ: 2.4000 mg | SUBCUTANEOUS | 3 refills | Status: DC

## 2023-09-08 MED ORDER — ONDANSETRON 8 MG PO TBDP: 8.0000 mg | ORAL_TABLET | Freq: Three times a day (TID) | ORAL | 3 refills | Status: DC | PRN

## 2023-09-08 MED ORDER — PHENAZOPYRIDINE HCL 200 MG PO TABS
200.0000 mg | ORAL_TABLET | Freq: Three times a day (TID) | ORAL | 0 refills | Status: DC | PRN
Start: 2023-09-08 — End: 2023-11-26

## 2023-09-08 MED ORDER — NITROFURANTOIN MONOHYD MACRO 100 MG PO CAPS: 100.0000 mg | ORAL_CAPSULE | Freq: Two times a day (BID) | ORAL | 0 refills | Status: DC

## 2023-09-08 MED ORDER — CHLORTHALIDONE 25 MG PO TABS: 25.0000 mg | ORAL_TABLET | Freq: Every day | ORAL | 1 refills | Status: DC

## 2023-09-08 NOTE — Progress Notes (Signed)
Established patient visit  History, exam, impression, and plan:  1. Preoperative clearance Very pleasant 42 year old female presenting today for preoperative clearance for an upcoming hysterectomy.  She is currently on Xarelto due to high risk of blood clots.  Tolerating the medication well without side effects.  She is also on Wegovy which will need to be stopped a week ahead of time.  Per review of systems and physical exam, there are no concerns at this time.  Her blood pressure is well-controlled and she has no intolerance to regular intentional exercise.  She is cleared for her upcoming procedure with recommendations of stopping Xarelto 2 days prior to the procedure. She can restart Xarelto the day after surgery if post-operative bleeding has stopped.   2. Overweight Has been taking Wegovy 2.4 mg weekly however the pharmacy has not refilled this but she is not sure why.  Uses Zofran as needed for associated nausea with the [facility] injections.  Remains physically active and continues with dietary modifications.  On review of prescription information, Reginal Lutes was sent with refills to last through February.  Resending today as we may need a prior authorization.  Refilling Zofran. - ondansetron (ZOFRAN-ODT) 8 MG disintegrating tablet; Take 1 tablet (8 mg total) by mouth every 8 (eight) hours as needed for nausea.  Dispense: 20 tablet; Refill: 3  3. Seizure (HCC) History of seizure when she was 31 but has not had any issues since then.  Symptoms stable.  4. Dysuria Has had several days of dysuria including frequency, urgency, burning, suprapubic pain, fever, and low back pain.  Saw a little bit of blood this morning when urinating.  POCT urinalysis positive for small leukocytes and nitrates.  Sending for culture.  Treating with Macrobid 100 mg twice daily x 5 days.  Adding Pyridium 200 mg 3 times daily as needed.  Patient advised that we may need to switch antibiotics depending on the culture  results. - POCT Urinalysis Dipstick   Review of Systems  Constitutional:  Positive for chills, fever and malaise/fatigue.  Respiratory:  Negative for cough, shortness of breath and wheezing.   Cardiovascular:  Negative for chest pain, palpitations and leg swelling.  Gastrointestinal:  Positive for nausea.  Genitourinary:  Positive for dysuria, frequency, hematuria and urgency.  Psychiatric/Behavioral:  Negative for suicidal ideas.    Physical Exam Vitals reviewed.  Constitutional:      General: She is not in acute distress.    Appearance: Normal appearance. She is not ill-appearing.  HENT:     Head: Normocephalic and atraumatic.  Cardiovascular:     Rate and Rhythm: Normal rate and regular rhythm.     Pulses: Normal pulses.     Heart sounds: Normal heart sounds. No murmur heard.    No friction rub. No gallop.  Pulmonary:     Effort: Pulmonary effort is normal. No respiratory distress.     Breath sounds: Normal breath sounds. No wheezing.  Skin:    General: Skin is warm and dry.  Neurological:     Mental Status: She is alert and oriented to person, place, and time.  Psychiatric:        Mood and Affect: Mood normal.        Behavior: Behavior normal.        Thought Content: Thought content normal.        Judgment: Judgment normal.    Procedures performed this visit: None.  Return in about 6 months (around 03/07/2024) for  chronic disease follow up.  __________________________________ Thayer Ohm, DNP, APRN, FNP-BC Primary Care and Sports Medicine [facility] [place]

## 2023-09-08 NOTE — Patient Instructions (Signed)
Stop Xarelto 2 days prior to surgery. 

## 2023-09-09 ENCOUNTER — Encounter: Payer: Self-pay | Admitting: Medical-Surgical

## 2023-09-09 MED ORDER — FLUCONAZOLE 150 MG PO TABS: 150.0000 mg | ORAL_TABLET | Freq: Once | ORAL | 0 refills | Status: AC

## 2023-09-10 ENCOUNTER — Other Ambulatory Visit: Payer: Self-pay | Admitting: Medical-Surgical

## 2023-09-10 DIAGNOSIS — E66812 Obesity, class 2: Secondary | ICD-10-CM

## 2023-09-10 DIAGNOSIS — R52 Pain, unspecified: Secondary | ICD-10-CM

## 2023-09-10 DIAGNOSIS — G43109 Migraine with aura, not intractable, without status migrainosus: Secondary | ICD-10-CM

## 2023-09-13 LAB — URINE CULTURE

## 2023-09-14 ENCOUNTER — Other Ambulatory Visit: Payer: Self-pay | Admitting: Medical-Surgical

## 2023-09-14 NOTE — Telephone Encounter (Signed)
Yes we need to collect another urine sample. Ok to double book me if needed.

## 2023-09-15 ENCOUNTER — Ambulatory Visit: Payer: BC Managed Care – PPO

## 2023-09-15 NOTE — Telephone Encounter (Signed)
Patient scheduled.

## 2023-09-19 ENCOUNTER — Encounter: Payer: Self-pay | Admitting: Obstetrics and Gynecology

## 2023-09-21 ENCOUNTER — Other Ambulatory Visit: Payer: Self-pay | Admitting: Obstetrics and Gynecology

## 2023-09-21 DIAGNOSIS — N939 Abnormal uterine and vaginal bleeding, unspecified: Secondary | ICD-10-CM

## 2023-10-05 ENCOUNTER — Encounter: Payer: Self-pay | Admitting: Medical-Surgical

## 2023-10-05 NOTE — Telephone Encounter (Signed)
Patient scheduled.

## 2023-10-06 ENCOUNTER — Ambulatory Visit: Payer: BC Managed Care – PPO | Admitting: Medical-Surgical

## 2023-10-06 ENCOUNTER — Encounter: Payer: Self-pay | Admitting: Medical-Surgical

## 2023-10-06 VITALS — BP 122/78 | HR 79 | Resp 20 | Ht 65.0 in | Wt 175.7 lb

## 2023-10-06 DIAGNOSIS — R300 Dysuria: Secondary | ICD-10-CM

## 2023-10-06 LAB — POCT URINALYSIS DIP (CLINITEK)
Blood, UA: NEGATIVE
Glucose, UA: NEGATIVE mg/dL
Leukocytes, UA: NEGATIVE
Nitrite, UA: NEGATIVE
POC PROTEIN,UA: NEGATIVE
Spec Grav, UA: 1.02 (ref 1.010–1.025)
Urobilinogen, UA: 2 U/dL — AB
pH, UA: 7 (ref 5.0–8.0)

## 2023-10-06 MED ORDER — NITROFURANTOIN MONOHYD MACRO 100 MG PO CAPS: 100.0000 mg | ORAL_CAPSULE | Freq: Two times a day (BID) | ORAL | 0 refills | Status: DC

## 2023-10-06 MED ORDER — FLUCONAZOLE 150 MG PO TABS: 150.0000 mg | ORAL_TABLET | Freq: Once | ORAL | 0 refills | Status: AC

## 2023-10-06 NOTE — Progress Notes (Signed)
        Established patient visit  History, exam, impression, and plan:  1. Dysuria (Primary) Pleasant 42 year old female presenting for evaluation of dysuria.  Notes her symptoms started last week and she has been experiencing pain with urination, burning, frequency, and urgency.  No fever, chills, nausea, vomiting, or flank pain.  Had a remainder of her prescription of Macrobid at home and has taken 3 days of the antibiotic.  Her last dose was yesterday morning.  POCT UA negative for nitrites and leukocytes likely in the setting of antibiotic use.  Today for culture.  She has upcoming surgery so we will go ahead and treat empirically with Macrobid twice daily for a full 5 days.  Diflucan sent for VVC prophylaxis. - POCT URINALYSIS DIP (CLINITEK) - nitrofurantoin, macrocrystal-monohydrate, (MACROBID) 100 MG capsule; Take 1 capsule (100 mg total) by mouth 2 (two) times daily.  Dispense: 10 capsule; Refill: 0 - Urine Culture   Procedures performed this visit: None.  Return if symptoms worsen or fail to improve.  __________________________________ Thayer Ohm, DNP, APRN, FNP-BC Primary Care and Sports Medicine [facility] [place]

## 2023-10-07 ENCOUNTER — Encounter (HOSPITAL_BASED_OUTPATIENT_CLINIC_OR_DEPARTMENT_OTHER): Payer: Self-pay | Admitting: Obstetrics and Gynecology

## 2023-10-07 NOTE — Progress Notes (Signed)
Spoke w/ via phone for pre-op interview--- pt Lab needs dos----   urine preg      Lab results------ lab appt 10-08-2023 @ 0900 getting CBC/ BMP/ T&S/ EKG COVID test -----patient states asymptomatic no test needed Arrive at -------  0730 on 10-08-2023 NPO after MN NO Solid Food.  Clear liquids from MN until--- 0630 Med rec completed Medications to take morning of surgery ----- none Diabetic medication -----  n/a Patient instructed no nail polish to be worn day of surgery Patient instructed to bring photo id and insurance card day of surgery Patient aware to have Driver (ride ) / caregiver    for 24 hours after surgery -  mother, dena Patient Special Instructions ----- will pick up bag with hibiclens and written instructions at lab appt Pre-Op special Instructions -----  n/a Patient verbalized understanding of instructions that were given at this phone interview. Patient denies chest pain, sob, fever, cough at the interview.   Pt's has pcp , Christen Butter NP, surgical and xarelto clearance dated 09-08-2023 in epic and chart.  Pt stated will have last dose xarelto 10-10-2023.

## 2023-10-07 NOTE — Progress Notes (Signed)
Your procedure is scheduled on :  Monday ,  10-12-2023  Report to [facility]  AT  __7:30 _ AM.   Call this number if you have problems the morning of surgery  :[phone]. Any questions prior to surgery call pre-op nurse, Shavonna Corella :  [phone]   OUR ADDRESS IS [address].  WE ARE LOCATED IN [facility]  PLEASE BRING YOUR INSURANCE CARD AND PHOTO ID DAY OF SURGERY.                                     REMEMBER:  Do not eat food after midnight night before surgery.  You may have clear liquid diet from midnight night before surgery until 6:30 AM.  NO clear liquids after 6:30 AM day of surgery .  This includes no water,  candy/ gum/ mints.   Please brush your teeth morning of surgery and rinse mouth out.   CLEAR LIQUID DIET Allowed      Water                                                                   Coffee and tea, regular and decaf  (NO cream or milk products of any type, may sweeten, no honey)                         Carbonated beverages, regular and diet                                    Sports drinks like Gatorade _____________________________________________________________________     TAKE ONLY THESE MEDICATIONS MORNING OF SURGERY:  NONE Last dose of Xarelto Saturday, 10-10-2023 evening                                        DO NOT WEAR JEWERLY/  METAL/  PIERCINGS (INCLUDING NO PLASTIC PIERCINGS) DO NOT WEAR LOTIONS, POWDERS, PERFUMES OR NAIL POLISH ON YOUR FINGERNAILS. TOENAIL POLISH IS OK TO WEAR. DO NOT SHAVE FOR 48 HOURS PRIOR TO DAY OF SURGERY.  CONTACTS, GLASSES, OR DENTURES MAY NOT BE WORN TO SURGERY.  REMEMBER: NO SMOKING, VAPING ,  DRUGS OR ALCOHOL FOR 24 HOURS BEFORE YOUR SURGERY.                                    [facility] IS NOT RESPONSIBLE  FOR ANY BELONGINGS.                                                                    Marland Kitchen           [place] - Preparing for  Surgery Before  surgery, you can play an important role.  Because skin is not sterile, your skin needs to be as free of germs as possible.  You can reduce the number of germs on your skin by washing with CHG (chlorahexidine gluconate) soap before surgery.  CHG is an antiseptic cleaner which kills germs and bonds with the skin to continue killing germs even after washing. Please DO NOT use if you have an allergy to CHG or antibacterial soaps.  If your skin becomes reddened/irritated stop using the CHG and inform your nurse when you arrive at Short Stay. Do not shave (including legs and underarms) for at least 48 hours prior to the first CHG shower.  You may shave your face/neck. Please follow these instructions carefully:  1.  Shower with CHG Soap the night before surgery and the  morning of Surgery.  2.  If you choose to wash your hair, wash your hair first as usual with your  normal  shampoo.  3.  After you shampoo, rinse your hair and body thoroughly to remove the  shampoo.                                        4.  Use CHG as you would any other liquid soap.  You can apply chg directly  to the skin and wash , chg soap provided, night before and morning of your surgery.  5.  Apply the CHG Soap to your body ONLY FROM THE NECK DOWN.   Do not use on face/ open                           Wound or open sores. Avoid contact with eyes, ears mouth and genitals (private parts).                       Wash face,  Genitals (private parts) with your normal soap.             6.  Wash thoroughly, paying special attention to the area where your surgery  will be performed.  7.  Thoroughly rinse your body with warm water from the neck down.  8.  DO NOT shower/wash with your normal soap after using and rinsing off  the CHG Soap.             9.  Pat yourself dry with a clean towel.            10.  Wear clean pajamas.            11.  Place clean sheets on your bed the night of your first shower and do not  sleep with pets. Day of  Surgery : Do not apply any lotions/ powders the morning of surgery.  Please wear clean clothes to the hospital/surgery center.  IF YOU HAVE ANY SKIN IRRITATION OR PROBLEMS WITH THE SURGICAL SOAP, PLEASE GET A BAR OF GOLD DIAL SOAP AND SHOWER THE NIGHT BEFORE YOUR SURGERY AND THE MORNING OF YOUR SURGERY. PLEASE LET THE NURSE KNOW MORNING OF YOUR SURGERY IF YOU HAD ANY PROBLEMS WITH THE SURGICAL SOAP.   YOUR SURGEON MAY HAVE REQUESTED EXTENDED RECOVERY TIME AFTER YOUR SURGERY. IT COULD BE A  JUST A FEW HOURS  UP TO AN OVERNIGHT STAY.  YOUR SURGEON SHOULD HAVE DISCUSSED THIS WITH YOU PRIOR  TO YOUR SURGERY. IN THE EVENT YOU NEED TO STAY OVERNIGHT PLEASE REFER TO THE FOLLOWING GUIDELINES. YOU MAY HAVE UP TO 4 VISITORS  MAY VISIT IN THE EXTENDED RECOVERY ROOM UNTIL 800 PM ONLY.  ONE  VISITOR AGE 11 AND OVER MAY SPEND THE NIGHT AND MUST BE IN EXTENDED RECOVERY ROOM NO LATER THAN 800 PM . YOUR DISCHARGE TIME AFTER YOU SPEND THE NIGHT IS 900 AM THE MORNING AFTER YOUR SURGERY. YOU MAY PACK A SMALL OVERNIGHT BAG WITH TOILETRIES FOR YOUR OVERNIGHT STAY IF YOU WISH.  REGARDLESS OF IF YOU STAY OVER NIGHT OR ARE DISCHARGED THE SAME DAY YOU WILL BE REQUIRED TO HAVE A RESPONSIBLE ADULT (18 YRS OLD OR OLDER) STAY WITH YOU FOR AT LEAST THE FIRST 24 HOURS  YOUR PRESCRIPTION MEDICATIONS WILL BE PROVIDED DURING [facility] STAY.  ________________________________________________________________________

## 2023-10-08 ENCOUNTER — Encounter (HOSPITAL_BASED_OUTPATIENT_CLINIC_OR_DEPARTMENT_OTHER): Payer: Self-pay | Admitting: Obstetrics and Gynecology

## 2023-10-08 ENCOUNTER — Encounter: Payer: Self-pay | Admitting: Medical-Surgical

## 2023-10-08 ENCOUNTER — Encounter (HOSPITAL_COMMUNITY)
Admission: RE | Admit: 2023-10-08 | Discharge: 2023-10-08 | Disposition: A | Discharge status: Home / Self Care | Payer: BC Managed Care – PPO | Source: Ambulatory Visit | Attending: Obstetrics and Gynecology | Admitting: Obstetrics and Gynecology

## 2023-10-08 DIAGNOSIS — Z01818 Encounter for other preprocedural examination: Secondary | ICD-10-CM | POA: Insufficient documentation

## 2023-10-08 DIAGNOSIS — N939 Abnormal uterine and vaginal bleeding, unspecified: Secondary | ICD-10-CM | POA: Insufficient documentation

## 2023-10-08 LAB — BASIC METABOLIC PANEL
Anion gap: 8 (ref 5–15)
BUN: 17 mg/dL (ref 6–20)
CO2: 23 mmol/L (ref 22–32)
Calcium: 9 mg/dL (ref 8.9–10.3)
Chloride: 110 mmol/L (ref 98–111)
Creatinine, Ser: 0.88 mg/dL (ref 0.44–1.00)
GFR, Estimated: >60 mL/min (ref >60)
Glucose, Bld: 83 mg/dL (ref 70–99)
Potassium: 4.1 mmol/L (ref 3.5–5.1)
Sodium: 141 mmol/L (ref 135–145)

## 2023-10-08 LAB — CBC
HCT: 44.7 % (ref 36.0–46.0)
Hemoglobin: 14.7 g/dL (ref 12.0–15.0)
MCH: 32.7 pg (ref 26.0–34.0)
MCHC: 32.9 g/dL (ref 30.0–36.0)
MCV: 99.6 fL (ref 80.0–100.0)
Platelets: 260 10*3/uL (ref 150–400)
RBC: 4.49 MIL/uL (ref 3.87–5.11)
RDW: 11.9 % (ref 11.5–15.5)
WBC: 6.2 10*3/uL (ref 4.0–10.5)
nRBC: 0 % (ref 0.0–0.2)

## 2023-10-08 LAB — URINE CULTURE

## 2023-10-09 MED ORDER — CLOTRIMAZOLE-BETAMETHASONE 1-0.05 % EX CREA: 1.0000 | TOPICAL_CREAM | Freq: Two times a day (BID) | CUTANEOUS | 0 refills | Status: DC

## 2023-10-11 NOTE — Anesthesia Preprocedure Evaluation (Signed)
Anesthesia Evaluation  Patient identified by MRN, date of birth, ID band Patient awake    Reviewed: Allergy & Precautions, NPO status , Patient's Chart, lab work & pertinent test results, reviewed documented beta blocker date and time   Airway Mallampati: II  TM Distance: >3 FB     Dental no notable dental hx. (+) Dental Advisory Given   Pulmonary former smoker   Pulmonary exam normal breath sounds clear to auscultation       Cardiovascular hypertension, Pt. on medications + DVT  Normal cardiovascular exam Rhythm:Regular Rate:Normal     Neuro/Psych  Headaches, Seizures -, Well Controlled,  PSYCHIATRIC DISORDERS Anxiety Depression     Neuromuscular disease    GI/Hepatic Neg liver ROS,GERD  Medicated,,IBS   Endo/Other  GLP-1 RA therapy- Last dose 12/5  Renal/GU negative Renal ROS   Hx/o interstitial cystitis    Musculoskeletal  (+)  Fibromyalgia -  Abdominal   Peds  Hematology Xarelto therapy- has not started   Anesthesia Other Findings   Reproductive/Obstetrics                              Anesthesia Physical Anesthesia Plan  ASA: 3  Anesthesia Plan: General   Post-op Pain Management: Tylenol PO (pre-op)*, Precedex and Dilaudid IV   Induction: Intravenous and Cricoid pressure planned  PONV Risk Score and Plan: 4 or greater and Treatment may vary due to age or medical condition, Midazolam, Ondansetron and Dexamethasone  Airway Management Planned: Oral ETT  Additional Equipment: None  Intra-op Plan:   Post-operative Plan: Extubation in OR  Informed Consent: I have reviewed the patients History and Physical, chart, labs and discussed the procedure including the risks, benefits and alternatives for the proposed anesthesia with the patient or authorized representative who has indicated his/her understanding and acceptance.     Dental advisory given  Plan Discussed with: CRNA  and Anesthesiologist  Anesthesia Plan Comments:          Anesthesia Quick Evaluation

## 2023-10-12 ENCOUNTER — Ambulatory Visit (HOSPITAL_BASED_OUTPATIENT_CLINIC_OR_DEPARTMENT_OTHER): Payer: Self-pay | Admitting: Anesthesiology

## 2023-10-12 ENCOUNTER — Encounter (HOSPITAL_BASED_OUTPATIENT_CLINIC_OR_DEPARTMENT_OTHER): Payer: Self-pay | Admitting: Obstetrics and Gynecology

## 2023-10-12 ENCOUNTER — Encounter (HOSPITAL_BASED_OUTPATIENT_CLINIC_OR_DEPARTMENT_OTHER)
Admission: RE | Disposition: A | Discharge status: Home / Self Care | Payer: Self-pay | Source: Home / Self Care | Attending: Obstetrics and Gynecology

## 2023-10-12 ENCOUNTER — Ambulatory Visit (HOSPITAL_BASED_OUTPATIENT_CLINIC_OR_DEPARTMENT_OTHER)
Admission: RE | Admit: 2023-10-12 | Discharge: 2023-10-12 | Disposition: A | Discharge status: Home / Self Care | Payer: BC Managed Care – PPO | Attending: Obstetrics and Gynecology | Admitting: Obstetrics and Gynecology

## 2023-10-12 ENCOUNTER — Other Ambulatory Visit: Payer: Self-pay

## 2023-10-12 DIAGNOSIS — Z79899 Other long term (current) drug therapy: Secondary | ICD-10-CM | POA: Insufficient documentation

## 2023-10-12 DIAGNOSIS — Z90710 Acquired absence of both cervix and uterus: Secondary | ICD-10-CM | POA: Diagnosis present

## 2023-10-12 DIAGNOSIS — Z86718 Personal history of other venous thrombosis and embolism: Secondary | ICD-10-CM | POA: Diagnosis not present

## 2023-10-12 DIAGNOSIS — I10 Essential (primary) hypertension: Secondary | ICD-10-CM | POA: Insufficient documentation

## 2023-10-12 DIAGNOSIS — N938 Other specified abnormal uterine and vaginal bleeding: Secondary | ICD-10-CM

## 2023-10-12 DIAGNOSIS — N939 Abnormal uterine and vaginal bleeding, unspecified: Secondary | ICD-10-CM | POA: Diagnosis not present

## 2023-10-12 DIAGNOSIS — Z87891 Personal history of nicotine dependence: Secondary | ICD-10-CM | POA: Diagnosis not present

## 2023-10-12 DIAGNOSIS — E663 Overweight: Secondary | ICD-10-CM

## 2023-10-12 DIAGNOSIS — K219 Gastro-esophageal reflux disease without esophagitis: Secondary | ICD-10-CM | POA: Diagnosis not present

## 2023-10-12 DIAGNOSIS — Z01818 Encounter for other preprocedural examination: Secondary | ICD-10-CM

## 2023-10-12 HISTORY — PX: CYSTOSCOPY: SHX5120

## 2023-10-12 HISTORY — DX: Long term (current) use of anticoagulants: Z79.01

## 2023-10-12 HISTORY — DX: Ventricular premature depolarization: I49.3

## 2023-10-12 HISTORY — PX: TOTAL LAPAROSCOPIC HYSTERECTOMY WITH SALPINGECTOMY: SHX6742

## 2023-10-12 HISTORY — DX: Acquired absence of both cervix and uterus: Z90.710

## 2023-10-12 HISTORY — DX: Urinary tract infection, site not specified: N39.0

## 2023-10-12 LAB — CBC
HCT: 42.7 % (ref 36.0–46.0)
Hemoglobin: 14.7 g/dL (ref 12.0–15.0)
MCH: 33.7 pg (ref 26.0–34.0)
MCHC: 34.4 g/dL (ref 30.0–36.0)
MCV: 97.9 fL (ref 80.0–100.0)
Platelets: 262 10*3/uL (ref 150–400)
RBC: 4.36 MIL/uL (ref 3.87–5.11)
RDW: 11.5 % (ref 11.5–15.5)
WBC: 13.8 10*3/uL — ABNORMAL HIGH (ref 4.0–10.5)
nRBC: 0 % (ref 0.0–0.2)

## 2023-10-12 LAB — POCT PREGNANCY, URINE: Preg Test, Ur: NEGATIVE

## 2023-10-12 LAB — TYPE AND SCREEN
ABO/RH(D): B NEG
Antibody Screen: NEGATIVE

## 2023-10-12 SURGERY — HYSTERECTOMY, TOTAL, LAPAROSCOPIC, WITH SALPINGECTOMY
Anesthesia: General | Site: Bladder

## 2023-10-12 MED ORDER — HYDROMORPHONE HCL 1 MG/ML IJ SOLN
0.2500 mg | INTRAMUSCULAR | Status: DC | PRN
  Administered 2023-10-12 (×2): 1 mg via INTRAVENOUS

## 2023-10-12 MED ORDER — HEPARIN SODIUM (PORCINE) 5000 UNIT/ML IJ SOLN
INTRAMUSCULAR | Status: AC
  Filled 2023-10-12: qty 1

## 2023-10-12 MED ORDER — HYDROMORPHONE HCL 1 MG/ML IJ SOLN
INTRAMUSCULAR | Status: AC
  Filled 2023-10-12: qty 1

## 2023-10-12 MED ORDER — FENTANYL CITRATE (PF) 100 MCG/2ML IJ SOLN
INTRAMUSCULAR | Status: AC
  Filled 2023-10-12: qty 2

## 2023-10-12 MED ORDER — SENNA 8.6 MG PO TABS: 2.0000 | ORAL_TABLET | Freq: Every day | ORAL | Status: DC

## 2023-10-12 MED ORDER — SIMETHICONE 80 MG PO CHEW: 80.0000 mg | CHEWABLE_TABLET | Freq: Four times a day (QID) | ORAL | Status: DC | PRN

## 2023-10-12 MED ORDER — SUGAMMADEX SODIUM 200 MG/2ML IV SOLN
INTRAVENOUS | Status: DC | PRN
  Administered 2023-10-12: 200 mg via INTRAVENOUS

## 2023-10-12 MED ORDER — OXYCODONE HCL 5 MG PO TABS: 5.0000 mg | ORAL_TABLET | ORAL | 0 refills | Status: DC | PRN

## 2023-10-12 MED ORDER — ACETAMINOPHEN 500 MG PO TABS
1000.0000 mg | ORAL_TABLET | Freq: Four times a day (QID) | ORAL | Status: DC
  Administered 2023-10-12: 1000 mg via ORAL

## 2023-10-12 MED ORDER — KETOROLAC TROMETHAMINE 30 MG/ML IJ SOLN
INTRAMUSCULAR | Status: AC
  Filled 2023-10-12: qty 1

## 2023-10-12 MED ORDER — DIPHENHYDRAMINE HCL 25 MG PO CAPS
ORAL_CAPSULE | ORAL | Status: AC
  Filled 2023-10-12: qty 1

## 2023-10-12 MED ORDER — ROCURONIUM BROMIDE 10 MG/ML (PF) SYRINGE
PREFILLED_SYRINGE | INTRAVENOUS | Status: DC | PRN
  Administered 2023-10-12 (×3): 10 mg via INTRAVENOUS
  Administered 2023-10-12: 70 mg via INTRAVENOUS

## 2023-10-12 MED ORDER — MIDAZOLAM HCL 2 MG/2ML IJ SOLN
INTRAMUSCULAR | Status: AC
  Filled 2023-10-12: qty 2

## 2023-10-12 MED ORDER — SODIUM CHLORIDE 0.9 % IV SOLN
2.0000 g | INTRAVENOUS | Status: AC
  Administered 2023-10-12: 2 g via INTRAVENOUS

## 2023-10-12 MED ORDER — DEXAMETHASONE SODIUM PHOSPHATE 10 MG/ML IJ SOLN
INTRAMUSCULAR | Status: DC | PRN
  Administered 2023-10-12 (×2): 5 mg via INTRAVENOUS

## 2023-10-12 MED ORDER — OXYCODONE HCL 5 MG PO TABS
ORAL_TABLET | ORAL | Status: AC
  Filled 2023-10-12: qty 1

## 2023-10-12 MED ORDER — LIDOCAINE HCL (PF) 2 % IJ SOLN
INTRAMUSCULAR | Status: AC
  Filled 2023-10-12: qty 5

## 2023-10-12 MED ORDER — ONDANSETRON 8 MG PO TBDP: 8.0000 mg | ORAL_TABLET | Freq: Three times a day (TID) | ORAL | 1 refills | Status: DC | PRN

## 2023-10-12 MED ORDER — IBUPROFEN 200 MG PO TABS: 600.0000 mg | ORAL_TABLET | Freq: Four times a day (QID) | ORAL | Status: DC

## 2023-10-12 MED ORDER — ACETAMINOPHEN 500 MG PO TABS
1000.0000 mg | ORAL_TABLET | ORAL | Status: AC
  Administered 2023-10-12: 1000 mg via ORAL

## 2023-10-12 MED ORDER — SODIUM CHLORIDE 0.9 % IV SOLN
INTRAVENOUS | Status: AC
  Filled 2023-10-12: qty 2

## 2023-10-12 MED ORDER — DEXMEDETOMIDINE HCL IN NACL 80 MCG/20ML IV SOLN
INTRAVENOUS | Status: DC | PRN
  Administered 2023-10-12: 8 ug via INTRAVENOUS
  Administered 2023-10-12: 12 ug via INTRAVENOUS

## 2023-10-12 MED ORDER — KETOROLAC TROMETHAMINE 15 MG/ML IJ SOLN
INTRAMUSCULAR | Status: DC | PRN
  Administered 2023-10-12: 15 mg via INTRAVENOUS

## 2023-10-12 MED ORDER — ONDANSETRON HCL 4 MG/2ML IJ SOLN
INTRAMUSCULAR | Status: DC | PRN
  Administered 2023-10-12: 4 mg via INTRAVENOUS

## 2023-10-12 MED ORDER — ONDANSETRON HCL 4 MG PO TABS: 4.0000 mg | ORAL_TABLET | Freq: Four times a day (QID) | ORAL | Status: DC | PRN

## 2023-10-12 MED ORDER — DEXAMETHASONE SODIUM PHOSPHATE 10 MG/ML IJ SOLN
INTRAMUSCULAR | Status: AC
  Filled 2023-10-12: qty 1

## 2023-10-12 MED ORDER — ONDANSETRON HCL 4 MG/2ML IJ SOLN
INTRAMUSCULAR | Status: AC
  Filled 2023-10-12: qty 2

## 2023-10-12 MED ORDER — ACETAMINOPHEN 500 MG PO TABS: 1000.0000 mg | ORAL_TABLET | Freq: Three times a day (TID) | ORAL | 0 refills | Status: DC

## 2023-10-12 MED ORDER — PROPOFOL 10 MG/ML IV BOLUS
INTRAVENOUS | Status: DC | PRN
  Administered 2023-10-12: 200 mg via INTRAVENOUS

## 2023-10-12 MED ORDER — LIDOCAINE 2% (20 MG/ML) 5 ML SYRINGE
INTRAMUSCULAR | Status: DC | PRN
  Administered 2023-10-12: 60 mg via INTRAVENOUS

## 2023-10-12 MED ORDER — PROPOFOL 10 MG/ML IV BOLUS
INTRAVENOUS | Status: AC
  Filled 2023-10-12: qty 20

## 2023-10-12 MED ORDER — ONDANSETRON HCL 4 MG/2ML IJ SOLN: 4.0000 mg | Freq: Once | INTRAMUSCULAR | Status: DC | PRN

## 2023-10-12 MED ORDER — ACETAMINOPHEN 500 MG PO TABS
ORAL_TABLET | ORAL | Status: AC
  Filled 2023-10-12: qty 2

## 2023-10-12 MED ORDER — KETOROLAC TROMETHAMINE 30 MG/ML IJ SOLN
15.0000 mg | Freq: Four times a day (QID) | INTRAMUSCULAR | Status: DC
  Administered 2023-10-12: 15 mg via INTRAVENOUS

## 2023-10-12 MED ORDER — MIDAZOLAM HCL 2 MG/2ML IJ SOLN
INTRAMUSCULAR | Status: DC | PRN
  Administered 2023-10-12: 2 mg via INTRAVENOUS

## 2023-10-12 MED ORDER — POVIDONE-IODINE 10 % EX SWAB: 2.0000 | Freq: Once | CUTANEOUS | Status: DC

## 2023-10-12 MED ORDER — HYDROMORPHONE HCL 1 MG/ML IJ SOLN: 0.2000 mg | INTRAMUSCULAR | Status: DC | PRN

## 2023-10-12 MED ORDER — OXYCODONE HCL 5 MG PO TABS
5.0000 mg | ORAL_TABLET | ORAL | Status: DC | PRN
  Administered 2023-10-12 (×2): 5 mg via ORAL

## 2023-10-12 MED ORDER — SCOPOLAMINE 1 MG/3DAYS TD PT72SCOPOLAMINE 1 MG/3DAYS
1.0000 | MEDICATED_PATCH | TRANSDERMAL | Status: DC
Start: 2023-10-12 — End: 2023-10-12
  Administered 2023-10-12: 1.5 mg via TRANSDERMAL

## 2023-10-12 MED ORDER — FENTANYL CITRATE (PF) 100 MCG/2ML IJ SOLN
INTRAMUSCULAR | Status: DC | PRN
  Administered 2023-10-12 (×4): 50 ug via INTRAVENOUS

## 2023-10-12 MED ORDER — SENNA 8.6 MG PO TABS: 2.0000 | ORAL_TABLET | Freq: Every evening | ORAL | 0 refills | Status: DC | PRN

## 2023-10-12 MED ORDER — HYDROMORPHONE HCL 2 MG/ML IJ SOLN
INTRAMUSCULAR | Status: AC
  Filled 2023-10-12: qty 1

## 2023-10-12 MED ORDER — ONDANSETRON HCL 4 MG/2ML IJ SOLN
4.0000 mg | Freq: Four times a day (QID) | INTRAMUSCULAR | Status: DC | PRN
  Administered 2023-10-12: 4 mg via INTRAVENOUS

## 2023-10-12 MED ORDER — HYDROCODONE-ACETAMINOPHEN 7.5-325 MG PO TABS: 1.0000 | ORAL_TABLET | Freq: Once | ORAL | Status: DC | PRN

## 2023-10-12 MED ORDER — DEXTROSE IN LACTATED RINGERS 5 % IV SOLN: INTRAVENOUS | Status: DC

## 2023-10-12 MED ORDER — DIPHENHYDRAMINE HCL 25 MG PO CAPS
25.0000 mg | ORAL_CAPSULE | Freq: Four times a day (QID) | ORAL | Status: DC | PRN
  Administered 2023-10-12: 25 mg via ORAL

## 2023-10-12 MED ORDER — ROCURONIUM BROMIDE 10 MG/ML (PF) SYRINGE
PREFILLED_SYRINGE | INTRAVENOUS | Status: AC
  Filled 2023-10-12: qty 10

## 2023-10-12 MED ORDER — SCOPOLAMINE 1 MG/3DAYS TD PT72
MEDICATED_PATCH | TRANSDERMAL | Status: AC
  Filled 2023-10-12: qty 1

## 2023-10-12 MED ORDER — HEPARIN SODIUM (PORCINE) 5000 UNIT/ML IJ SOLN
5000.0000 [IU] | INTRAMUSCULAR | Status: AC
  Administered 2023-10-12: 5000 [IU] via SUBCUTANEOUS

## 2023-10-12 MED ORDER — LACTATED RINGERS IV SOLN: INTRAVENOUS | Status: DC

## 2023-10-12 MED ORDER — AMISULPRIDE (ANTIEMETIC) 5 MG/2ML IV SOLN: 10.0000 mg | Freq: Once | INTRAVENOUS | Status: DC | PRN

## 2023-10-12 SURGICAL SUPPLY — 51 items
APPLICATOR ARISTA FLEXITIP XL (MISCELLANEOUS) IMPLANT
BLADE SURG 10 STRL SS (BLADE) IMPLANT
CABLE HIGH FREQUENCY MONO STRZ (ELECTRODE) IMPLANT
COVER MAYO STAND STRL (DRAPES) ×2 IMPLANT
COVER SURGICAL LIGHT HANDLE (MISCELLANEOUS) IMPLANT
DEFOGGER SCOPE WARMER CLEARIFY (MISCELLANEOUS) ×2 IMPLANT
DERMABOND ADVANCED .7 DNX12 (GAUZE/BANDAGES/DRESSINGS) ×2 IMPLANT
DRAPE SURG IRRIG POUCH 19X23 (DRAPES) ×2 IMPLANT
DURAPREP 26ML APPLICATOR (WOUND CARE) ×2 IMPLANT
GLOVE BIO SURGEON STRL SZ7 (GLOVE) ×4 IMPLANT
GLOVE BIOGEL PI IND STRL 7.0 (GLOVE) ×4 IMPLANT
HEMOSTAT ARISTA ABSORB 3G PWDR (HEMOSTASIS) IMPLANT
IRRIG SUCT STRYKERFLOW 2 WTIP (MISCELLANEOUS) ×4
IRRIGATION SUCT STRKRFLW 2 WTP (MISCELLANEOUS) ×4 IMPLANT
KIT PINK PAD W/HEAD ARE REST (MISCELLANEOUS) ×2
KIT PINK PAD W/HEAD ARM REST (MISCELLANEOUS) ×2 IMPLANT
KIT TURNOVER CYSTO (KITS) ×2 IMPLANT
L-HOOK LAP DISP 36CM (ELECTROSURGICAL) ×2
LHOOK LAP DISP 36CM (ELECTROSURGICAL) ×2 IMPLANT
NDL INSUFFLATION 14GA 120MM (NEEDLE) IMPLANT
NEEDLE INSUFFLATION 14GA 120MM (NEEDLE)
NS IRRIG 500ML POUR BTL (IV SOLUTION) ×2 IMPLANT
OCCLUDER COLPOPNEUMO (BALLOONS) ×2 IMPLANT
PACK LAPAROSCOPY BASIN (CUSTOM PROCEDURE TRAY) ×2 IMPLANT
PACK ROBOTIC GOWN (GOWN DISPOSABLE) ×2 IMPLANT
PENCIL BUTTON HOLSTER BLD 10FT (ELECTRODE) ×2 IMPLANT
POUCH LAPAROSCOPIC INSTRUMENT (MISCELLANEOUS) ×2 IMPLANT
PROTECTOR NERVE ULNAR (MISCELLANEOUS) ×4 IMPLANT
SCISSORS LAP 5X35 DISP (ENDOMECHANICALS) IMPLANT
SCRUB CHG 4% DYNA-HEX 4OZ (MISCELLANEOUS) ×4 IMPLANT
SEALER TISSUE G2 CVD JAW 35 (ENDOMECHANICALS) IMPLANT
SET CYSTO W/LG BORE CLAMP LF (SET/KITS/TRAYS/PACK) ×2 IMPLANT
SET TUBE SMOKE EVAC HIGH FLOW (TUBING) ×2 IMPLANT
SLEEVE Z-THREAD 5X100MM (TROCAR) ×4 IMPLANT
SUT PDS AB 2-0 CT2 27 (SUTURE) ×4 IMPLANT
SUT VIC AB 0 CT1 27XBRD ANBCTR (SUTURE) IMPLANT
SUT VIC AB 4-0 PS2 27 (SUTURE) ×2 IMPLANT
SUT VICRYL 0 UR6 27IN ABS (SUTURE) ×4 IMPLANT
SUT VLOC 180 2-0 9IN GS21 (SUTURE) ×2 IMPLANT
SYR 10ML LL (SYRINGE) ×2 IMPLANT
SYR 50ML LL SCALE MARK (SYRINGE) ×2 IMPLANT
TIP RUMI ORANGE 6.7MMX12CM (TIP) ×2 IMPLANT
TIP UTERINE 5.1X6CM LAV DISP (MISCELLANEOUS) ×2 IMPLANT
TIP UTERINE 6.7X10CM GRN DISP (MISCELLANEOUS) ×2 IMPLANT
TIP UTERINE 6.7X6CM WHT DISP (MISCELLANEOUS) ×2 IMPLANT
TIP UTERINE 6.7X8CM BLUE DISP (MISCELLANEOUS) ×2 IMPLANT
TOWEL OR 17X24 6PK STRL BLUE (TOWEL DISPOSABLE) ×4 IMPLANT
TRAY FOLEY W/BAG SLVR 14FR LF (SET/KITS/TRAYS/PACK) ×2 IMPLANT
TROCAR ADV FIXATION 11X100MM (TROCAR) ×2 IMPLANT
TROCAR Z-THREAD FIOS 5X100MM (TROCAR) ×2 IMPLANT
UNDERPAD 30X36 HEAVY ABSORB (UNDERPADS AND DIAPERS) ×2 IMPLANT

## 2023-10-12 NOTE — Transfer of Care (Signed)
Immediate Anesthesia Transfer of Care Note  Patient: Linda [name]  Procedure(s) Performed: Procedure(s) (LRB): TOTAL LAPAROSCOPIC HYSTERECTOMY WITH BILATERAL SALPINGECTOMY (Bilateral) CYSTOSCOPY (N/A)  Patient Location: PACU  Anesthesia Type: General  Level of Consciousness: awake, oriented, sedated and patient cooperative  Airway & Oxygen Therapy: Patient Spontanous Breathing and Patient connected to face mask oxygen  Post-op Assessment: Report given to PACU RN and Post -op Vital signs reviewed and stable  Post vital signs: Reviewed and stable  Complications: No apparent anesthesia complications Last Vitals:  Vitals Value Taken Time  BP    Temp    Pulse 94 10/12/23 1214  Resp 10 10/12/23 1214  SpO2 94 % 10/12/23 1214  Vitals shown include unfiled device data.  Last Pain:  Vitals:   10/12/23 0753  TempSrc: Oral  PainSc: 4       Patients Stated Pain Goal: 5 (10/12/23 0753)  Complications: No notable events documented.

## 2023-10-12 NOTE — Anesthesia Postprocedure Evaluation (Signed)
Anesthesia Post Note  Patient: Linda [name]  Procedure(s) Performed: TOTAL LAPAROSCOPIC HYSTERECTOMY WITH BILATERAL SALPINGECTOMY (Bilateral: Abdomen) CYSTOSCOPY (Bladder)     Patient location during evaluation: PACU Anesthesia Type: General Level of consciousness: awake and alert and oriented Pain management: pain level controlled Vital Signs Assessment: post-procedure vital signs reviewed and stable Respiratory status: spontaneous breathing, nonlabored ventilation and respiratory function stable Cardiovascular status: blood pressure returned to baseline and stable Postop Assessment: no apparent nausea or vomiting Anesthetic complications: no   No notable events documented.  Last Vitals:  Vitals:   10/12/23 1246 10/12/23 1254  BP: (!) 128/93   Pulse: 96 92  Resp: (!) 8 (!) 8  Temp:    SpO2: 96% 95%    Last Pain:  Vitals:   10/12/23 1246  TempSrc:   PainSc: Asleep                 Smitty Ackerley A.

## 2023-10-12 NOTE — Anesthesia Procedure Notes (Signed)
Procedure Name: Intubation Date/Time: 10/12/2023 9:17 AM  Performed by: Francie Massing, CRNAPre-anesthesia Checklist: Patient identified, Emergency Drugs available, Suction available and Patient being monitored Patient Re-evaluated:Patient Re-evaluated prior to induction Oxygen Delivery Method: Circle system utilized Preoxygenation: Pre-oxygenation with 100% oxygen Induction Type: IV induction Ventilation: Mask ventilation without difficulty Laryngoscope Size: Mac and 3 Grade View: Grade I Tube type: Oral Tube size: 7.0 mm Number of attempts: 1 Airway Equipment and Method: Stylet and Oral airway Placement Confirmation: ETT inserted through vocal cords under direct vision, positive ETCO2 and breath sounds checked- equal and bilateral Secured at: 22 cm Tube secured with: Tape Dental Injury: Teeth and Oropharynx as per pre-operative assessment

## 2023-10-12 NOTE — Op Note (Signed)
Linda [name] PROCEDURE DATE: 10/12/2023  PREOPERATIVE DIAGNOSIS: Abnormal uterine bleding POSTOPERATIVE DIAGNOSIS: The same PROCEDURE: Total laparoscopic hysterectomy, bilateral salpingectomy, cystoscopy SURGEON:  Dr. Harvie Bridge ASSISTANT:  Dr. Washoe Bing.  An experienced assistant was required given the standard of surgical care given the complexity of the case.  This assistant was needed for exposure, dissection, suctioning, retraction, instrument exchange, and for overall help during the procedure.  INDICATIONS: 42 y.o. [id number]  here for definitive surgical management secondary to the indications listed under preoperative diagnoses; please see preoperative note for further details.  Risks of surgery were discussed with the patient including but not limited to: bleeding which may require transfusion or reoperation; infection which may require antibiotics; injury to bowel, bladder, ureters or other surrounding organs; need for additional procedures; thromboembolic phenomenon, incisional problems and other postoperative/anesthesia complications. Written informed consent was obtained.    FINDINGS: Infraumbilical hernia containing omentum. Filmy adhesive disease of the sigmoid colon, left fallopian tube and ovary, and left pelvic sidewall. Diffuse superficial endometriosis on anterior abdominal wall, bladder, bilateral ovarian fossa. Mild adhesive disease between uterus and bladder. Bilateral fallopian tubes with post-tubal changes. Otherwise normal uterus and bilateral ovaries. Dark staining of the rectum and sigmoid overlying the rectum suspected to be related to prior surgery.  Ureters visualized transperitoneally Cystoscopic exam with intact bladder, no foreign body, and bilateral ureteral jets  ANESTHESIA:    General INTRAVENOUS FLUIDS:1000  ml ESTIMATED BLOOD LOSS:100 ml URINE OUTPUT: 100 ml   SPECIMENS: Uterus, cervix, bilateral fallopian tubes COMPLICATIONS: None  immediate  PROCEDURE IN DETAIL:  The patient received intravenous antibiotics and had sequential compression devices applied to her lower extremities while in the preoperative area.  She was then taken to the operating room where general anesthesia was administered and was found to be adequate.  She was placed in the dorsal lithotomy position, and was prepped and draped in a sterile manner.    After an adequate timeout was performed, a Rumi uterine manipulator was placed without complication.  A Foley catheter was inserted into her bladder and attached to constant drainage.   Attention turned to patient's abdomen. 10mm skin incision made with the scalpel in the umbilicus. Umbilical stalk was grasped and walked down to the fascia while clearing fascia off bluntly with ray tec. The fascia was the grasped, elevated and an incision was made with the scalpel. The peritoneum was entered bluntly with an S retractor. Fascia was tagged with 0-vicryl. The Hasson trocar was placed and abdomen was insufflated after confirming intraperitoneal entry with the laparoscope. Careful inspection below the point of entry revealed no evidence of bowel injury. Patient placed in steep trendelenburg.5 mm ports were placed under direct visualization in the RLQ and LLQ. 5mm laparoscope placed into both ports to confirm no bowel contents within the infraumbilical hernia. These omental adhesions were then taken down with the Enseal. Hemostasis noted. A 5mm suprapubic port was placed.  The pelvis was then carefully examined. The Enseal was used to divide filmy adhesive disease between the sigmoid, left pelvic sidewall and left fallopian tube/ovary. Attention was then turned to the fallopian tubes; these were freed from the underlying mesosalpinx and the uterine attachments using the Enseal device.  The bilateral round ligaments and then anterior leaf of the broad ligament were then clamped and transected with the Enseal device. The  uteroovarian ligaments were clamped, cauterized and transected with the Enseal. The posterior leaf of the broad was developed and the uterine arteries were skeletonized. Bladder flap was  developed/ The ureters were noted to be safely away from the area of dissection.  The bladder was then bluntly dissected off the lower uterine segment.  At this point, attention was turned to the uterine vessels, which were clamped and ligated using the Enseal. After confirming bladder was well away from area of dissection, attention was then turned to the cervicovaginal junction. The bovie hook was used to transect the cervix from the surrounding vagina using the ring of the Rumi as a guide. This was done circumferentially allowing total hysterectomy.  The uterus was then removed from the vagina. The left cuff angle was closed with an interrupted suture of 0-PDS and the suture was used to elevate the cuff. The vaginal cuff incision was then closed with 2-0 V-loc. Small hematoma noted below the left cuff angle but it was stable in size. Overall appropriate hemostasis was noted. A Raytec was inserted into the abdomen to hold pressure.   Attention was turned to cystoscopy. Bilateral ureteral jets noted, bladder intact with no evidence of injury or foreign body.   Returned to the abdomen. Abdomen was irrigated and excellent hemostasis noted. No further expansion of left cuff hematoma was seen. Ray tec removed. Arista was applied given patient's plan to resume anticoagulation on post op day 1.   All trocars were removed under direct visualization, and the abdomen was desufflated. The fascia was closed at the umbilicus by tying the 0-vicryl tags. An additional stitch of 0-vicryl was placed for adequate closure. All skin incisions were closed with 4-0 Vicryl subcuticular stitches and Dermabond. The patient tolerated the procedures well.    All instruments, needles, and sponge counts were correct x 2. The patient was taken to the  recovery room awake, extubated and in stable condition.   Harvie Bridge, MD Obstetrician & Gynecologist, [facility] for [employer], [facility]

## 2023-10-12 NOTE — H&P (Signed)
PRE OPERATIVE HISTORY AND PHYSICAL  Subjective:  Linda [name] is a 42 y.o. [phone] with hx TIA/VTE on prophylactic AC presenting for scheduled total laparoscopic hysterectomy, bilateral salpingectomy, and cystoscopy for AUB  Linda [name] has history of irregular cycles likely related to menopause transition, anticoagulation, and endometrial polyp. She has had irregular heavy and prolonged bleeding. Her workup was notable for [place] w/ possible endometrial polyp and she is now 2 weeks s/p D&C confirming presence of polyp, overall path benign. Her symptoms were able to be controlled with POPs, but unfortunately she has a history of VTE and recent hypercoagulability workup shows elevated homocysteine level. Her hematologist recommends against hormonal medications to treat her bleeding. Linda [name] strongly desires definitive management of her bleeding.   Notable elements of her workup include: - CBC 07/02/23 Hgb 15.5 - Pap 05/14/23 NILM/HPV neg  - Pelvic US 05/20/23 - 7.7 x 3.9 x 5.8cm anteverted uterus, EL 6.58mm with possible focal endometrial lesion, normal bilateral ovaries - Surgical pathology 08/19/23 - benign weakly proliferative endometrium, benign endometrial polyp   PMH: TIA, DVT w/ elevated homocystine level, seizure, HTN, dep/anx, IBS/IC, HLD, GERD, fibromyalgia PSH: CSx2, cysto, D&C x2, transanal resection of rectal mass, carpal tunnel release OB: CSx2, SABx2 Pap Hx: last pap 2024 normal, history of HPV+ pap 2019  Objective:   Vitals:   10/07/23 1431 10/12/23 0753  BP:  (!) 142/99  Pulse:  96  Resp:  17  Temp:  98 F (36.7 C)  TempSrc:  Oral  SpO2:  99%  Weight: 124.7 kg 78.2 kg  Height: 5\' 6"  (1.676 m) 5\' 6"  (1.676 m)   General:  Alert, oriented and cooperative. Patient is in no acute distress.  Skin: Skin is warm and dry. No rash noted.   Cardiovascular: Normal heart rate noted  Respiratory: Normal respiratory effort, no problems with respiration noted  Abdomen: Soft, non-tender,  non-distended    Assessment and Plan:  Linda [name] is a 42 y.o. with hx TIA/VTE on prophylactic AC presenting for scheduled total laparoscopic hysterectomy, bilateral salpingectomy, and cystoscopy for AUB  AUB - Diagnosis: AUB - Planned surgery: TLH/BS/cysto - Risks of surgery include but are not limited to:  Bleeding - Can bleed enough to need transfusion or need for additional surgeries I.e. conversation to open surgery.  Infection - can develop around incisions or internally. May require hospitalization Injury to surrounding organs/tissues (i.e. bowel/bladder/ureters) - discussed how each of these is managed I.e. bladder injury requires catheter for 10-14 days after surgery. Reviewed higher risk of bladder injury due to history of 2 prior CS Need for additional procedures - Would be specific to a complication or injury Wound complications or vaginal cuff dehiscence - No specific wound unless converted to open surgery or laparoscopic Hospital re-admission - In the event of a delayed complication being recognized I.e. ureteral injury discussed Conversion to open surgery - reviewed some complications may necessitate or it may be the only way to complete the surgery.   VTE - Discussed risk of blood clots following delivery. She is higher risk due to her history and we will work with her PCP/hematologist to develop an [facility] strategy - We discussed alternatives to hysterectomy including IUD and endometrial ablation. She declines these options and strongly prefers definitive management.  - We discussed postop restrictions, precautions and expectations. - ERAS protocol - Cefotetan for pre op abx - Anticipate same day discharge  To OR when ready  Future Appointments  Date Time Provider Department Center  03/07/2024 10:10  AM Christen Butter, NP [facility] None   Lennart Pall, MD

## 2023-10-13 ENCOUNTER — Encounter (HOSPITAL_BASED_OUTPATIENT_CLINIC_OR_DEPARTMENT_OTHER): Payer: Self-pay | Admitting: Obstetrics and Gynecology

## 2023-10-13 ENCOUNTER — Encounter: Payer: Self-pay | Admitting: Obstetrics and Gynecology

## 2023-10-13 LAB — SURGICAL PATHOLOGY

## 2023-10-13 MED ORDER — OXYCODONE HCL 5 MG PO TABS: 5.0000 mg | ORAL_TABLET | ORAL | 0 refills | Status: DC | PRN

## 2023-10-28 ENCOUNTER — Encounter: Payer: Self-pay | Admitting: Obstetrics and Gynecology

## 2023-10-30 ENCOUNTER — Ambulatory Visit (INDEPENDENT_AMBULATORY_CARE_PROVIDER_SITE_OTHER): Payer: BC Managed Care – PPO | Admitting: Obstetrics and Gynecology

## 2023-10-30 VITALS — BP 123/90 | HR 97 | Wt 175.0 lb

## 2023-10-30 DIAGNOSIS — Z48816 Encounter for surgical aftercare following surgery on the genitourinary system: Secondary | ICD-10-CM

## 2023-10-30 DIAGNOSIS — N760 Acute vaginitis: Secondary | ICD-10-CM

## 2023-10-30 DIAGNOSIS — N99820 Postprocedural hemorrhage and hematoma of a genitourinary system organ or structure following a genitourinary system procedure: Secondary | ICD-10-CM

## 2023-10-30 MED ORDER — AMOXICILLIN-POT CLAVULANATE 875-125 MG PO TABS
1.0000 | ORAL_TABLET | Freq: Two times a day (BID) | ORAL | 0 refills | Status: DC
Start: 2023-10-30 — End: 2023-11-26

## 2023-10-30 NOTE — Progress Notes (Signed)
 Pt complains of cramping and bleeding - post hysterectomy. Pt states pain has been a little worse the past few days.  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

## 2023-10-30 NOTE — Progress Notes (Signed)
   POST OPERATIVE GYNECOLOGY VISIT  Subjective:  Linda [name] is a 43 y.o. POD18 s/p TLH/BS/cysto presenting for post op vaginal bleeding   Spotting started Wednesday (2 days ago) and pt felt more sore than usual. Was back to work this week but sitting at a desk, no heavy lifting. Today, noted some bright red bleeding. Has been taking tylenol  prn. More sore in lower abdomen and gets twinges of pain on top of that. Wants to make sure everything is healing OK esp because she is going to start walking more at work next week.   No nausea/vomiting/fevers/chills. Otherwise feeling OK.   Objective:   Vitals:   10/30/23 1044  BP: (!) 123/90  Pulse: 97  Weight: 175 lb (79.4 kg)   General:  Alert, oriented and cooperative. Patient is in no acute distress.  Skin: Skin is warm and dry. No rash noted.   Cardiovascular: Normal heart rate noted  Respiratory: Normal respiratory effort, no problems with respiration noted  Abdomen: Soft, non-tender, non-distended   Pelvic: NEFG.  Small amount of dark old blood throughout the vagina (<5cc total). Cleared with fox swabs. Cuff visualized with no active bleeding. Small edge in center of cuff friable when touched with fox swab, defect ~1cm with possible yellow/green discharge noted.  Cuff palpably intact but tender. No palpable hematoma/mass.  Exam performed in the presence of a chaperone  Assessment and Plan:  Linda [name] is a 43 y.o. POD18 s/p TLH/BS/cysto on xarelto  with suspected vaginal cuff cellulitis vs post op hematoma  Discussed possible draining post op hematoma and/or vaginal cuff cellulitis. No e/o dehiscence on exam today.  Will treat with augmentin  BID x 10d Continue strict lifting precautions & pelvic rest Follow up with me on 1/13 for reexamination of cuff - message routed to [place] office to schedule  Return in about 4 days (around 11/03/2023).  Future Appointments  Date Time Provider Department Center  11/02/2023  1:10 PM Erik Kieth BROCKS, MD [facility]  11/11/2023 11:10 AM Erik Kieth BROCKS, MD [facility]  03/07/2024 10:10 AM Willo Mini, NP [facility] None   Kieth BROCKS Erik, MD

## 2023-11-02 ENCOUNTER — Ambulatory Visit (INDEPENDENT_AMBULATORY_CARE_PROVIDER_SITE_OTHER): Payer: BC Managed Care – PPO | Admitting: Obstetrics and Gynecology

## 2023-11-02 VITALS — BP 136/94 | HR 86 | Ht 66.0 in | Wt 179.0 lb

## 2023-11-02 DIAGNOSIS — N760 Acute vaginitis: Secondary | ICD-10-CM

## 2023-11-02 MED ORDER — FLUCONAZOLE 150 MG PO TABS: 150.0000 mg | ORAL_TABLET | Freq: Once | ORAL | 0 refills | Status: AC

## 2023-11-02 NOTE — Progress Notes (Signed)
   RETURN GYNECOLOGY VISIT  Subjective:  Linda [name] is a 43 y.o. [id number] POD#21 s/p TLH/BS/cysto presenting for follow up of suspected vaginal cuff cellulitis vs bleeding from post op hematoma  Saw patient 4 days ago for light vaginal bleeding and increasing lower abdominal pain. Cuff intact, but small defect noted with small amount of blood in vaginal vault and cuff tenderness. Rx sent for augmentin . Today, she reports bleeding was minimal over the weekend and increased a little bit today after walking at job sites this morning. Her pain now seems to be associated with gas/bowel movements.  Objective:   Vitals:   11/02/23 1304  BP: (!) 136/94  Pulse: 86  Weight: 179 lb (81.2 kg)  Height: 5' 6 (1.676 m)    General:  Alert, oriented and cooperative. Patient is in no acute distress.  Skin: Skin is warm and dry. No rash noted.   Cardiovascular: Normal heart rate noted  Respiratory: Normal respiratory effort, no problems with respiration noted  Abdomen: Soft, non-tender, non-distended   Pelvic: NEFG. Cuff intact. Defect no longer visualized. No bleeding edges to cauterize. Mild erythema of cuff c/f cellulitis  Exam performed in the presence of a chaperone  Assessment and Plan:  Linda [name] is a 43 y.o. with vaginal cuff cellulitis and suspected post op hematoma   Continue augmentin  Reviewed elevated risk of cuff dehiscence and warning si/sx for cuff dehiscence. Strict return precautions for fevers, severe pain or HVB Continue strict lifting and sexual activity restrictions  RTC in 1 week for post op check  Future Appointments  Date Time Provider Department Center  11/11/2023 11:10 AM Erik Kieth BROCKS, MD [facility]  03/07/2024 10:10 AM Willo Mini, NP [facility] None   Kieth BROCKS Erik, MD

## 2023-11-04 DIAGNOSIS — T7840XA Allergy, unspecified, initial encounter: Secondary | ICD-10-CM | POA: Diagnosis not present

## 2023-11-04 DIAGNOSIS — Z87891 Personal history of nicotine dependence: Secondary | ICD-10-CM | POA: Diagnosis not present

## 2023-11-04 DIAGNOSIS — J029 Acute pharyngitis, unspecified: Secondary | ICD-10-CM | POA: Diagnosis not present

## 2023-11-04 DIAGNOSIS — X58XXXA Exposure to other specified factors, initial encounter: Secondary | ICD-10-CM | POA: Diagnosis not present

## 2023-11-09 NOTE — Progress Notes (Unsigned)
   POST OPERATIVE GYNECOLOGY VISIT  Subjective:  Ronnae Osmond is a 43 y.o. [id number] 4 weeks s/p TLH/BS/cysto with post op course c/b suspected cuff cellulitis presenting for post op appt  Recently completed course of augmentin for suspected vaginal cuff cellulitis. Seen in ED 1/15 with allergic reaction to ***. Today, she reports ***  Reviewed path & intra op images Path benign uterus, cervix, & tubes Images notable for diffuse superficial endometriosis, discoloration of rectum/omentum suspected to be related to prior surgery.   Objective:  There were no vitals filed for this visit.  General:  Alert, oriented and cooperative. Patient is in no acute distress.  Skin: Skin is warm and dry. No rash noted.   Cardiovascular: Normal heart rate noted  Respiratory: Normal respiratory effort, no problems with respiration noted  Abdomen: Soft, non-tender, non-distended   Pelvic: NEFG.   Exam performed in the presence of a chaperone  Assessment and Plan:  Natonya Kosmalski is a 43 y.o. with ***  There are no diagnoses linked to this encounter.  No follow-ups on file.  Future Appointments  Date Time Provider Department Center  11/11/2023 11:10 AM Lennart Pall, MD [facility]  03/07/2024 10:10 AM Christen Butter, NP [facility] None    Lennart Pall, MD

## 2023-11-11 ENCOUNTER — Telehealth: Payer: Self-pay | Admitting: *Deleted

## 2023-11-11 ENCOUNTER — Ambulatory Visit: Payer: BC Managed Care – PPO | Admitting: Obstetrics and Gynecology

## 2023-11-11 NOTE — Telephone Encounter (Signed)
Left patient a message to call and reschedule missed Post Op appointment.

## 2023-11-12 ENCOUNTER — Encounter: Payer: Self-pay | Admitting: Obstetrics and Gynecology

## 2023-11-14 ENCOUNTER — Emergency Department (HOSPITAL_COMMUNITY): Payer: BC Managed Care – PPO

## 2023-11-14 ENCOUNTER — Other Ambulatory Visit: Payer: Self-pay

## 2023-11-14 ENCOUNTER — Emergency Department (HOSPITAL_COMMUNITY)
Admission: EM | Admit: 2023-11-14 | Discharge: 2023-11-14 | Discharge status: Against Medical Advice | Payer: BC Managed Care – PPO | Attending: Student | Admitting: Student

## 2023-11-14 ENCOUNTER — Encounter (HOSPITAL_COMMUNITY): Payer: Self-pay | Admitting: *Deleted

## 2023-11-14 DIAGNOSIS — Z5321 Procedure and treatment not carried out due to patient leaving prior to being seen by health care provider: Secondary | ICD-10-CM | POA: Insufficient documentation

## 2023-11-14 DIAGNOSIS — K651 Peritoneal abscess: Secondary | ICD-10-CM | POA: Diagnosis not present

## 2023-11-14 DIAGNOSIS — N939 Abnormal uterine and vaginal bleeding, unspecified: Secondary | ICD-10-CM | POA: Insufficient documentation

## 2023-11-14 DIAGNOSIS — R1031 Right lower quadrant pain: Secondary | ICD-10-CM | POA: Diagnosis not present

## 2023-11-14 DIAGNOSIS — Z90710 Acquired absence of both cervix and uterus: Secondary | ICD-10-CM | POA: Diagnosis not present

## 2023-11-14 LAB — BASIC METABOLIC PANEL
Anion gap: 11 (ref 5–15)
BUN: 19 mg/dL (ref 6–20)
CO2: 24 mmol/L (ref 22–32)
Calcium: 9.4 mg/dL (ref 8.9–10.3)
Chloride: 101 mmol/L (ref 98–111)
Creatinine, Ser: 0.72 mg/dL (ref 0.44–1.00)
GFR, Estimated: >60 mL/min (ref >60)
Glucose, Bld: 102 mg/dL — ABNORMAL HIGH (ref 70–99)
Potassium: 3.2 mmol/L — ABNORMAL LOW (ref 3.5–5.1)
Sodium: 136 mmol/L (ref 135–145)

## 2023-11-14 LAB — CBC WITH DIFFERENTIAL/PLATELET
Abs Immature Granulocytes: 0.03 10*3/uL (ref 0.00–0.07)
Basophils Absolute: 0.1 10*3/uL (ref 0.0–0.1)
Basophils Relative: 1 %
Eosinophils Absolute: 0.3 10*3/uL (ref 0.0–0.5)
Eosinophils Relative: 4 %
HCT: 45.7 % (ref 36.0–46.0)
Hemoglobin: 16.3 g/dL — ABNORMAL HIGH (ref 12.0–15.0)
Immature Granulocytes: 0 %
Lymphocytes Relative: 32 %
Lymphs Abs: 2.4 10*3/uL (ref 0.7–4.0)
MCH: 33 pg (ref 26.0–34.0)
MCHC: 35.7 g/dL (ref 30.0–36.0)
MCV: 92.5 fL (ref 80.0–100.0)
Monocytes Absolute: 0.8 10*3/uL (ref 0.1–1.0)
Monocytes Relative: 11 %
Neutro Abs: 3.9 10*3/uL (ref 1.7–7.7)
Neutrophils Relative %: 52 %
Platelets: 287 10*3/uL (ref 150–400)
RBC: 4.94 MIL/uL (ref 3.87–5.11)
RDW: 11.9 % (ref 11.5–15.5)
WBC: 7.5 10*3/uL (ref 4.0–10.5)
nRBC: 0 % (ref 0.0–0.2)

## 2023-11-14 LAB — HCG, SERUM, QUALITATIVE: Preg, Serum: NEGATIVE

## 2023-11-14 NOTE — ED Provider Triage Note (Signed)
Emergency Medicine Provider Triage Evaluation Note  Linda [name] , a 43 y.o. female  was evaluated in triage.  Pt complains of vaginal bleeding.  Patient with history of laparoscopic hysterectomy with vaginal cough on December 23.  Subsequently has been evaluated by OB/GYN for spotting and vaginal cuff cellulitis.  Patient states that tonight she woke up and noticed a pool of blood coming from her vagina.  She states  she was advised to go straight to the emergency department if she developed significant bleeding.  She currently has a pad in place but has not had to change it yet.  Review of Systems  Positive:  Negative:   Physical Exam  BP (!) 137/107 Comment: Simultaneous filing. User may not have seen previous data.  Pulse 94 Comment: Simultaneous filing. User may not have seen previous data.  Temp 97.8 F (36.6 C) (Oral) Comment: Simultaneous filing. User may not have seen previous data. Comment (Src): Simultaneous filing. User may not have seen previous data.  Resp 16 Comment: Simultaneous filing. User may not have seen previous data.  LMP 06/03/2023   SpO2 100% Comment: Simultaneous filing. User may not have seen previous data. Gen:   Awake, no distress   Resp:  Normal effort  MSK:   Moves extremities without difficulty  Other:    Medical Decision Making  Medically screening exam initiated at 3:08 AM.  Appropriate orders placed.  Antoinetta Berrones was informed that the remainder of the evaluation will be completed by another provider, this initial triage assessment does not replace that evaluation, and the importance of remaining in the ED until their evaluation is complete.     Darrick Grinder, PA-C 11/14/23 [phone]

## 2023-11-14 NOTE — ED Notes (Signed)
Patient visualized leaving the lobby.

## 2023-11-14 NOTE — ED Triage Notes (Signed)
Pt had hysterectomy on 12/25, then an infection following vaginal cuff placement. tonight woke up at 1am in puddle of blood and has had worsening bleeding, concerned for issues regarding cuff. C/o pain in RLQ

## 2023-11-14 NOTE — ED Notes (Signed)
Gone for [place]

## 2023-11-15 ENCOUNTER — Inpatient Hospital Stay (HOSPITAL_COMMUNITY)
Admission: AD | Admit: 2023-11-15 | Discharge: 2023-11-16 | Disposition: A | Discharge status: Home / Self Care | Payer: BC Managed Care – PPO | Source: Ambulatory Visit | Attending: Obstetrics & Gynecology | Admitting: Obstetrics & Gynecology

## 2023-11-15 ENCOUNTER — Other Ambulatory Visit: Payer: Self-pay

## 2023-11-15 ENCOUNTER — Encounter (HOSPITAL_COMMUNITY): Payer: Self-pay | Admitting: Obstetrics & Gynecology

## 2023-11-15 ENCOUNTER — Telehealth: Payer: Self-pay | Admitting: Obstetrics and Gynecology

## 2023-11-15 ENCOUNTER — Inpatient Hospital Stay (HOSPITAL_COMMUNITY): Payer: BC Managed Care – PPO

## 2023-11-15 DIAGNOSIS — Z7901 Long term (current) use of anticoagulants: Secondary | ICD-10-CM

## 2023-11-15 DIAGNOSIS — N99820 Postprocedural hemorrhage and hematoma of a genitourinary system organ or structure following a genitourinary system procedure: Secondary | ICD-10-CM

## 2023-11-15 DIAGNOSIS — Z90710 Acquired absence of both cervix and uterus: Secondary | ICD-10-CM

## 2023-11-15 DIAGNOSIS — N3289 Other specified disorders of bladder: Secondary | ICD-10-CM | POA: Diagnosis not present

## 2023-11-15 DIAGNOSIS — E876 Hypokalemia: Secondary | ICD-10-CM

## 2023-11-15 LAB — CBC
HCT: 43.7 % (ref 36.0–46.0)
Hemoglobin: 15.8 g/dL — ABNORMAL HIGH (ref 12.0–15.0)
MCH: 32.8 pg (ref 26.0–34.0)
MCHC: 36.2 g/dL — ABNORMAL HIGH (ref 30.0–36.0)
MCV: 90.7 fL (ref 80.0–100.0)
Platelets: 272 10*3/uL (ref 150–400)
RBC: 4.82 MIL/uL (ref 3.87–5.11)
RDW: 11.9 % (ref 11.5–15.5)
WBC: 10.2 10*3/uL (ref 4.0–10.5)
nRBC: 0 % (ref 0.0–0.2)

## 2023-11-15 LAB — DIC (DISSEMINATED INTRAVASCULAR COAGULATION)PANEL
D-Dimer, Quant: <0.27 ug{FEU}/mL (ref 0.00–0.50)
Fibrinogen: 289 mg/dL (ref 210–475)
INR: 1 (ref 0.8–1.2)
Platelets: 279 10*3/uL (ref 150–400)
Prothrombin Time: 13.4 s (ref 11.4–15.2)
Smear Review: NONE SEEN
aPTT: 27 s (ref 24–36)

## 2023-11-15 LAB — COMPREHENSIVE METABOLIC PANEL
ALT: 34 U/L (ref 0–44)
AST: 23 U/L (ref 15–41)
Albumin: 3.9 g/dL (ref 3.5–5.0)
Alkaline Phosphatase: 47 U/L (ref 38–126)
Anion gap: 15 (ref 5–15)
BUN: 14 mg/dL (ref 6–20)
CO2: 22 mmol/L (ref 22–32)
Calcium: 9.5 mg/dL (ref 8.9–10.3)
Chloride: 101 mmol/L (ref 98–111)
Creatinine, Ser: 0.83 mg/dL (ref 0.44–1.00)
GFR, Estimated: >60 mL/min (ref >60)
Glucose, Bld: 88 mg/dL (ref 70–99)
Potassium: 2.7 mmol/L — CL (ref 3.5–5.1)
Sodium: 138 mmol/L (ref 135–145)
Total Bilirubin: 0.7 mg/dL (ref 0.0–1.2)
Total Protein: 7.2 g/dL (ref 6.5–8.1)

## 2023-11-15 LAB — MAGNESIUM: Magnesium: 1.8 mg/dL (ref 1.7–2.4)

## 2023-11-15 MED ORDER — POTASSIUM CHLORIDE CRYS ER 20 MEQ PO TBCR: 40.0000 meq | EXTENDED_RELEASE_TABLET | Freq: Every day | ORAL | Status: DC

## 2023-11-15 MED ORDER — POTASSIUM CHLORIDE CRYS ER 20 MEQ PO TBCR: 40.0000 meq | EXTENDED_RELEASE_TABLET | Freq: Every day | ORAL | 0 refills | Status: DC

## 2023-11-15 MED ORDER — POTASSIUM CHLORIDE CRYS ER 20 MEQ PO TBCR
40.0000 meq | EXTENDED_RELEASE_TABLET | Freq: Two times a day (BID) | ORAL | Status: DC
  Administered 2023-11-15: 40 meq via ORAL
  Filled 2023-11-15: qty 2

## 2023-11-15 NOTE — MAU Note (Addendum)
.  Jazsmine Macari is a 43 y.o. at Unknown here in MAU reporting: had hysterectomy on 12/23. Friday woke up around 0100 and noticed a large about of VB around her - continued to have bleeding. Was seen at [facility] - had abdominal US and was told by the provider if she did not have a transvaginal US she would not be seen, so she left. Has since been in contact with Dr. Berton Lan and was told to come to MAU for evaluation. Reports bleeding has slowed down today - pink/brown spotting when wiping. Also reports lower right sided abdominal pain "feels like a pulled muscle"  Pain score: 5 Vitals:   11/15/23 2032  BP: 131/86  Pulse: 96  Resp: 18  Temp: 97.8 F (36.6 C)  SpO2: 96%     FHT: NA   Lab orders placed from triage: none

## 2023-11-15 NOTE — Progress Notes (Signed)
Appointment rescheduled by patient

## 2023-11-15 NOTE — Telephone Encounter (Signed)
Received MyChart message that patient had an increase in vaginal bleeding this weekend. She is 4 weeks s/p TLH/BS with suspected cuff hematoma & cuff cellulitis s/p treatment with augmentin. She is on Eliquis for hx CVA. She reported bleeding through pad in 2 hours, was triaged by [facility] ED where transabdominal pelvic US showed no free fluid in the pelvis.   Called patient and verified ID x 2. Reports bleeding has slowed down but she has been staying in bed and minimizing physical activity. Is having some R sided pain. No fevers, chills, nausea or vomiting. She states she was told she wouldn't been seen at [facility] if she didn't complete the transvaginal component of the pelvic US.  Given previous severity of her bleeding, recommended she come to MAU for CBC, CT A/P w/ PO/IV contrast and speculum exam by MD to help assess for hematoma and r/o dehiscence. She is agreeable to plan. Charge RN & MAU team notified via secure chat. Overnight MD also notified.   Harvie Bridge, MD Obstetrician & Gynecologist, [facility] for [employer], [facility]

## 2023-11-15 NOTE — MAU Provider Note (Signed)
Faculty Practice OB/GYN Attending MAU Note  Chief Complaint: Vaginal Bleeding and Abdominal Pain   Event Date/Time   First Provider Initiated Contact with Patient 11/15/23 2109     SUBJECTIVE Linda [name] is a 43 y.o. [id number] s/p total laparoscopic hysterectomy on 10/12/23 who presents with vaginal bleeding.  Patient's history is remarkable for VTYE, currently anticoagulated (Xarelto). On 11/14/23, she noticed a lot of blood per vagina and went to [facility] ER.  On evaluation, she vitals were stable, WBC 7.5, hemoglobin was 16.3 and she was ordered for pelvic ultrasound.  Patient did not want the transvaginal component and left prior to undergoing any pelvic examination.  She reported that the bleeding slowed down overnight, but contacted her surgeon, who urged there to come in today to [facility] for further evaluation.  Today, she reports small amount of bleeding, mild abdominal pain. Denies any, fevers, chills, sweats, dysuria, nausea, vomiting, other GI or GU symptoms or other general symptoms.   Past Medical History:  Diagnosis Date   Abnormal uterine bleeding (AUB)    Anticoagulated    xarelto--- managed by pcp   Depression    Elevated homocysteine    after pt's surgery on 08-19-2023 pt stopped ASA 81mg  and started xarelto 10 mg (08-14-2023  per pt taking asa 81mg , not started xarelto yet d/t surgery) hematology/ oncology-- Roseanna Rainbow NP , evaluated d/t hx multiple suspected provoked DVTs , note in epic 07-17-2023 ,  pt changed to xarelto from asa 81mg    Fibromyalgia    GAD (generalized anxiety disorder)    GERD (gastroesophageal reflux disease)    History of abnormal cervical Pap smear    w/ HPV   History of DVT of lower extremity    per pt x2  DVT's  in 2012 both LLE ,  suspected provoked   History of hypokalemia    History of seizures 2015   x1 seizure,, normal CT , no EEG done (pt did not do), on meds for few months, then stopped , was evaluted by [facility] in [place] , no  seizure since   History of transient ischemic attack (TIA) 2013   unknown etiology per pt ,  none since   Hyperlipidemia, mixed    Hypertension    IBS (irritable bowel syndrome)    Interstitial cystitis    per pt followed IC diet   Migraine    Polycythemia    PVC's (premature ventricular contractions)    cardiologist--- dr Melburn Popper   Recurrent UTI    recently started antibiotic on 10-06-2023   Wears contact lenses    OB History  Gravida Para Term Preterm AB Living  4 2   2 2   SAB IAB Ectopic Multiple Live Births  2        # Outcome Date GA Lbr Len/2nd Weight Sex Type Anes PTL Lv  4 SAB           3 SAB           2 Para           1 Para            Past Surgical History:  Procedure Laterality Date   CARPAL TUNNEL RELEASE Right 1999   CESAREAN SECTION     x2  2009;   last one @ [facility] 07-25-2016  w/ Bilateral Tubal Ligation   CYSTOSCOPY N/A 10/12/2023   Procedure: CYSTOSCOPY;  Surgeon: Lennart Pall, MD;  Location: [facility];  Service: Gynecology;  Laterality:  N/A;   CYSTOSCOPY WITH HYDRODISTENSION AND BIOPSY  01/05/2004   @[facility]  by dr Elvera Lennox. evans; w/ instillation therapy &   Insertion of first stage interstim implant   DILATION AND CURETTAGE OF UTERUS  10/26/2010   @[facility]  ;   w/ suction for blighted ovum   HYSTEROSCOPY WITH D & C N/A 08/19/2023   Procedure: DILATATION AND CURETTAGE /HYSTEROSCOPY;  Surgeon: Lennart Pall, MD;  Location: [facility];  Service: Gynecology;  Laterality: N/A;   INTERSTIM IMPLANT REMOVAL  02/2009   Dr Sherron Monday   TOTAL LAPAROSCOPIC HYSTERECTOMY WITH SALPINGECTOMY Bilateral 10/12/2023   Procedure: TOTAL LAPAROSCOPIC HYSTERECTOMY WITH BILATERAL SALPINGECTOMY;  Surgeon: Lennart Pall, MD;  Location: [facility];  Service: Gynecology;  Laterality: Bilateral;   TRANSANAL RECTAL RESECTION  03/04/2022   @[facility];   rectal mass   Social History   Socioeconomic History   Marital status:  Single    Spouse name: Not on file   Number of children: Not on file   Years of education: Not on file   Highest education level: Not on file  Occupational History   Not on file  Tobacco Use   Smoking status: Former    Types: Cigarettes    Start date: 2009    Quit date: 2003    Years since quitting: 22.0   Smokeless tobacco: Never   Tobacco comments:    08-14-2023  per pt quit smoking age 72 (2009), started age 79 (2003)  Vaping Use   Vaping status: Never Used  Substance and Sexual Activity   Alcohol use: Yes    Comment: occasionally   Drug use: Not Currently    Comment: per pt last used age 55s   Sexual activity: Not Currently    Partners: Male    Birth control/protection: Surgical  Other Topics Concern   Not on file  Social History Narrative   Not on file   Social Drivers of Health   Financial Resource Strain: Not on file  Food Insecurity: No Food Insecurity (02/04/2022)   [facility] from [facility], [facility]   Hunger Vital Sign    Worried About Running Out of Food in the Last Year: Never true    Ran Out of Food in the Last Year: Never true  Transportation Needs: Not on file  Physical Activity: Not on file  Stress: No Stress Concern Present (03/07/2022)   Received from [facility], [facility]   [employer] [facility] - Occupational Stress Questionnaire    Feeling of Stress : Not at all  Social Connections: Unknown (02/17/2022)   Received from [facility], [facility]   Social Network    Social Network: Not on file  Intimate Partner Violence: Not At Risk (11/04/2023)   Received from [facility]   HITS    Over the last 12 months how often did your partner physically hurt you?: Never    Over the last 12 months how often did your partner insult you or talk down to you?: Never    Over the last 12 months how often did your partner threaten you with physical harm?: Never    Over the last 12 months how often did your partner scream or  curse at you?: Never   No current facility-administered medications on file prior to encounter.   Current Outpatient Medications on File Prior to Encounter  Medication Sig Dispense Refill   acetaminophen (TYLENOL) 500 MG tablet Take 2 tablets (1,000 mg total) by mouth  every 8 (eight) hours. 60 tablet 0   chlorthalidone (HYGROTON) 25 MG tablet Take 1 tablet (25 mg total) by mouth at bedtime. 90 tablet 1   meloxicam (MOBIC) 15 MG tablet ONE TABLET EVERY MORNING WITH A MEAL FOR 2 WEEKS, THEN DAILY AS NEEDED FOR PAIN. (Patient taking differently: Take 15 mg by mouth at bedtime. ONE TABLET EVERY MORNING WITH A MEAL FOR 2 WEEKS, THEN DAILY AS NEEDED FOR PAIN.) 90 tablet 1   pantoprazole (PROTONIX) 40 MG tablet Take 1 tablet (40 mg total) by mouth at bedtime. 90 tablet 1   rivaroxaban (XARELTO) 10 MG TABS tablet Take 10 mg by mouth at bedtime.     topiramate (TOPAMAX) 200 MG tablet Take 1 tablet (200 mg total) by mouth at bedtime. (Patient taking differently: Take 200 mg by mouth at bedtime.) 90 tablet 1   venlafaxine XR (EFFEXOR XR) 75 MG 24 hr capsule Take 1 capsule (75 mg total) by mouth daily with breakfast. (Patient taking differently: Take 75 mg by mouth at bedtime.) 90 capsule 1   WEGOVY 2.4 MG/0.75ML SOAJ Inject 2.4 mg into the skin once a week. (Patient taking differently: Inject 2.4 mg into the skin once a week. Thursday's) 9 mL 3   amoxicillin-clavulanate (AUGMENTIN) 875-125 MG tablet Take 1 tablet by mouth 2 (two) times daily. 20 tablet 0   clotrimazole-betamethasone (LOTRISONE) cream Apply 1 Application topically 2 (two) times daily. Use for up to 14 days. 60 g 0   nitrofurantoin, macrocrystal-monohydrate, (MACROBID) 100 MG capsule Take 1 capsule (100 mg total) by mouth 2 (two) times daily. (Patient not taking: Reported on 11/15/2023) 10 capsule 0   ondansetron (ZOFRAN-ODT) 8 MG disintegrating tablet Take 1 tablet (8 mg total) by mouth every 8 (eight) hours as needed for nausea. 30 tablet 1    oxyCODONE (OXY IR/ROXICODONE) 5 MG immediate release tablet Take 1 tablet (5 mg total) by mouth every 4 (four) hours as needed for breakthrough pain. 15 tablet 0   oxyCODONE (ROXICODONE) 5 MG immediate release tablet Take 1 tablet (5 mg total) by mouth every 4 (four) hours as needed for breakthrough pain. 15 tablet 0   phenazopyridine (PYRIDIUM) 200 MG tablet Take 1 tablet (200 mg total) by mouth 3 (three) times daily as needed for pain. 10 tablet 0   senna (SENOKOT) 8.6 MG TABS tablet Take 2 tablets (17.2 mg total) by mouth at bedtime as needed for mild constipation. 30 tablet 0   Allergies  Allergen Reactions   Chocolate Anaphylaxis   Cocoa Butter Anaphylaxis   Iodinated Contrast Media Anaphylaxis   Propoxyphene Itching   Sulfa Antibiotics Other (See Comments)    Yeast infection   Codeine Hives and Itching   Lisinopril Cough   Oxycodone Other (See Comments)    Other reaction(s): Hallucinations    ROS: Pertinent items in HPI  OBJECTIVE BP 131/86 (BP Location: Right Arm)   Pulse 96   Temp 97.8 F (36.6 C) (Oral)   Resp 18   Ht 5\' 5"  (1.651 m)   Wt 80.6 kg   LMP 06/03/2023   SpO2 96%   BMI 29.57 kg/m  CONSTITUTIONAL: Well-developed, well-nourished female in no acute distress.  SKIN: Skin is warm and dry. No rash noted. Not diaphoretic. No erythema. No pallor. NEUROLGIC: Alert and oriented to person, place, and time. Normal reflexes, muscle tone coordination. No cranial nerve deficit noted. PSYCHIATRIC: Normal mood and affect. Normal behavior. Normal judgment and thought content. CARDIOVASCULAR: Normal heart rate noted RESPIRATORY: Effort  and breath sounds normal, no problems with respiration noted. ABDOMEN: Soft, normal bowel sounds, no distention noted.  No tenderness, rebound or guarding.  PELVIC: Normal appearing external genitalia; normal appearing vaginal mucosa. Vaginal cuff appears well healed and intact, no defects noted after probing with sterile cotton swabs, no  erythema, no active bleeding noted, small amount of dark-brown discharge noted in vault.     MUSCULOSKELETAL: Normal range of motion. No tenderness.  No cyanosis, clubbing, or edema.  2+ distal pulses.  LAB RESULTS Results for orders placed or performed during the hospital encounter of 11/15/23 (from the past 48 hours)  CBC     Status: Abnormal   Collection Time: 11/15/23  8:38 PM  Result Value Ref Range   WBC 10.2 4.0 - 10.5 K/uL   RBC 4.82 3.87 - 5.11 MIL/uL   Hemoglobin 15.8 (H) 12.0 - 15.0 g/dL   HCT 14.7 82.9 - 56.2 %   MCV 90.7 80.0 - 100.0 fL   MCH 32.8 26.0 - 34.0 pg   MCHC 36.2 (H) 30.0 - 36.0 g/dL   RDW 13.0 86.5 - 78.4 %   Platelets 272 150 - 400 K/uL   nRBC 0.0 0.0 - 0.2 %    Comment: Performed at [facility], [address], [place], [place] [address]  Comprehensive metabolic panel     Status: Abnormal   Collection Time: 11/15/23  8:38 PM  Result Value Ref Range   Sodium 138 135 - 145 mmol/L   Potassium 2.7 (LL) 3.5 - 5.1 mmol/L    Comment: CRITICAL RESULT CALLED TO, READ BACK BY AND VERIFIED WITH R.FANG RN 2202 11/15/2023 BY G.GANADEN   Chloride 101 98 - 111 mmol/L   CO2 22 22 - 32 mmol/L   Glucose, Bld 88 70 - 99 mg/dL    Comment: Glucose reference range applies only to samples taken after fasting for at least 8 hours.   BUN 14 6 - 20 mg/dL   Creatinine, Ser 5.28 0.44 - 1.00 mg/dL   Calcium 9.5 8.9 - 41.3 mg/dL   Total Protein 7.2 6.5 - 8.1 g/dL   Albumin 3.9 3.5 - 5.0 g/dL   AST 23 15 - 41 U/L   ALT 34 0 - 44 U/L   Alkaline Phosphatase 47 38 - 126 U/L   Total Bilirubin 0.7 0.0 - 1.2 mg/dL   GFR, Estimated >24 >40 mL/min    Comment: (NOTE) Calculated using the CKD-EPI Creatinine Equation (2021)    Anion gap 15 5 - 15    Comment: Performed at [facility], [address], [place], [place] [address]  DIC Panel ONCE - STAT     Status: None   Collection Time: 11/15/23  8:38 PM  Result Value Ref Range   Prothrombin Time 13.4 11.4 - 15.2 seconds    INR 1.0 0.8 - 1.2    Comment: (NOTE) INR goal varies based on device and disease states.    aPTT 27 24 - 36 seconds   Fibrinogen 289 210 - 475 mg/dL    Comment: (NOTE) Fibrinogen results may be underestimated in patients receiving thrombolytic therapy.    D-Dimer, Quant <0.27 0.00 - 0.50 ug/mL-FEU    Comment: (NOTE) At the manufacturer cut-off value of 0.5 g/mL FEU, this assay has a negative predictive value of 95-100%.This assay is intended for use in conjunction with a clinical pretest probability (PTP) assessment model to exclude pulmonary embolism (PE) and deep venous thrombosis (DVT) in outpatients suspected of  PE or DVT. Results should be correlated with clinical presentation.    Platelets 279 150 - 400 K/uL   Smear Review NO SCHISTOCYTES SEEN     Comment: Performed at [facility], [address] [place], [place] [address]  Magnesium     Status: None   Collection Time: 11/15/23  8:38 PM  Result Value Ref Range   Magnesium 1.8 1.7 - 2.4 mg/dL    Comment: Performed at [facility], [address], [place], [place] [address]    IMAGING CT ABDOMEN PELVIS WO CONTRAST Result Date: 11/15/2023 CLINICAL DATA:  History of prior hysterectomy 3 weeks ago with vaginal bleeding, initial encounter EXAM: CT ABDOMEN AND PELVIS WITHOUT CONTRAST TECHNIQUE: Multidetector CT imaging of the abdomen and pelvis was performed following the standard protocol without IV contrast. RADIATION DOSE REDUCTION: This exam was performed according to the departmental dose-optimization program which includes automated exposure control, adjustment of the mA and/or kV according to patient size and/or use of iterative reconstruction technique. COMPARISON:  Ultrasound from the previous day. FINDINGS: Lower chest: No acute abnormality. Hepatobiliary: No focal liver abnormality is seen. No gallstones, gallbladder wall thickening, or biliary dilatation. Pancreas: Unremarkable. No pancreatic ductal dilatation  or surrounding inflammatory changes. Spleen: Normal in size without focal abnormality. Adrenals/Urinary Tract: Adrenal glands are within normal limits. No renal calculi or obstructive changes are noted. The bladder is partially distended. Stomach/Bowel: No obstructive or inflammatory changes of the colon are seen. The appendix is air-filled and within normal limits. Small bowel and stomach are unremarkable. Vascular/Lymphatic: Aortic atherosclerosis. No enlarged abdominal or pelvic lymph nodes. Reproductive: Uterus has been surgically removed. No adnexal mass is seen. No pelvic hematoma is noted. Other: No abdominal wall hernia or abnormality. No abdominopelvic ascites. Musculoskeletal: No acute or significant osseous findings. IMPRESSION: No evidence of pelvic hematoma to correspond with the given clinical history. Electronically Signed   By: Alcide Clever M.D.   On: 11/15/2023 23:11   US PELVIS (TRANSABDOMINAL ONLY) Result Date: 11/14/2023 CLINICAL DATA:  Right lower quadrant abdominal pain. Patient is status post hysterectomy 3 weeks ago. EXAM: TRANSABDOMINAL ULTRASOUND OF PELVIS TECHNIQUE: Transabdominal ultrasound examination of the pelvis was performed including evaluation of the uterus, ovaries, adnexal regions, and pelvic cul-de-sac. COMPARISON:  05/20/2023 FINDINGS: Uterus Surgically absent. Endometrium Thickness: N/A. Right ovary Obscured by overlying bowel gas Left ovary Obscured by overlying bowel gas Other findings:  No abnormal free fluid. IMPRESSION: 1. Status post hysterectomy. 2. Nonvisualization of the ovaries due to overlying bowel gas. 3. No free fluid in the pelvis. 4. Extraperitoneal postoperative hematoma can be occult on ultrasound imaging. Mesenteric abscess could also be occult. CT imaging of the abdomen and pelvis with oral and intravenous contrast may prove helpful to further evaluate as clinically warranted. Electronically Signed   By: Kennith Center M.D.   On: 11/14/2023 05:53     MAU COURSE Examination was reassuring, no concerning findings Labs and CT scan ordered, will follow up results and manage accordingly. Hypokalemia K 2.7 noted, oral potassium repletion ordered, normal magnesium ordered. CT Scan negative for hematoma.  ASSESSMENT 1. Postoperative vaginal bleeding following genitourinary procedure   2. S/P laparoscopic hysterectomy   3. Anticoagulant long-term use   4. Hypokalemia     PLAN CT scan was negative for hematoma of the vaginal cuff, this was reassuring and results discussed with patient.  She may have had a small hematoma but likely resolved after the bleeding she had on 11/14/23.  Has  follow up appointment with Dr. Berton Lan on 11/26/23. Oral potassium repletion ordered for hypokalemia No other concerns at this point. Bleeding precautions ordered. Discharged to home in stable condition.  Allergies as of 11/15/2023       Reactions   Chocolate Anaphylaxis   Cocoa Butter Anaphylaxis   Iodinated Contrast Media Anaphylaxis   Propoxyphene Itching   Sulfa Antibiotics Other (See Comments)   Yeast infection   Codeine Hives, Itching   Lisinopril Cough   Oxycodone Other (See Comments)   Other reaction(s): Hallucinations        Medication List     TAKE these medications    acetaminophen 500 MG tablet Commonly known as: TYLENOL Take 2 tablets (1,000 mg total) by mouth every 8 (eight) hours.   amoxicillin-clavulanate 875-125 MG tablet Commonly known as: AUGMENTIN Take 1 tablet by mouth 2 (two) times daily.   chlorthalidone 25 MG tablet Commonly known as: HYGROTON Take 1 tablet (25 mg total) by mouth at bedtime.   clotrimazole-betamethasone cream Commonly known as: LOTRISONE Apply 1 Application topically 2 (two) times daily. Use for up to 14 days.   meloxicam 15 MG tablet Commonly known as: MOBIC ONE TABLET EVERY MORNING WITH A MEAL FOR 2 WEEKS, THEN DAILY AS NEEDED FOR PAIN. What changed: See the new instructions.    nitrofurantoin (macrocrystal-monohydrate) 100 MG capsule Commonly known as: MACROBID Take 1 capsule (100 mg total) by mouth 2 (two) times daily.   ondansetron 8 MG disintegrating tablet Commonly known as: ZOFRAN-ODT Take 1 tablet (8 mg total) by mouth every 8 (eight) hours as needed for nausea.   oxyCODONE 5 MG immediate release tablet Commonly known as: Roxicodone Take 1 tablet (5 mg total) by mouth every 4 (four) hours as needed for breakthrough pain.   oxyCODONE 5 MG immediate release tablet Commonly known as: Oxy IR/ROXICODONE Take 1 tablet (5 mg total) by mouth every 4 (four) hours as needed for breakthrough pain.   pantoprazole 40 MG tablet Commonly known as: PROTONIX Take 1 tablet (40 mg total) by mouth at bedtime.   phenazopyridine 200 MG tablet Commonly known as: Pyridium Take 1 tablet (200 mg total) by mouth 3 (three) times daily as needed for pain.   potassium chloride SA 20 MEQ tablet Commonly known as: KLOR-CON M Take 2 tablets (40 mEq total) by mouth daily for 5 days.   rivaroxaban 10 MG Tabs tablet Commonly known as: XARELTO Take 10 mg by mouth at bedtime.   senna 8.6 MG Tabs tablet Commonly known as: SENOKOT Take 2 tablets (17.2 mg total) by mouth at bedtime as needed for mild constipation.   topiramate 200 MG tablet Commonly known as: TOPAMAX Take 1 tablet (200 mg total) by mouth at bedtime.   venlafaxine XR 75 MG 24 hr capsule Commonly known as: Effexor XR Take 1 capsule (75 mg total) by mouth daily with breakfast. What changed: when to take this   Wegovy 2.4 MG/0.75ML Soaj Generic drug: Semaglutide-Weight Management Inject 2.4 mg into the skin once a week. What changed: additional instructions        Evaluation does not show pathology that would require ongoing emergent intervention or inpatient treatment. Patient is hemodynamically stable and mentating appropriately. Discussed findings and plan with patient, who agrees with care plan. All  questions answered. Return precautions discussed and outpatient follow up recommendations given.  Tereso Newcomer, MD 11/15/2023 11:56 PM

## 2023-11-18 ENCOUNTER — Encounter: Payer: BC Managed Care – PPO | Admitting: Obstetrics and Gynecology

## 2023-11-25 NOTE — Progress Notes (Addendum)
   POST OPERATIVE GYNECOLOGY VISIT  Subjective:  Linda [name] is a 43 y.o. [id number] 6 weeks s/p TLH/BS/cysto presenting for post op appt  Post op course has been complicated by suspected cuff hematoma and cellulitis. She completed a course of augmentin  in January. She was seen in MAU on 1/26 after an episode of heavy vaginal bleeding. Exam by MAU provider was reassuring. CBC 15.8, consistent with baseline. CT A/P reassuring with no acute intraabdominal process, no evidence of cuff hematoma.   Today, she reports persistent pain on the right side but manageable. No more vaginal bleeding.   Reviewed path & intra op images Path benign uterus, cervix, & tubes Images notable for diffuse superficial endometriosis, discoloration of rectum/omentum suspected to be related to prior surgery versus ?endometriosis  Objective:   Vitals:   11/26/23 0948  BP: (!) 130/93  Pulse: 93  Weight: 180 lb (81.6 kg)  Height: 5' 5 (1.651 m)   General:  Alert, oriented and cooperative. Patient is in no acute distress.  Skin: Skin is warm and dry. No rash noted.   Cardiovascular: Normal heart rate noted  Respiratory: Normal respiratory effort, no problems with respiration noted  Abdomen: Soft, non-tender, non-distended   Pelvic: NEFG. Cuff visually intact, no suture material visualized. Thin white physiologic discharge present.   Exam performed in the presence of a chaperone  Assessment and Plan:  Linda [name] is a 43 y.o. with 6 weeks s/p TLH/BS/cysto presenting for post op appt  Recovering more smoothly in the past 1-2 weeks No further lifting restrictions, OK for tub baths Nothing in the vagina for 6 additional weeks Will review intra op images with surgery   Return in about 6 weeks (around 01/07/2024) for cuff check.  Future Appointments  Date Time Provider Department Center  01/06/2024  1:30 PM Erik Kieth BROCKS, MD [facility]  03/07/2024 10:10 AM Linda Mini, NP [facility] None   Kieth BROCKS Erik, MD

## 2023-11-26 ENCOUNTER — Ambulatory Visit: Payer: BC Managed Care – PPO | Admitting: Obstetrics and Gynecology

## 2023-11-26 ENCOUNTER — Encounter: Payer: Self-pay | Admitting: Obstetrics and Gynecology

## 2023-11-26 VITALS — BP 130/93 | HR 93 | Ht 65.0 in | Wt 180.0 lb

## 2023-11-26 DIAGNOSIS — Z09 Encounter for follow-up examination after completed treatment for conditions other than malignant neoplasm: Secondary | ICD-10-CM

## 2023-12-07 ENCOUNTER — Encounter: Payer: Self-pay | Admitting: Obstetrics and Gynecology

## 2023-12-09 ENCOUNTER — Other Ambulatory Visit: Payer: Self-pay | Admitting: Cardiovascular Disease

## 2023-12-09 DIAGNOSIS — I10 Essential (primary) hypertension: Secondary | ICD-10-CM

## 2023-12-09 DIAGNOSIS — I493 Ventricular premature depolarization: Secondary | ICD-10-CM

## 2023-12-18 ENCOUNTER — Telehealth: Payer: BC Managed Care – PPO | Admitting: Family Medicine

## 2023-12-18 ENCOUNTER — Telehealth: Payer: BC Managed Care – PPO | Admitting: Physician Assistant

## 2023-12-18 ENCOUNTER — Encounter: Payer: Self-pay | Admitting: Medical-Surgical

## 2023-12-18 DIAGNOSIS — B9689 Other specified bacterial agents as the cause of diseases classified elsewhere: Secondary | ICD-10-CM | POA: Diagnosis not present

## 2023-12-18 DIAGNOSIS — J208 Acute bronchitis due to other specified organisms: Secondary | ICD-10-CM

## 2023-12-18 MED ORDER — PREDNISONE 20 MG PO TABS: 40.0000 mg | ORAL_TABLET | Freq: Every day | ORAL | 0 refills | Status: DC

## 2023-12-18 MED ORDER — BENZONATATE 100 MG PO CAPS: 100.0000 mg | ORAL_CAPSULE | Freq: Three times a day (TID) | ORAL | 0 refills | Status: DC | PRN

## 2023-12-18 MED ORDER — PROMETHAZINE-DM 6.25-15 MG/5ML PO SYRP: 5.0000 mL | ORAL_SOLUTION | Freq: Four times a day (QID) | ORAL | 0 refills | Status: DC | PRN

## 2023-12-18 MED ORDER — AZITHROMYCIN 250 MG PO TABS: ORAL_TABLET | ORAL | 0 refills | Status: AC

## 2023-12-18 NOTE — Telephone Encounter (Signed)
 Patient advised to schedule a virtual urgent care visit.

## 2023-12-18 NOTE — Progress Notes (Signed)
 Virtual Visit Consent   Linda [name], you are scheduled for a virtual visit with a  provider today. Just as with appointments in the office, your consent must be obtained to participate. Your consent will be active for this visit and any virtual visit you may have with one of our providers in the next 365 days. If you have a MyChart account, a copy of this consent can be sent to you electronically.  As this is a virtual visit, video technology does not allow for your provider to perform a traditional examination. This may limit your provider's ability to fully assess your condition. If your provider identifies any concerns that need to be evaluated in person or the need to arrange testing (such as labs, EKG, etc.), we will make arrangements to do so. Although advances in technology are sophisticated, we cannot ensure that it will always work on either your end or our end. If the connection with a video visit is poor, the visit may have to be switched to a telephone visit. With either a video or telephone visit, we are not always able to ensure that we have a secure connection.  By engaging in this virtual visit, you consent to the provision of healthcare and authorize for your insurance to be billed (if applicable) for the services provided during this visit. Depending on your insurance coverage, you may receive a charge related to this service.  I need to obtain your verbal consent now. Are you willing to proceed with your visit today? Linda [name] has provided verbal consent on 12/18/2023 for a virtual visit (video or telephone). Linda Loveless, PA-C  Date: 12/18/2023 8:53 AM   Virtual Visit via Video Note   I, Linda [name], connected with  Linda [name]  ([id number], May 20, 1981) on 12/18/23 at  8:45 AM EST by a video-enabled telemedicine application and verified that I am speaking with the correct person using two identifiers.  Location: Patient: Virtual Visit Location Patient:  Home Provider: Virtual Visit Location Provider: Home Office   I discussed the limitations of evaluation and management by telemedicine and the availability of in person appointments. The patient expressed understanding and agreed to proceed.    History of Present Illness: Linda [name] is a 43 y.o. who identifies as a female who was assigned female at birth, and is being seen today for cough.  HPI: URI  This is a new problem. The current episode started 1 to 4 weeks ago. The problem has been gradually worsening. The maximum temperature recorded prior to her arrival was 101 - 101.9 F. The fever has been present for 3 to 4 days. Associated symptoms include chest pain (burning), congestion, coughing (dry, hacking), headaches and a sore throat. Pertinent negatives include no diarrhea, ear pain, nausea, plugged ear sensation, rhinorrhea, sinus pain, vomiting or wheezing. Treatments tried: delsym, nyquil. The treatment provided no relief.     Problems:  Patient Active Problem List   Diagnosis Date Noted   S/P laparoscopic hysterectomy 10/12/2023   Dysfunctional uterine bleeding 08/19/2023   History of migraine 08/29/2022   Burning sensation of skin 03/24/2022   Pruritus of skin 03/24/2022   Axillary lymphadenopathy 03/24/2022   Perirectal abscess 03/07/2022   Daily consumption of alcohol 02/19/2022   Hypokalemia 02/19/2022   Rectal mass 02/04/2022   External hemorrhoid 01/20/2022   Interstitial cystitis 01/13/2022   Shingles outbreak 01/13/2022   Fracture of calcaneus, left, closed 12/19/2019   Migraine with aura and without status migrainosus, not intractable  12/06/2019   History of stroke 12/06/2019   History of DVT of lower extremity 12/06/2019   Chest pain 12/06/2019   Essential hypertension 12/06/2019   Menorrhagia with irregular cycle 08/05/2019   Pap smear of cervix shows high risk HPV present 09/09/2016   S/P tubal ligation 09/02/2016   Vitamin D deficiency 08/04/2016   S/P  cesarean section 07/27/2016   Seizure (HCC) 01/02/2016   Family history of genetic disease 12/28/2015   [name] (generalized anxiety disorder) 05/01/2015   ADD (attention deficit disorder) 05/01/2015   Fibromyalgia 09/27/2012   IBS (irritable bowel syndrome) 01/01/2011   Palpitations 03/07/2010    Allergies:  Allergies  Allergen Reactions   Chocolate Anaphylaxis   Cocoa Butter Anaphylaxis   Iodinated Contrast Media Anaphylaxis   Propoxyphene Itching   Sulfa Antibiotics Other (See Comments)    Yeast infection   Codeine Hives and Itching   Lisinopril Cough   Oxycodone Other (See Comments)    Other reaction(s): Hallucinations   Medications:  Current Outpatient Medications:    azithromycin (ZITHROMAX) 250 MG tablet, Take 2 tablets on day 1, then 1 tablet daily on days 2 through 5, Disp: 6 tablet, Rfl: 0   benzonatate (TESSALON) 100 MG capsule, Take 1-2 capsules (100-200 mg total) by mouth 3 (three) times daily as needed., Disp: 30 capsule, Rfl: 0   predniSONE (DELTASONE) 20 MG tablet, Take 2 tablets (40 mg total) by mouth daily with breakfast., Disp: 10 tablet, Rfl: 0   promethazine-dextromethorphan (PROMETHAZINE-DM) 6.25-15 MG/5ML syrup, Take 5 mLs by mouth 4 (four) times daily as needed., Disp: 118 mL, Rfl: 0   acetaminophen (TYLENOL) 500 MG tablet, Take 2 tablets (1,000 mg total) by mouth every 8 (eight) hours., Disp: 60 tablet, Rfl: 0   chlorthalidone (HYGROTON) 25 MG tablet, Take 1 tablet (25 mg total) by mouth at bedtime., Disp: 90 tablet, Rfl: 1   meloxicam (MOBIC) 15 MG tablet, ONE TABLET EVERY MORNING WITH A MEAL FOR 2 WEEKS, THEN DAILY AS NEEDED FOR PAIN. (Patient taking differently: Take 15 mg by mouth at bedtime. ONE TABLET EVERY MORNING WITH A MEAL FOR 2 WEEKS, THEN DAILY AS NEEDED FOR PAIN.), Disp: 90 tablet, Rfl: 1   ondansetron (ZOFRAN-ODT) 8 MG disintegrating tablet, Take 1 tablet (8 mg total) by mouth every 8 (eight) hours as needed for nausea., Disp: 30 tablet, Rfl: 1    pantoprazole (PROTONIX) 40 MG tablet, Take 1 tablet (40 mg total) by mouth at bedtime., Disp: 90 tablet, Rfl: 1   potassium chloride SA (KLOR-CON M) 20 MEQ tablet, Take 2 tablets (40 mEq total) by mouth daily for 5 days., Disp: 10 tablet, Rfl: 0   rivaroxaban (XARELTO) 10 MG TABS tablet, Take 10 mg by mouth at bedtime., Disp: , Rfl:    senna (SENOKOT) 8.6 MG TABS tablet, Take 2 tablets (17.2 mg total) by mouth at bedtime as needed for mild constipation., Disp: 30 tablet, Rfl: 0   topiramate (TOPAMAX) 200 MG tablet, Take 1 tablet (200 mg total) by mouth at bedtime. (Patient taking differently: Take 200 mg by mouth at bedtime.), Disp: 90 tablet, Rfl: 1   venlafaxine XR (EFFEXOR XR) 75 MG 24 hr capsule, Take 1 capsule (75 mg total) by mouth daily with breakfast. (Patient taking differently: Take 75 mg by mouth at bedtime.), Disp: 90 capsule, Rfl: 1   WEGOVY 2.4 MG/0.75ML SOAJ, Inject 2.4 mg into the skin once a week. (Patient taking differently: Inject 2.4 mg into the skin once a week. Thursday's), Disp: 9  mL, Rfl: 3  Observations/Objective: Patient is well-developed, well-nourished in no acute distress.  Resting comfortably at home.  Head is normocephalic, atraumatic.  No labored breathing.  Speech is clear and coherent with logical content.  Patient is alert and oriented at baseline.    Assessment and Plan: 1. Acute bacterial bronchitis (Primary) - azithromycin (ZITHROMAX) 250 MG tablet; Take 2 tablets on day 1, then 1 tablet daily on days 2 through 5  Dispense: 6 tablet; Refill: 0 - predniSONE (DELTASONE) 20 MG tablet; Take 2 tablets (40 mg total) by mouth daily with breakfast.  Dispense: 10 tablet; Refill: 0 - promethazine-dextromethorphan (PROMETHAZINE-DM) 6.25-15 MG/5ML syrup; Take 5 mLs by mouth 4 (four) times daily as needed.  Dispense: 118 mL; Refill: 0 - benzonatate (TESSALON) 100 MG capsule; Take 1-2 capsules (100-200 mg total) by mouth 3 (three) times daily as needed.  Dispense: 30  capsule; Refill: 0  - Worsening over a week despite OTC medications - Will treat with Z-pack, Prednisone, Promethazine DM (drowsiness precautions discussed) and tessalon perles - Can continue Mucinex (PLAIN) - Push fluids.  - Rest.  - Steam and humidifier can help - Seek in person evaluation if worsening or symptoms fail to improve    Follow Up Instructions: I discussed the assessment and treatment plan with the patient. The patient was provided an opportunity to ask questions and all were answered. The patient agreed with the plan and demonstrated an understanding of the instructions.  A copy of instructions were sent to the patient via MyChart unless otherwise noted below.    The patient was advised to call back or seek an in-person evaluation if the symptoms worsen or if the condition fails to improve as anticipated.    Linda Loveless, PA-C

## 2023-12-18 NOTE — Patient Instructions (Signed)
 Linda [name], thank you for joining Linda Loveless, PA-C for today's virtual visit.  While this provider is not your primary care provider (PCP), if your PCP is located in our provider database this encounter information will be shared with them immediately following your visit.   A [facility] MyChart account gives you access to today's visit and all your visits, tests, and labs performed at [facility] " click here if you don't have a [facility] MyChart account or go to [email or web address]  Consent: (Patient) Linda [name] provided verbal consent for this virtual visit at the beginning of the encounter.  Current Medications:  Current Outpatient Medications:    azithromycin (ZITHROMAX) 250 MG tablet, Take 2 tablets on day 1, then 1 tablet daily on days 2 through 5, Disp: 6 tablet, Rfl: 0   benzonatate (TESSALON) 100 MG capsule, Take 1-2 capsules (100-200 mg total) by mouth 3 (three) times daily as needed., Disp: 30 capsule, Rfl: 0   predniSONE (DELTASONE) 20 MG tablet, Take 2 tablets (40 mg total) by mouth daily with breakfast., Disp: 10 tablet, Rfl: 0   promethazine-dextromethorphan (PROMETHAZINE-DM) 6.25-15 MG/5ML syrup, Take 5 mLs by mouth 4 (four) times daily as needed., Disp: 118 mL, Rfl: 0   acetaminophen (TYLENOL) 500 MG tablet, Take 2 tablets (1,000 mg total) by mouth every 8 (eight) hours., Disp: 60 tablet, Rfl: 0   chlorthalidone (HYGROTON) 25 MG tablet, Take 1 tablet (25 mg total) by mouth at bedtime., Disp: 90 tablet, Rfl: 1   meloxicam (MOBIC) 15 MG tablet, ONE TABLET EVERY MORNING WITH A MEAL FOR 2 WEEKS, THEN DAILY AS NEEDED FOR PAIN. (Patient taking differently: Take 15 mg by mouth at bedtime. ONE TABLET EVERY MORNING WITH A MEAL FOR 2 WEEKS, THEN DAILY AS NEEDED FOR PAIN.), Disp: 90 tablet, Rfl: 1   ondansetron (ZOFRAN-ODT) 8 MG disintegrating tablet, Take 1 tablet (8 mg total) by mouth every 8 (eight) hours as needed for nausea., Disp: 30 tablet, Rfl: 1    pantoprazole (PROTONIX) 40 MG tablet, Take 1 tablet (40 mg total) by mouth at bedtime., Disp: 90 tablet, Rfl: 1   potassium chloride SA (KLOR-CON M) 20 MEQ tablet, Take 2 tablets (40 mEq total) by mouth daily for 5 days., Disp: 10 tablet, Rfl: 0   rivaroxaban (XARELTO) 10 MG TABS tablet, Take 10 mg by mouth at bedtime., Disp: , Rfl:    senna (SENOKOT) 8.6 MG TABS tablet, Take 2 tablets (17.2 mg total) by mouth at bedtime as needed for mild constipation., Disp: 30 tablet, Rfl: 0   topiramate (TOPAMAX) 200 MG tablet, Take 1 tablet (200 mg total) by mouth at bedtime. (Patient taking differently: Take 200 mg by mouth at bedtime.), Disp: 90 tablet, Rfl: 1   venlafaxine XR (EFFEXOR XR) 75 MG 24 hr capsule, Take 1 capsule (75 mg total) by mouth daily with breakfast. (Patient taking differently: Take 75 mg by mouth at bedtime.), Disp: 90 capsule, Rfl: 1   WEGOVY 2.4 MG/0.75ML SOAJ, Inject 2.4 mg into the skin once a week. (Patient taking differently: Inject 2.4 mg into the skin once a week. Thursday's), Disp: 9 mL, Rfl: 3   Medications ordered in this encounter:  Meds ordered this encounter  Medications   azithromycin (ZITHROMAX) 250 MG tablet    Sig: Take 2 tablets on day 1, then 1 tablet daily on days 2 through 5    Dispense:  6 tablet    Refill:  0    Supervising Provider:   Leonides Grills,  PHILIP O [[id number]]   predniSONE (DELTASONE) 20 MG tablet    Sig: Take 2 tablets (40 mg total) by mouth daily with breakfast.    Dispense:  10 tablet    Refill:  0    Supervising Provider:   Merrilee Jansky [[id number]]   promethazine-dextromethorphan (PROMETHAZINE-DM) 6.25-15 MG/5ML syrup    Sig: Take 5 mLs by mouth 4 (four) times daily as needed.    Dispense:  118 mL    Refill:  0    Supervising Provider:   Merrilee Jansky [[id number]]   benzonatate (TESSALON) 100 MG capsule    Sig: Take 1-2 capsules (100-200 mg total) by mouth 3 (three) times daily as needed.    Dispense:  30 capsule    Refill:  0    Supervising  Provider:   Merrilee Jansky [[id number]]     *If you need refills on other medications prior to your next appointment, please contact your pharmacy*  Follow-Up: Call back or seek an in-person evaluation if the symptoms worsen or if the condition fails to improve as anticipated.  [facility] [phone]  Other Instructions Acute Bronchitis, Adult  Acute bronchitis is sudden inflammation of the main airways (bronchi) that come off the windpipe (trachea) in the lungs. The swelling causes the airways to get smaller and make more mucus than normal. This can make it hard to breathe and can cause coughing or noisy breathing (wheezing). Acute bronchitis may last several weeks. The cough may last longer. Allergies, asthma, and exposure to smoke may make the condition worse. What are the causes? This condition can be caused by germs and by substances that irritate the lungs, including: Cold and flu viruses. The most common cause of this condition is the virus that causes the common cold. Bacteria. This is less common. Breathing in substances that irritate the lungs, including: Smoke from cigarettes and other forms of tobacco. Dust and pollen. Fumes from household cleaning products, gases, or burned fuel. Indoor or outdoor air pollution. What increases the risk? The following factors may make you more likely to develop this condition: A weak body's defense system, also called the immune system. A condition that affects your lungs and breathing, such as asthma. What are the signs or symptoms? Common symptoms of this condition include: Coughing. This may bring up clear, yellow, or green mucus from your lungs (sputum). Wheezing. Runny or stuffy nose. Having too much mucus in your lungs (chest congestion). Shortness of breath. Aches and pains, including sore throat or chest. How is this diagnosed? This condition is usually diagnosed based on: Your symptoms and medical  history. A physical exam. You may also have other tests, including tests to rule out other conditions, such as pneumonia. These tests include: A test of lung function. Test of a mucus sample to look for the presence of bacteria. Tests to check the oxygen level in your blood. Blood tests. Chest X-ray. How is this treated? Most cases of acute bronchitis clear up over time without treatment. Your health care provider may recommend: Drinking more fluids to help thin your mucus so it is easier to cough up. Taking inhaled medicine (inhaler) to improve air flow in and out of your lungs. Using a vaporizer or a humidifier. These are machines that add water to the air to help you breathe better. Taking a medicine that thins mucus and clears congestion (expectorant). Taking a medicine that prevents or stops coughing (cough suppressant). It is not common to take  an antibiotic medicine for this condition. Follow these instructions at home:  Take over-the-counter and prescription medicines only as told by your health care provider. Use an inhaler, vaporizer, or humidifier as told by your health care provider. Take two teaspoons (10 mL) of honey at bedtime to lessen coughing at night. Drink enough fluid to keep your urine pale yellow. Do not use any products that contain nicotine or tobacco. These products include cigarettes, chewing tobacco, and vaping devices, such as e-cigarettes. If you need help quitting, ask your health care provider. Get plenty of rest. Return to your normal activities as told by your health care provider. Ask your health care provider what activities are safe for you. Keep all follow-up visits. This is important. How is this prevented? To lower your risk of getting this condition again: Wash your hands often with soap and water for at least 20 seconds. If soap and water are not available, use hand sanitizer. Avoid contact with people who have cold symptoms. Try not to touch  your mouth, nose, or eyes with your hands. Avoid breathing in smoke or chemical fumes. Breathing smoke or chemical fumes will make your condition worse. Get the flu shot every year. Contact a health care provider if: Your symptoms do not improve after 2 weeks. You have trouble coughing up the mucus. Your cough keeps you awake at night. You have a fever. Get help right away if you: Cough up blood. Feel pain in your chest. Have severe shortness of breath. Faint or keep feeling like you are going to faint. Have a severe headache. Have a fever or chills that get worse. These symptoms may represent a serious problem that is an emergency. Do not wait to see if the symptoms will go away. Get medical help right away. Call your local emergency services (911 in the [place] Do not drive yourself to the hospital. Summary Acute bronchitis is inflammation of the main airways (bronchi) that come off the windpipe (trachea) in the lungs. The swelling causes the airways to get smaller and make more mucus than normal. Drinking more fluids can help thin your mucus so it is easier to cough up. Take over-the-counter and prescription medicines only as told by your health care provider. Do not use any products that contain nicotine or tobacco. These products include cigarettes, chewing tobacco, and vaping devices, such as e-cigarettes. If you need help quitting, ask your health care provider. Contact a health care provider if your symptoms do not improve after 2 weeks. This information is not intended to replace advice given to you by your health care provider. Make sure you discuss any questions you have with your health care provider. Document Revised: 01/16/2022 Document Reviewed: 02/06/2021 Elsevier Patient Education  2024 Elsevier Inc.   If you have been instructed to have an in-person evaluation today at a local Urgent Care facility, please use the link below. It will take you to a list of all of our  available [facility], including address, phone number and hours of operation. Please do not delay care.  [facility]  If you or a family member do not have a primary care provider, use the link below to schedule a visit and establish care. When you choose a [facility] primary care physician or advanced practice provider, you gain a long-term partner in health. Find a Primary Care Provider  Learn more about [facility]'s in-office and virtual care options: [facility] - Get Care Now

## 2023-12-24 ENCOUNTER — Telehealth: Admitting: Physician Assistant

## 2023-12-24 DIAGNOSIS — R3989 Other symptoms and signs involving the genitourinary system: Secondary | ICD-10-CM

## 2023-12-24 MED ORDER — FLUCONAZOLE 150 MG PO TABS: ORAL_TABLET | ORAL | 0 refills | Status: DC

## 2023-12-24 MED ORDER — CEPHALEXIN 500 MG PO CAPS: 500.0000 mg | ORAL_CAPSULE | Freq: Two times a day (BID) | ORAL | 0 refills | Status: AC

## 2023-12-24 NOTE — Progress Notes (Signed)
 I have spent 5 minutes in review of e-visit questionnaire, review and updating patient chart, medical decision making and response to patient.   Piedad Climes, PA-C

## 2023-12-24 NOTE — Progress Notes (Signed)

## 2024-01-06 ENCOUNTER — Ambulatory Visit: Payer: BC Managed Care – PPO | Admitting: Obstetrics and Gynecology

## 2024-01-06 NOTE — Progress Notes (Unsigned)
   POST OP GYNECOLOGY VISIT  Subjective:  Linda [name] is a 43 y.o. [id number] who is 12 weeks s/p TLH/BS/cysto presenting for post op cuff check    Objective:  There were no vitals filed for this visit.  General:  Alert, oriented and cooperative. Patient is in no acute distress.  Skin: Skin is warm and dry. No rash noted.   Cardiovascular: Normal heart rate noted  Respiratory: Normal respiratory effort, no problems with respiration noted  Abdomen: Soft, non-tender, non-distended   Pelvic: NEFG.   Exam performed in the presence of a chaperone  Assessment and Plan:  Linda [name] is a 43 y.o. with ***  1. Postoperative examination (Primary) OK to resume sexual intercourse   No follow-ups on file.  Future Appointments  Date Time Provider Department Center  01/06/2024  1:30 PM Lennart Pall, MD [facility]  03/07/2024 10:10 AM Christen Butter, NP [facility] None    Lennart Pall, MD

## 2024-01-20 ENCOUNTER — Ambulatory Visit: Payer: BC Managed Care – PPO | Admitting: Medical-Surgical

## 2024-01-21 ENCOUNTER — Encounter: Payer: Self-pay | Admitting: Medical-Surgical

## 2024-01-21 ENCOUNTER — Telehealth: Payer: Self-pay

## 2024-01-21 ENCOUNTER — Other Ambulatory Visit (HOSPITAL_COMMUNITY): Payer: Self-pay

## 2024-01-21 ENCOUNTER — Ambulatory Visit: Admitting: Medical-Surgical

## 2024-01-21 VITALS — BP 120/80 | HR 86 | Resp 20 | Ht 65.0 in | Wt 190.0 lb

## 2024-01-21 DIAGNOSIS — G43109 Migraine with aura, not intractable, without status migrainosus: Secondary | ICD-10-CM

## 2024-01-21 DIAGNOSIS — K219 Gastro-esophageal reflux disease without esophagitis: Secondary | ICD-10-CM

## 2024-01-21 DIAGNOSIS — Z7689 Persons encountering health services in other specified circumstances: Secondary | ICD-10-CM | POA: Diagnosis not present

## 2024-01-21 DIAGNOSIS — F411 Generalized anxiety disorder: Secondary | ICD-10-CM

## 2024-01-21 DIAGNOSIS — I10 Essential (primary) hypertension: Secondary | ICD-10-CM | POA: Diagnosis not present

## 2024-01-21 MED ORDER — VENLAFAXINE HCL ER 75 MG PO CP24: 75.0000 mg | ORAL_CAPSULE | Freq: Every day | ORAL | 1 refills | Status: DC

## 2024-01-21 MED ORDER — CHLORTHALIDONE 25 MG PO TABS: 25.0000 mg | ORAL_TABLET | Freq: Every day | ORAL | 1 refills | Status: DC

## 2024-01-21 MED ORDER — WEGOVY 1 MG/0.5ML ~~LOC~~ SOAJ: 1.0000 mg | SUBCUTANEOUS | 0 refills | Status: DC

## 2024-01-21 MED ORDER — CLONAZEPAM 0.5 MG PO TABS: 0.5000 mg | ORAL_TABLET | Freq: Every day | ORAL | Status: AC | PRN

## 2024-01-21 MED ORDER — PANTOPRAZOLE SODIUM 40 MG PO TBEC: 40.0000 mg | DELAYED_RELEASE_TABLET | Freq: Every day | ORAL | 1 refills | Status: DC

## 2024-01-21 NOTE — Telephone Encounter (Signed)
 Pharmacy Patient Advocate Encounter   Received notification from Patient Pharmacy that prior authorization for [facility] 1 is required/requested.   Insurance verification completed.   The patient is insured through [facility] .   Per test claim: PA required; PA submitted to above mentioned insurance via CoverMyMeds Key/confirmation #/EOC [id number] Status is pending

## 2024-01-21 NOTE — Telephone Encounter (Signed)
 Pharmacy Patient Advocate Encounter  Received notification from [employer] that Prior Authorization for Reginal Lutes has been APPROVED from 01/21/24 to 07/22/24. Ran test claim, Copay is $24.99. This test claim was processed through [facility]- copay amounts may vary at other pharmacies due to pharmacy/plan contracts, or as the patient moves through the different stages of their insurance plan.   PA #/Case ID/Reference #:  [id number]

## 2024-01-21 NOTE — Progress Notes (Unsigned)
        Established patient visit  History, exam, impression, and plan:  1. Essential hypertension (Primary) Very pleasant 43 year old female presenting today with a history of hypertension currently treated with chlorthalidone.  Tolerates the medication well however continues to struggle with varying potassium levels which has become a chronic issue.  Monitoring blood pressure at home and reports readings are at goal.  Following a low-sodium diet and stays physically active as time permits.  Cardiopulmonary exam is normal.  Blood pressure is at goal today.  Continue chlorthalidone as prescribed. - chlorthalidone (HYGROTON) 25 MG tablet; Take 1 tablet (25 mg total) by mouth at bedtime.  Dispense: 90 tablet; Refill: 1  2. GAD (generalized anxiety disorder) History of general anxiety disorder treated with venlafaxine 75 mg daily.  Has clonazepam that she uses on a very sparing basis and only for periods of severe anxiety.  Feels that the medications are working well for her and that she is at a good dose.  Does have a lot of external stressors but understands that these are situational and does not feel an increase in medication would be beneficial.  Mood, affect, speech, and thought pattern all normal during appointment.  Continue venlafaxine as prescribed.  Continue sparing use of clonazepam. - clonazePAM (KLONOPIN) 0.5 MG tablet; Take 1 tablet (0.5 mg total) by mouth daily as needed for anxiety. USE VERY SPARINGLY - venlafaxine XR (EFFEXOR XR) 75 MG 24 hr capsule; Take 1 capsule (75 mg total) by mouth at bedtime.  Dispense: 90 capsule; Refill: 1  3. Gastroesophageal reflux disease, unspecified whether esophagitis present History of IBS along with rectal cancer.  Taking Protonix 40 mg daily, tolerating well without side effects.  Aware of risks of long-term use of PPIs.  Symptoms are well-controlled.  Continue Protonix as prescribed. - pantoprazole (PROTONIX) 40 MG tablet; Take 1 tablet (40 mg total)  by mouth at bedtime.  Dispense: 90 tablet; Refill: 1  4. Encounter for weight management Has been using weight loss injections however notes that she has been off of this for several weeks due to surgery and recovery.  Interested in getting restarted.  She was at the 2.4 mg dose however is aware that restarting this after a few weeks of being without the medication will likely cause severe side effects.  She does not want to go down to the 0.5 mg dose so agreed to restart Wegovy at 1 mg weekly with every 4-week titration back to maintenance dosing. - WEGOVY 1 MG/0.5ML SOAJ; Inject 1 mg into the skin once a week. Use this dose for 1 month (4 shots) and then increase to next higher dose.  Dispense: 2 mL; Refill: 0   Procedures performed this visit: None.  Return in about 6 months (around 07/22/2024) for chronic disease follow up.  __________________________________ Thayer Ohm, DNP, APRN, FNP-BC Primary Care and Sports Medicine [facility] [place]

## 2024-03-07 ENCOUNTER — Ambulatory Visit: Payer: BC Managed Care – PPO | Admitting: Medical-Surgical

## 2024-03-07 ENCOUNTER — Encounter: Payer: Self-pay | Admitting: Medical-Surgical

## 2024-03-07 VITALS — BP 145/89 | HR 78 | Resp 20 | Ht 65.0 in | Wt 189.0 lb

## 2024-03-07 DIAGNOSIS — Z6831 Body mass index (BMI) 31.0-31.9, adult: Secondary | ICD-10-CM

## 2024-03-07 DIAGNOSIS — E6609 Other obesity due to excess calories: Secondary | ICD-10-CM | POA: Diagnosis not present

## 2024-03-07 DIAGNOSIS — Z113 Encounter for screening for infections with a predominantly sexual mode of transmission: Secondary | ICD-10-CM | POA: Diagnosis not present

## 2024-03-07 DIAGNOSIS — I10 Essential (primary) hypertension: Secondary | ICD-10-CM | POA: Diagnosis not present

## 2024-03-07 DIAGNOSIS — E66811 Obesity, class 1: Secondary | ICD-10-CM

## 2024-03-07 NOTE — Progress Notes (Signed)
        Established patient visit  History, exam, impression, and plan:  1. Essential hypertension (Primary) Plan 43 year old female presenting today with a history of essential hypertension currently managed with chlorthalidone  25 mg daily.  Tolerating the medication well without side effects.  Checking blood pressures at home and notes they have been creeping up again and getting into the better range.  She is under quite a bit of situational stress since her husband left and she is now parenting and solely responsible for keeping her home and family taking care of.  She also notes that she is quite stressed with coparenting.  Has been off of Wegovy  since December and has had a bit of weight gain which is certainly playing a role in her blood pressure.  She was slightly elevated on arrival and recheck was at 145/89.  She would like to give it a month to work on weight, diet, and stress levels then recheck and if still elevated, plan to modify her medications.  Continue chlorthalidone  25 mg daily as prescribed.  Continue monitoring blood pressure with a goal of 130/80 or less.  2. Class 1 obesity due to excess calories with serious comorbidity and body mass index (BMI) of 31.0 to 31.9 in adult Previously treated with Wegovy  2.4mg  weekly but has been out of the medication since December due to insurance/availability of the medication. Has several boxes of Wegovy  2.4mg  in her refrigerator but tried to restart at this dose and had severe nausea/vomiting. Planned to reduce her dose to 1mg  weekly but the pharmacy was not able to dispense this due to her recent fill of the 2.4 mg dose.  We have several people that have looked into this but no resolution so far.  I called the pharmacy personally and spoke to the pharmacist regarding the case.  It appears that what is needed is an override as she is not able to take the 2.4 mg dose at this time.  Message sent to our prior Auth team to have them put this into  effect.  Once more information is available, I will reach out to update the patient.  3. Routine screening for STI (sexually transmitted infection) Reports that she and her husband split and that she is now starting to get back into looking for companionship.  Requesting STI testing today.  Ordered below. - STI Profile, CT/NG/TV  Procedures performed this visit: None.  Return in about 4 weeks (around 04/04/2024) for nurse visit for BP check.  __________________________________ Maryl Snook, DNP, APRN, FNP-BC Primary Care and Sports Medicine [facility] [place]

## 2024-03-08 ENCOUNTER — Telehealth: Payer: Self-pay

## 2024-03-08 ENCOUNTER — Other Ambulatory Visit (HOSPITAL_COMMUNITY): Payer: Self-pay

## 2024-03-08 NOTE — Telephone Encounter (Signed)
 Pharmacy Patient Advocate Encounter   Received notification from Physician's Office that prior authorization for Wegovy  1mg /0.36ml is required/requested.   Insurance verification completed.   The patient is insured through [facility] .   Per test claim: Prior authorization required - prescriber call [phone]. Must first use 80% of previous GLP1   Per the representative, there is a prior authorization on file but the insurance will not pay again for a GLP1 until 80% of the previous paid claim has been used. There was a paid claim for Wegovy  2.4mg  on 01/21/2024 for an 84 day supply.  She said the next refill would be around 03/24/2024.

## 2024-03-09 ENCOUNTER — Other Ambulatory Visit: Payer: Self-pay | Admitting: Medical-Surgical

## 2024-03-09 ENCOUNTER — Encounter: Payer: Self-pay | Admitting: Medical-Surgical

## 2024-03-09 ENCOUNTER — Ambulatory Visit: Payer: Self-pay | Admitting: Medical-Surgical

## 2024-03-09 DIAGNOSIS — R52 Pain, unspecified: Secondary | ICD-10-CM

## 2024-03-09 DIAGNOSIS — E66812 Obesity, class 2: Secondary | ICD-10-CM

## 2024-03-09 DIAGNOSIS — G43109 Migraine with aura, not intractable, without status migrainosus: Secondary | ICD-10-CM

## 2024-03-09 LAB — STI PROFILE, CT/NG/TV
Chlamydia by NAA: NEGATIVE
Gonococcus by NAA: NEGATIVE
HCV Ab: NONREACTIVE
HIV Screen 4th Generation wRfx: NONREACTIVE
Hep B Core Total Ab: NEGATIVE
Hep B Surface Ab, Qual: NONREACTIVE
Hepatitis B Surface Ag: NEGATIVE
RPR Ser Ql: NONREACTIVE
Trich vag by NAA: NEGATIVE

## 2024-03-09 LAB — HCV INTERPRETATION

## 2024-04-04 ENCOUNTER — Ambulatory Visit

## 2024-04-18 ENCOUNTER — Ambulatory Visit: Admitting: Medical-Surgical

## 2024-04-18 ENCOUNTER — Encounter: Payer: Self-pay | Admitting: Medical-Surgical

## 2024-04-18 VITALS — BP 115/76 | HR 79 | Resp 20 | Ht 65.0 in | Wt 187.5 lb

## 2024-04-18 DIAGNOSIS — R5383 Other fatigue: Secondary | ICD-10-CM

## 2024-04-18 DIAGNOSIS — I10 Essential (primary) hypertension: Secondary | ICD-10-CM | POA: Diagnosis not present

## 2024-04-18 DIAGNOSIS — E782 Mixed hyperlipidemia: Secondary | ICD-10-CM

## 2024-04-18 NOTE — Progress Notes (Unsigned)
        Established patient visit  History, exam, impression, and plan:  1. Essential hypertension (Primary) Pleasant 43 year old female presenting today with history of essential hypertension.  She is currently taking chlorthalidone  25 mg daily, tolerating well without side effects.  She does have issues with hypokalemia and is taking potassium chloride  40 mill equivalents daily to keep her levels up.  Following a low-sodium diet most of the time.  Increasing activity levels as tolerated.  Denies concerning symptoms today.  Cardiopulmonary exam is normal.  Checking labs as below.  Continue chlorthalidone  as prescribed. - CBC - CMP14+EGFR - Lipid panel  2. Mixed hyperlipidemia History of elevated cholesterol but not currently on medication for management.  She does have ASCVD risk and a statin medication is recommended.  Plan to recheck labs today then reevaluate the need for medication. - CMP14+EGFR - Lipid panel  3. Fatigue, unspecified type Unfortunately she has been experiencing quite a bit of fatigue.  Feels that she is easily able to fall asleep if sitting down during the day for any length of time.  Gets adequate sleep at night but does not feel restored in the morning when she wakes.  This has been going on for approximately 2 months.  There have been no new medications or changes to her daily activities.  Unclear etiology.  Checking labs as noted below for further investigation. - CBC - CMP14+EGFR - TSH - T4, free - VITAMIN D  25 Hydroxy (Vit-D Deficiency, Fractures) - Vitamin B12 - Iron, TIBC and Ferritin Panel  Procedures performed this visit: None.  Return if symptoms worsen or fail to improve.  __________________________________ Zada FREDRIK Palin, DNP, APRN, FNP-BC Primary Care and Sports Medicine [facility] [place]

## 2024-04-19 ENCOUNTER — Ambulatory Visit: Payer: Self-pay | Admitting: Medical-Surgical

## 2024-04-20 ENCOUNTER — Encounter: Payer: Self-pay | Admitting: Medical-Surgical

## 2024-04-20 LAB — CBC
Hematocrit: 45.4 % (ref 34.0–46.6)
Hemoglobin: 15.6 g/dL (ref 11.1–15.9)
MCH: 33.1 pg — ABNORMAL HIGH (ref 26.6–33.0)
MCHC: 34.4 g/dL (ref 31.5–35.7)
MCV: 96 fL (ref 79–97)
Platelets: 332 10*3/uL (ref 150–450)
RBC: 4.71 x10E6/uL (ref 3.77–5.28)
RDW: 11.9 % (ref 11.7–15.4)
WBC: 9.5 10*3/uL (ref 3.4–10.8)

## 2024-04-20 LAB — CMP14+EGFR
ALT: 19 IU/L (ref 0–32)
AST: 24 IU/L (ref 0–40)
Albumin: 4.5 g/dL (ref 3.9–4.9)
Alkaline Phosphatase: 65 IU/L (ref 44–121)
BUN/Creatinine Ratio: 21 (ref 9–23)
BUN: 19 mg/dL (ref 6–24)
Bilirubin Total: 0.7 mg/dL (ref 0.0–1.2)
CO2: 21 mmol/L (ref 20–29)
Calcium: 9.5 mg/dL (ref 8.7–10.2)
Chloride: 97 mmol/L (ref 96–106)
Creatinine, Ser: 0.91 mg/dL (ref 0.57–1.00)
Globulin, Total: 2.8 g/dL (ref 1.5–4.5)
Glucose: 81 mg/dL (ref 70–99)
Potassium: 3.1 mmol/L — ABNORMAL LOW (ref 3.5–5.2)
Sodium: 134 mmol/L (ref 134–144)
Total Protein: 7.3 g/dL (ref 6.0–8.5)
eGFR: 81 mL/min/{1.73_m2} (ref >59)

## 2024-04-20 LAB — LIPID PANEL
Chol/HDL Ratio: 4.4 ratio (ref 0.0–4.4)
Cholesterol, Total: 266 mg/dL — ABNORMAL HIGH (ref 100–199)
HDL: 61 mg/dL (ref >39)
LDL Chol Calc (NIH): 173 mg/dL — ABNORMAL HIGH (ref 0–99)
Triglycerides: 174 mg/dL — ABNORMAL HIGH (ref 0–149)
VLDL Cholesterol Cal: 32 mg/dL (ref 5–40)

## 2024-04-20 LAB — T4, FREE: Free T4: 0.94 ng/dL (ref 0.82–1.77)

## 2024-04-20 LAB — IRON,TIBC AND FERRITIN PANEL
Ferritin: 92 ng/mL (ref 15–150)
Iron Saturation: 32 % (ref 15–55)
Iron: 115 ug/dL (ref 27–159)
Total Iron Binding Capacity: 362 ug/dL (ref 250–450)
UIBC: 247 ug/dL (ref 131–425)

## 2024-04-20 LAB — VITAMIN D 25 HYDROXY (VIT D DEFICIENCY, FRACTURES): Vit D, 25-Hydroxy: 28.2 ng/mL — ABNORMAL LOW (ref 30.0–100.0)

## 2024-04-20 LAB — TSH: TSH: 1.59 u[IU]/mL (ref 0.450–4.500)

## 2024-04-20 LAB — VITAMIN B12: Vitamin B-12: 587 pg/mL (ref 232–1245)

## 2024-04-20 MED ORDER — ATORVASTATIN CALCIUM 10 MG PO TABS: 10.0000 mg | ORAL_TABLET | Freq: Every day | ORAL | 3 refills | Status: DC

## 2024-04-21 ENCOUNTER — Encounter: Payer: Self-pay | Admitting: Medical-Surgical

## 2024-04-26 ENCOUNTER — Ambulatory Visit
Admission: RE | Admit: 2024-04-26 | Discharge: 2024-04-26 | Disposition: A | Discharge status: Home / Self Care | Source: Ambulatory Visit | Attending: Family Medicine | Admitting: Family Medicine

## 2024-04-26 VITALS — BP 135/92 | HR 77 | Temp 98.4°F | Resp 19

## 2024-04-26 DIAGNOSIS — H18891 Other specified disorders of cornea, right eye: Secondary | ICD-10-CM

## 2024-04-26 DIAGNOSIS — H5711 Ocular pain, right eye: Secondary | ICD-10-CM

## 2024-04-26 MED ORDER — KETOROLAC TROMETHAMINE 0.5 % OP SOLN: 1.0000 [drp] | Freq: Four times a day (QID) | OPHTHALMIC | 0 refills | Status: AC

## 2024-04-26 MED ORDER — TOBRAMYCIN-DEXAMETHASONE 0.3-0.1 % OP SUSP: 2.0000 [drp] | Freq: Four times a day (QID) | OPHTHALMIC | 0 refills | Status: AC

## 2024-04-26 NOTE — ED Provider Notes (Signed)
 [name]    CSN: [id number] Arrival date & time: 04/26/24  1124      History   Chief Complaint Chief Complaint  Patient presents with   Eye Problem    HPI Linda [name] is a 43 y.o. female.   HPI 43 year old female presents with left eye pain for 1 day reports taking her contacts out and feeling light sensitivity and watery drainage.  PMH significant for TIA, GAD, and seizures.  Past Medical History:  Diagnosis Date   Abnormal uterine bleeding (AUB)    Anticoagulated    xarelto --- managed by pcp   Depression    Elevated homocysteine    after pt's surgery on 08-19-2023 pt stopped ASA 81mg  and started xarelto  10 mg (08-14-2023  per pt taking asa 81mg , not started xarelto  yet d/t surgery) hematology/ oncology-- lauraine couch NP , evaluated d/t hx multiple suspected provoked DVTs , note in epic 07-17-2023 ,  pt changed to xarelto  from asa 81mg    Fibromyalgia    GAD (generalized anxiety disorder)    GERD (gastroesophageal reflux disease)    History of abnormal cervical Pap smear    w/ HPV   History of DVT of lower extremity    per pt x2  DVT's  in 2012 both LLE ,  suspected provoked   History of hypokalemia    History of seizures 2015   x1 seizure,, normal CT , no EEG done (pt did not do), on meds for few months, then stopped , was evaluted by [facility] in [place] , no seizure since   History of transient ischemic attack (TIA) 2013   unknown etiology per pt ,  none since   Hyperlipidemia, mixed    Hypertension    IBS (irritable bowel syndrome)    Interstitial cystitis    per pt followed IC diet   Menorrhagia with irregular cycle 08/05/2019   Migraine    Pap smear of cervix shows high risk HPV present 09/09/2016   Formatting of this note might be different from the original.  08/2016 Normal cytology and neg for 16/18 HPV.    Needs co-testing in one year 08/2017     >>OVERVIEW FOR HPV IN FEMALE WRITTEN ON 05/18/2023  3:48 PM BY ERIK FELTS C, MD      Reports history of HPV+ pap 2019  02/2021 NILM/HPV neg  04/2023 NILM/HPV neg  [ ]  pap due 2027        Polycythemia    PVC's (premature ventricular contractions)    cardiologist--- dr calhoun   Recurrent UTI    recently started antibiotic on 10-06-2023   S/P cesarean section 07/27/2016   S/P laparoscopic hysterectomy 10/12/2023   S/P tubal ligation 09/02/2016   Wears contact lenses     Patient Active Problem List   Diagnosis Date Noted   Dysfunctional uterine bleeding 08/19/2023   History of migraine 08/29/2022   Burning sensation of skin 03/24/2022   Pruritus of skin 03/24/2022   Axillary lymphadenopathy 03/24/2022   Perirectal abscess 03/07/2022   Daily consumption of alcohol 02/19/2022   Hypokalemia 02/19/2022   Rectal mass 02/04/2022   External hemorrhoid 01/20/2022   Interstitial cystitis 01/13/2022   Shingles outbreak 01/13/2022   Fracture of calcaneus, left, closed 12/19/2019   Migraine with aura and without status migrainosus, not intractable 12/06/2019   Chest pain 12/06/2019   Essential hypertension 12/06/2019   Vitamin D  deficiency 08/04/2016   Seizure (HCC) 01/02/2016   Family history of genetic disease 12/28/2015  GAD (generalized anxiety disorder) 05/01/2015   ADD (attention deficit disorder) 05/01/2015   Fibromyalgia 09/27/2012   IBS (irritable bowel syndrome) 01/01/2011   Palpitations 03/07/2010    Past Surgical History:  Procedure Laterality Date   CARPAL TUNNEL RELEASE Right 1999   CESAREAN SECTION     x2  2009;   last one @ [facility] 07-25-2016  w/ Bilateral Tubal Ligation   CYSTOSCOPY N/A 10/12/2023   Procedure: CYSTOSCOPY;  Surgeon: Erik Kieth BROCKS, MD;  Location[facility];  Service: Gynecology;  Laterality: N/A;   CYSTOSCOPY WITH HYDRODISTENSION AND BIOPSY  01/05/2004   @[facility]  by dr jonelle. evans; w/ instillation therapy &   Insertion of first stage interstim implant   DILATION AND CURETTAGE OF UTERUS  10/26/2010   @[facility]  ;   w/ suction  for blighted ovum   HYSTEROSCOPY WITH D & C N/A 08/19/2023   Procedure: DILATATION AND CURETTAGE /HYSTEROSCOPY;  Surgeon: Erik Kieth BROCKS, MD;  Location: [facility] [place];  Service: Gynecology;  Laterality: N/A;   INTERSTIM IMPLANT REMOVAL  02/2009   Dr Gaston   TOTAL LAPAROSCOPIC HYSTERECTOMY WITH SALPINGECTOMY Bilateral 10/12/2023   Procedure: TOTAL LAPAROSCOPIC HYSTERECTOMY WITH BILATERAL SALPINGECTOMY;  Surgeon: Erik Kieth BROCKS, MD;  Location: [facility];  Service: Gynecology;  Laterality: Bilateral;   TRANSANAL RECTAL RESECTION  03/04/2022   @[facility];   rectal mass    OB History     Gravida  4   Para  2   Term      Preterm      AB  2   Living  2      SAB  2   IAB      Ectopic      Multiple      Live Births               Home Medications    Prior to Admission medications   Medication Sig Start Date End Date Taking? Authorizing Provider  ketorolac  (ACULAR ) 0.5 % ophthalmic solution Place 1 drop into the right eye every 6 (six) hours for 5 days. 04/26/24 05/01/24 Yes Burnetta Kohls, FNP  tobramycin -dexamethasone  (TOBRADEX ) ophthalmic solution Place 2 drops into the right eye 4 (four) times daily for 5 days. 04/26/24 05/01/24 Yes Teddy Sharper, FNP  atorvastatin  (LIPITOR) 10 MG tablet Take 1 tablet (10 mg total) by mouth daily. 04/20/24   Willo Mini, NP  chlorthalidone  (HYGROTON ) 25 MG tablet Take 1 tablet (25 mg total) by mouth at bedtime. 01/21/24   Willo Mini, NP  clonazePAM  (KLONOPIN ) 0.5 MG tablet Take 1 tablet (0.5 mg total) by mouth daily as needed for anxiety. USE VERY SPARINGLY 01/21/24   Willo Mini, NP  fluconazole  (DIFLUCAN ) 150 MG tablet Take 1 tablet PO once. Repeat in 3 days if needed. 12/24/23   Gladis Elsie BROCKS, PA-C  meloxicam  (MOBIC ) 15 MG tablet Take 1 tablet (15 mg total) by mouth daily as needed for pain. 03/09/24   Willo Mini, NP  ondansetron  (ZOFRAN -ODT) 8 MG disintegrating tablet Take 1 tablet (8 mg  total) by mouth every 8 (eight) hours as needed for nausea. 10/12/23   Erik Kieth BROCKS, MD  pantoprazole  (PROTONIX ) 40 MG tablet Take 1 tablet (40 mg total) by mouth at bedtime. 01/21/24   Willo Mini, NP  potassium chloride  SA (KLOR-CON  M) 20 MEQ tablet Take 40 mEq by mouth daily.    [provider]  rivaroxaban  (XARELTO ) 10 MG TABS tablet Take 10 mg by mouth at  bedtime.    [provider]  senna (SENOKOT) 8.6 MG TABS tablet Take 2 tablets (17.2 mg total) by mouth at bedtime as needed for mild constipation. 10/12/23   Erik Kieth BROCKS, MD  topiramate  (TOPAMAX ) 200 MG tablet Take 1 tablet (200 mg total) by mouth at bedtime. 03/09/24   Willo Mini, NP  venlafaxine  XR (EFFEXOR  XR) 75 MG 24 hr capsule Take 1 capsule (75 mg total) by mouth at bedtime. 01/21/24   Willo Mini, NP  WEGOVY  1 MG/0.5ML SOAJ Inject 1 mg into the skin once a week. Use this dose for 1 month (4 shots) and then increase to next higher dose. 01/21/24   Willo Mini, NP    Family History Family History  Problem Relation Age of Onset   Hypertension Mother    Kidney failure Mother    Hypertension Father    Clotting disorder Father    Diabetes Father    Deep vein thrombosis Father    Stroke Maternal Grandmother    Prostate cancer Maternal Grandfather    Stroke Paternal Grandfather    Colon cancer Paternal Grandfather    Breast cancer Maternal Aunt    Factor V Leiden deficiency Maternal Aunt    Deep vein thrombosis Paternal Aunt    Breast cancer Maternal Great-grandmother     Social History Social History   Tobacco Use   Smoking status: Former    Types: Cigarettes    Start date: 2009    Quit date: 2003    Years since quitting: 22.5   Smokeless tobacco: Never   Tobacco comments:    08-14-2023  per pt quit smoking age 71 (2009), started age 74 (2003)  Vaping Use   Vaping status: Never Used  Substance Use Topics   Alcohol use: Yes    Comment: occasionally   Drug use: Not Currently    Comment:  per pt last used age 61s     Allergies   Chocolate, Cocoa butter, Iodinated contrast media, Propoxyphene, Sulfa antibiotics, Codeine, Lisinopril , and Oxycodone    Review of Systems Review of Systems  Eyes:  Positive for pain and redness.     Physical Exam Triage Vital Signs ED Triage Vitals  Encounter Vitals Group     BP      Girls Systolic BP Percentile      Girls Diastolic BP Percentile      Boys Systolic BP Percentile      Boys Diastolic BP Percentile      Pulse      Resp      Temp      Temp src      SpO2      Weight      Height      Head Circumference      Peak Flow      Pain Score      Pain Loc      Pain Education      Exclude from Growth Chart    No data found.  Updated Vital Signs BP (!) 135/92   Pulse 77   Temp 98.4 F (36.9 C)   Resp 19   LMP 06/03/2023   SpO2 97%    Physical Exam Vitals and nursing note reviewed.  Constitutional:      Appearance: Normal appearance. She is normal weight.  HENT:     Head: Normocephalic and atraumatic.     Mouth/Throat:     Mouth: Mucous membranes are moist.     Pharynx: Oropharynx is clear.  Eyes:     Extraocular Movements: Extraocular movements intact.     Conjunctiva/sclera: Conjunctivae normal.     Pupils: Pupils are equal, round, and reactive to light.     Comments: Right eye: Inspected with lips appears to have a mild corneal abrasion, sclera with +2 injection  Cardiovascular:     Rate and Rhythm: Normal rate and regular rhythm.     Pulses: Normal pulses.     Heart sounds: Normal heart sounds.  Pulmonary:     Effort: Pulmonary effort is normal.     Breath sounds: Normal breath sounds. No wheezing, rhonchi or rales.  Musculoskeletal:        General: Normal range of motion.  Skin:    General: Skin is warm and dry.  Neurological:     General: No focal deficit present.     Mental Status: She is alert and oriented to person, place, and time. Mental status is at baseline.  Psychiatric:        Mood  and Affect: Mood normal.        Behavior: Behavior normal.      UC Treatments / Results  Labs (all labs ordered are listed, but only abnormal results are displayed) Labs Reviewed - No data to display  EKG   Radiology No results found.  Procedures Procedures (including critical care time)  Medications Ordered in UC Medications - No data to display  Initial Impression / Assessment and Plan / UC Course  I have reviewed the triage vital signs and the nursing notes.  Pertinent labs & imaging results that were available during my care of the patient were reviewed by me and considered in my medical decision making (see chart for details).     MDM: 1.  Corneal irritation of right eye-presumably from retained contact lens.  Rx'd TobraDex  ophthalmic solution: Place 2 drops into right eye 4 times daily x 5 days; 2.  Acute right eye pain-Rx'd Acular  0.5% ophthalmic solution please 1 drop into right eye every 6 hours, as needed. Advised patient to instill eyedrops as directed.  Advised patient to wait 10 minutes prior to instilling Acular  ophthalmic solution after TobraDex  ophthalmic solution.  Advised patient to wear glasses for the next 5 days and replace new right contact lens after 5 days.  Advised if symptoms worsen and/or unresolved please follow-up with your optometrist/ophthalmologist for further evaluation.  Final Clinical Impressions(s) / UC Diagnoses   Final diagnoses:  Corneal irritation of right eye  Acute right eye pain     Discharge Instructions      Advised patient to instill eyedrops as directed.  Advised patient to wait 10 minutes prior to instilling Acular  ophthalmic solution after TobraDex  ophthalmic solution.  Advised patient to wear glasses for the next 5 days and replace new right contact lens after 5 days.  Advised if symptoms worsen and/or unresolved please follow-up with your optometrist/ophthalmologist for further evaluation.     ED Prescriptions      Medication Sig Dispense Auth. Provider   tobramycin -dexamethasone  (TOBRADEX ) ophthalmic solution Place 2 drops into the right eye 4 (four) times daily for 5 days. 5 mL Linda Hackley, FNP   ketorolac  (ACULAR ) 0.5 % ophthalmic solution Place 1 drop into the right eye every 6 (six) hours for 5 days. 5 mL Linda Bain, FNP      PDMP not reviewed this encounter.   Teddy Sharper, FNP 04/26/24 1233

## 2024-04-26 NOTE — Discharge Instructions (Addendum)
 Advised patient to instill eyedrops as directed.  Advised patient to wait 10 minutes prior to instilling Acular  ophthalmic solution after TobraDex  ophthalmic solution.  Advised patient to wear glasses for the next 5 days and replace new right contact lens after 5 days.  Advised if symptoms worsen and/or unresolved please follow-up with your optometrist/ophthalmologist for further evaluation.

## 2024-04-26 NOTE — ED Triage Notes (Signed)
 Pt presents to uc with co eye pain since yesterday.pt reports she took her contact out yesterday and since then she has had light sensitivity in that eye and pain and clear watery drainage.

## 2024-05-04 ENCOUNTER — Ambulatory Visit: Admitting: Medical-Surgical

## 2024-05-12 DIAGNOSIS — M436 Torticollis: Secondary | ICD-10-CM | POA: Diagnosis not present

## 2024-05-12 DIAGNOSIS — M9901 Segmental and somatic dysfunction of cervical region: Secondary | ICD-10-CM | POA: Diagnosis not present

## 2024-05-12 DIAGNOSIS — G44209 Tension-type headache, unspecified, not intractable: Secondary | ICD-10-CM | POA: Diagnosis not present

## 2024-05-30 ENCOUNTER — Encounter: Payer: Self-pay | Admitting: Medical-Surgical

## 2024-05-30 MED ORDER — WEGOVY 1.7 MG/0.75ML ~~LOC~~ SOAJ: 1.7000 mg | SUBCUTANEOUS | 0 refills | Status: DC

## 2024-06-27 ENCOUNTER — Other Ambulatory Visit: Payer: Self-pay | Admitting: Medical-Surgical

## 2024-07-12 ENCOUNTER — Ambulatory Visit: Admitting: Medical-Surgical

## 2024-07-12 ENCOUNTER — Encounter: Payer: Self-pay | Admitting: Medical-Surgical

## 2024-07-12 VITALS — BP 126/84 | HR 77 | Resp 20 | Ht 65.0 in | Wt 190.0 lb

## 2024-07-12 DIAGNOSIS — Z6831 Body mass index (BMI) 31.0-31.9, adult: Secondary | ICD-10-CM

## 2024-07-12 DIAGNOSIS — E66811 Obesity, class 1: Secondary | ICD-10-CM

## 2024-07-12 DIAGNOSIS — R21 Rash and other nonspecific skin eruption: Secondary | ICD-10-CM | POA: Diagnosis not present

## 2024-07-12 DIAGNOSIS — E663 Overweight: Secondary | ICD-10-CM | POA: Diagnosis not present

## 2024-07-12 DIAGNOSIS — K5903 Drug induced constipation: Secondary | ICD-10-CM | POA: Insufficient documentation

## 2024-07-12 DIAGNOSIS — E6609 Other obesity due to excess calories: Secondary | ICD-10-CM | POA: Insufficient documentation

## 2024-07-12 MED ORDER — DOCUSATE SODIUM 100 MG PO CAPS: 100.0000 mg | ORAL_CAPSULE | Freq: Two times a day (BID) | ORAL | 3 refills | Status: AC | PRN

## 2024-07-12 MED ORDER — NYSTATIN-TRIAMCINOLONE 100000-0.1 UNIT/GM-% EX OINT: 1.0000 | TOPICAL_OINTMENT | Freq: Two times a day (BID) | CUTANEOUS | 0 refills | Status: AC

## 2024-07-12 MED ORDER — ONDANSETRON 8 MG PO TBDP: 8.0000 mg | ORAL_TABLET | Freq: Three times a day (TID) | ORAL | 1 refills | Status: DC | PRN

## 2024-07-12 NOTE — Progress Notes (Signed)
        Established patient visit   History of Present Illness   Discussed the use of AI scribe software for clinical note transcription with the patient, who gave verbal consent to proceed.  History of Present Illness   Linda [name] is a 43 year old female who presents with a persistent facial rash and constipation issues.  Facial rash - Persistent scaly and pruritic rash on both nasolabial folds since July - Worsens with scratching - No pustular formation, drainage, or bleeding unless scratched excessively - No improvement with mild face wash or all-natural tallow moisturizer - Rash persists despite cooler weather  Constipation - Significant constipation since restarting Wegovy  at a 1.7 mg dose - No prior constipation with previous Wegovy  use - Hesitant to use stool softeners due to fluctuating GI symptoms - Fluid intake includes occasional coffee and chai tea; avoids sodas  Weight loss progression - Less weight loss progress compared to previous use of Wegovy        Physical Exam   Physical Exam Vitals reviewed.  Constitutional:      General: She is not in acute distress.    Appearance: Normal appearance.  HENT:     Head: Normocephalic and atraumatic.  Cardiovascular:     Rate and Rhythm: Normal rate and regular rhythm.     Pulses: Normal pulses.     Heart sounds: Normal heart sounds. No murmur heard.    No friction rub. No gallop.  Pulmonary:     Effort: Pulmonary effort is normal. No respiratory distress.     Breath sounds: Normal breath sounds. No wheezing.  Skin:    General: Skin is warm and dry.     Findings: Rash present.  Neurological:     Mental Status: She is alert and oriented to person, place, and time.  Psychiatric:        Mood and Affect: Mood normal.        Behavior: Behavior normal.        Thought Content: Thought content normal.        Judgment: Judgment normal.    Assessment & Plan     Facial dermatitis with pruritus and scaling Chronic  facial dermatitis with pruritus and scaling since July. Differential includes dermatitis and possible fungal component. - Prescribed Microlog cream to apply to affected areas twice daily for up to 14 days.  Constipation associated with weight loss medication Constipation associated with Wegovy  for weight management. Concerns about exacerbation with increased dosage. - Recommend Colace 100 mg once or twice daily as needed. - Advise increasing water intake. - Consider Miralax if Colace is not effective.  Obesity Obesity management with Wegovy . Current dose is 1.7 mg. Concerns about water retention and constipation affecting weight loss progress. - Continue Wegovy  at 1.7 mg for another month. - Reassess weight and medication efficacy in 3 weeks.  Recurrent nausea Recurrent nausea managed with Zofran . - Refilled Zofran  prescription.     Follow up   Return in about 3 weeks (around 08/02/2024) for rash/weight follow up. __________________________________ Zada FREDRIK Palin, DNP, APRN, FNP-BC Primary Care and Sports Medicine [facility] [place]

## 2024-07-16 ENCOUNTER — Other Ambulatory Visit: Payer: Self-pay | Admitting: Medical-Surgical

## 2024-07-16 DIAGNOSIS — F411 Generalized anxiety disorder: Secondary | ICD-10-CM

## 2024-07-23 ENCOUNTER — Encounter: Payer: Self-pay | Admitting: Medical-Surgical

## 2024-07-26 NOTE — Progress Notes (Deleted)

## 2024-07-27 ENCOUNTER — Ambulatory Visit: Admitting: Medical-Surgical

## 2024-08-02 ENCOUNTER — Ambulatory Visit: Admitting: Medical-Surgical

## 2024-08-03 ENCOUNTER — Ambulatory Visit: Admitting: Obstetrics and Gynecology

## 2024-09-01 ENCOUNTER — Ambulatory Visit: Admitting: Medical-Surgical

## 2024-09-01 ENCOUNTER — Encounter: Payer: Self-pay | Admitting: Medical-Surgical

## 2024-09-01 VITALS — BP 142/94 | HR 91 | Resp 20 | Ht 65.0 in | Wt 199.1 lb

## 2024-09-01 DIAGNOSIS — E6609 Other obesity due to excess calories: Secondary | ICD-10-CM

## 2024-09-01 DIAGNOSIS — E66811 Other obesity due to excess calories: Secondary | ICD-10-CM

## 2024-09-01 DIAGNOSIS — Z6833 Body mass index (BMI) 33.0-33.9, adult: Secondary | ICD-10-CM

## 2024-09-01 DIAGNOSIS — L989 Disorder of the skin and subcutaneous tissue, unspecified: Secondary | ICD-10-CM

## 2024-09-01 DIAGNOSIS — I10 Essential (primary) hypertension: Secondary | ICD-10-CM | POA: Diagnosis not present

## 2024-09-01 MED ORDER — SPIRONOLACTONE 25 MG PO TABS: ORAL_TABLET | ORAL | 3 refills | Status: AC

## 2024-09-01 MED ORDER — TIRZEPATIDE 10 MG/0.5ML ~~LOC~~ SOAJ: 10.0000 mg | SUBCUTANEOUS | 0 refills | Status: DC

## 2024-09-01 MED ORDER — FLUCONAZOLE 150 MG PO TABS: 150.0000 mg | ORAL_TABLET | Freq: Once | ORAL | 0 refills | Status: AC

## 2024-09-01 MED ORDER — CEPHALEXIN 500 MG PO CAPS: 500.0000 mg | ORAL_CAPSULE | Freq: Two times a day (BID) | ORAL | 0 refills | Status: DC

## 2024-09-01 NOTE — Progress Notes (Signed)
        Established patient visit   History of Present Illness   Discussed the use of AI scribe software for clinical note transcription with the patient, who gave verbal consent to proceed.  History of Present Illness   Linda [name] is a 43 year old female who presents with persistent skin lesions on her buttocks.  Cutaneous lesions - Has a persistent skin lesion on the buttocks for one month - Initially appeared as possible bug bites - Lesions remain sore and irritated, especially with sitting or friction from clothing - No increase in size since onset - No drainage or formation of a head - Epsom salt baths have not provided significant improvement  Peripheral edema - Swelling present, particularly in the ankles - Discontinued chlorthalidone  due to concerns about potassium levels  Weight management - Currently taking Wegovy  2.4 mg for three weeks without noticeable effect - Engages in regular cardio and weightlifting exercises - Maintains a stable diet with minimal fried foods  Physical Exam   Physical Exam Vitals reviewed.  Constitutional:      General: She is not in acute distress.    Appearance: Normal appearance.  HENT:     Head: Normocephalic and atraumatic.  Cardiovascular:     Rate and Rhythm: Normal rate and regular rhythm.     Pulses: Normal pulses.     Heart sounds: Normal heart sounds. No murmur heard.    No friction rub. No gallop.  Pulmonary:     Effort: Pulmonary effort is normal. No respiratory distress.     Breath sounds: Normal breath sounds. No wheezing.  Skin:    General: Skin is warm and dry.     Findings: Lesion present.  Neurological:     Mental Status: She is alert and oriented to person, place, and time.  Psychiatric:        Mood and Affect: Mood normal.        Behavior: Behavior normal.        Thought Content: Thought content normal.        Judgment: Judgment normal.    Assessment & Plan     Skin lesion Unclear etiology but the area  is discolored and tender. Suspect deep abscess vs cellulitis. - Prescribed Keflex  500mg  BID for 7 days. - Continue conservative measures for site care.  Class 1 obesity due to excess calories with serious comorbidity and body mass index (BMI) of 33.0 to 33.9 in adult  Current use of Wegovy  at 2.4 mg with no significant weight change. Discussed tirzepatide as an alternative, targeting hunger and fat cells. Insurance coverage may depend on BMI and comorbidities. - Plan switch to tirzepatide depending on insurance approval.  - Advised to continue Wegovy  until tirzepatide is approved and available. - Continue dietary modification and regular intentional exercise.   Essential hypertension with edema Hypertension with recent readings of 156/100 mmHg and associated edema. Previous chlorthalidone  discontinued due to potassium concerns. Discussed spironolactone as a potassium-sparing diuretic. - Prescribed spironolactone 25 mg daily, with potential increase to 50 mg if blood pressure remains high and edema persists. - Advised home blood pressure monitoring and to report persistent high readings or significant edema. - Will schedule follow-up in two weeks for blood pressure check and metabolic panel to monitor potassium levels.   Follow up   Return in about 2 weeks (around 09/15/2024) for nurse visit for BP check. __________________________________ Zada FREDRIK Palin, DNP, APRN, FNP-BC Primary Care and Sports Medicine [facility] [place]

## 2024-09-03 ENCOUNTER — Encounter: Payer: Self-pay | Admitting: Medical-Surgical

## 2024-09-03 MED ORDER — ZEPBOUND 10 MG/0.5ML ~~LOC~~ SOAJ: 10.0000 mg | SUBCUTANEOUS | 0 refills | Status: DC

## 2024-09-05 ENCOUNTER — Other Ambulatory Visit: Payer: Self-pay | Admitting: Medical Genetics

## 2024-09-06 ENCOUNTER — Other Ambulatory Visit (HOSPITAL_COMMUNITY): Payer: Self-pay

## 2024-09-06 ENCOUNTER — Telehealth: Payer: Self-pay

## 2024-09-06 ENCOUNTER — Encounter: Payer: Self-pay | Admitting: Medical-Surgical

## 2024-09-06 NOTE — Telephone Encounter (Signed)
 Per the patient message - a prior authorization is required for the [facility] rx. Thanks in advance.

## 2024-09-06 NOTE — Telephone Encounter (Signed)
 Pharmacy Patient Advocate Encounter   Received notification from Patient Advice Request messages that prior authorization for Zepbound 10mg /0.66ml is required/requested.   Insurance verification completed.   The patient is insured through [facility].   Per test claim: PA required; PA submitted to above mentioned insurance via Latent Key/confirmation #/EOC [id number] Status is pending

## 2024-09-06 NOTE — Telephone Encounter (Signed)
 Pharmacy Patient Advocate Encounter  Received notification from [facility] that Prior Authorization for Zepbound 10mg /0.44ml has been APPROVED from 09/06/24 to 03/06/25   PA #/Case ID/Reference #: [id number]

## 2024-09-09 NOTE — Progress Notes (Unsigned)
   Subjective:    Patient ID: Linda [name], female    DOB: 04/12/1981, 43 y.o.   MRN: [id number]  HPI  Patient is here for a 2 week BP check and metabolic lab draw. Last OV 142/94. Per Joys last ON Prescribed spironolactone  25 mg daily, with potential increase to 50 mg if blood pressure remains high and edema persists. - Advised home blood pressure monitoring and to report persistent high readings or significant edema.   Review of Systems     Objective:   Physical Exam        Assessment & Plan:   Patients first BP is Second reading is reported to PCP Joy who

## 2024-09-12 ENCOUNTER — Ambulatory Visit

## 2024-09-17 ENCOUNTER — Other Ambulatory Visit: Payer: Self-pay | Admitting: Medical-Surgical

## 2024-09-17 DIAGNOSIS — G43109 Migraine with aura, not intractable, without status migrainosus: Secondary | ICD-10-CM

## 2024-09-17 DIAGNOSIS — E66812 Obesity, class 2: Secondary | ICD-10-CM

## 2024-09-19 ENCOUNTER — Other Ambulatory Visit: Payer: Self-pay | Admitting: Medical-Surgical

## 2024-09-19 DIAGNOSIS — I10 Essential (primary) hypertension: Secondary | ICD-10-CM

## 2024-09-19 DIAGNOSIS — K219 Gastro-esophageal reflux disease without esophagitis: Secondary | ICD-10-CM

## 2024-09-19 DIAGNOSIS — R52 Pain, unspecified: Secondary | ICD-10-CM

## 2024-09-27 MED ORDER — TIRZEPATIDE-WEIGHT MANAGEMENT 12.5 MG/0.5ML ~~LOC~~ SOAJ: 12.5000 mg | SUBCUTANEOUS | 0 refills | Status: DC

## 2024-11-01 ENCOUNTER — Encounter: Payer: Self-pay | Admitting: Medical-Surgical

## 2024-11-01 DIAGNOSIS — E6609 Other obesity due to excess calories: Secondary | ICD-10-CM

## 2024-11-01 NOTE — Telephone Encounter (Signed)
 Requesting rx rf of Zepbound  with strength increase along with medication for nausea Last written as Zepbound  12.5mg  01/17/2025 Last OV 09/01/2024 Upcoming appt = none   Also pended zofran  prescription for refill

## 2024-11-01 NOTE — Addendum Note (Signed)
 Addended by: Dezzie Badilla P on: 11/01/2024 04:24 PM   Modules accepted: Orders

## 2024-11-02 MED ORDER — ONDANSETRON 8 MG PO TBDP: 8.0000 mg | ORAL_TABLET | Freq: Three times a day (TID) | ORAL | 1 refills | Status: AC | PRN

## 2024-11-02 MED ORDER — TIRZEPATIDE-WEIGHT MANAGEMENT 15 MG/0.5ML ~~LOC~~ SOAJ: 15.0000 mg | SUBCUTANEOUS | 1 refills | Status: DC

## 2024-11-02 NOTE — Addendum Note (Signed)
 Addended byBETHA WILLO MINI on: 11/02/2024 08:14 AM   Modules accepted: Orders

## 2024-11-03 ENCOUNTER — Other Ambulatory Visit: Payer: Self-pay | Admitting: Medical Genetics

## 2024-11-03 DIAGNOSIS — Z006 Encounter for examination for normal comparison and control in clinical research program: Secondary | ICD-10-CM

## 2024-11-10 NOTE — Progress Notes (Unsigned)

## 2024-11-11 ENCOUNTER — Ambulatory Visit: Admitting: Medical-Surgical

## 2024-11-14 ENCOUNTER — Other Ambulatory Visit (HOSPITAL_COMMUNITY)

## 2024-11-15 ENCOUNTER — Telehealth: Payer: Self-pay

## 2024-11-15 ENCOUNTER — Other Ambulatory Visit (HOSPITAL_COMMUNITY): Payer: Self-pay

## 2024-11-15 NOTE — Telephone Encounter (Signed)
 Pharmacy Patient Advocate Encounter   Received notification from Patient Advice Request messages that prior authorization for Zepbound  15mg /0.1ml is required/requested.   Insurance verification completed.   The patient is insured through [facility].   Per test claim: PA required; PA submitted to above mentioned insurance via Latent Key/confirmation #/EOC [id number] Status is pending

## 2024-11-15 NOTE — Telephone Encounter (Signed)
 Pharmacy Patient Advocate Encounter  Received notification from [employer] that Prior Authorization for Zepbound  15mg /0.37ml has been DENIED.  See denial reason below. No denial letter attached in CMM. Will attach denial letter to Media tab once received.   PA #/Case ID/Reference #: [id number]

## 2024-11-17 ENCOUNTER — Encounter: Payer: Self-pay | Admitting: Medical-Surgical

## 2024-11-17 ENCOUNTER — Ambulatory Visit: Admitting: Medical-Surgical

## 2024-11-17 VITALS — BP 160/108 | HR 84 | Temp 98.6°F | Ht 65.0 in | Wt 205.0 lb

## 2024-11-17 DIAGNOSIS — I10 Essential (primary) hypertension: Secondary | ICD-10-CM | POA: Diagnosis not present

## 2024-11-17 DIAGNOSIS — N3000 Acute cystitis without hematuria: Secondary | ICD-10-CM

## 2024-11-17 DIAGNOSIS — L989 Disorder of the skin and subcutaneous tissue, unspecified: Secondary | ICD-10-CM | POA: Diagnosis not present

## 2024-11-17 LAB — POCT URINALYSIS DIP (CLINITEK)
Bilirubin, UA: NEGATIVE
Blood, UA: NEGATIVE
Glucose, UA: NEGATIVE mg/dL
Ketones, POC UA: NEGATIVE mg/dL
Nitrite, UA: NEGATIVE
POC PROTEIN,UA: NEGATIVE
Spec Grav, UA: 1.015
Urobilinogen, UA: 0.2 U/dL
pH, UA: 7

## 2024-11-17 MED ORDER — AMOXICILLIN-POT CLAVULANATE 875-125 MG PO TABS: 1.0000 | ORAL_TABLET | Freq: Two times a day (BID) | ORAL | 0 refills | Status: AC

## 2024-11-17 MED ORDER — FLUCONAZOLE 150 MG PO TABS: 150.0000 mg | ORAL_TABLET | Freq: Once | ORAL | 0 refills | Status: AC

## 2024-11-17 NOTE — Progress Notes (Signed)
" ° °       Established patient visit   History of Present Illness   Discussed the use of AI scribe software for clinical note transcription with the patient, who gave verbal consent to proceed.  History of Present Illness   Linda [name] is a 44 year old female who presents with urinary symptoms and concerns about a persistent skin lesion.  Urinary symptoms - Dysuria for several weeks, described as burning sensation - Associated urinary frequency and urgency, worsened after intercourse - Hot baths and Azo provide only brief relief - No improvement after 1-week course of Keflex  - No fever or chills - No concerns for sexually transmitted infections  Cutaneous lesion - Persistent painful spot on buttock - Lesion had previously faded but has recently become raised again  Medication access issues - Unable to obtain Zepbound  for past two weeks due to insurance denial for weight-loss coverage - Has not taken Zepbound  during this time   Hypertension - Previously prescribed chlorthalidone  but admits that she has not been taking it - Asymptomatic with hypertension - Not monitoring BP at home     Physical Exam   Physical Exam Vitals reviewed.  Constitutional:      General: She is not in acute distress.    Appearance: Normal appearance. She is not ill-appearing.  HENT:     Head: Normocephalic and atraumatic.  Cardiovascular:     Rate and Rhythm: Normal rate and regular rhythm.     Pulses: Normal pulses.     Heart sounds: Normal heart sounds. No murmur heard.    No friction rub. No gallop.  Pulmonary:     Effort: Pulmonary effort is normal. No respiratory distress.     Breath sounds: Normal breath sounds. No wheezing.  Skin:    General: Skin is warm and dry.  Neurological:     Mental Status: She is alert and oriented to person, place, and time.  Psychiatric:        Mood and Affect: Mood normal.        Behavior: Behavior normal.        Thought Content: Thought content normal.         Judgment: Judgment normal.    Assessment & Plan     Acute urinary tract infection Symptoms of dysuria, frequency, and urgency. Urinalysis shows moderate leukocytes. Previous Keflex  treatment ineffective. Differential includes possible STIs. - Prescribed Augmentin  for UTI and skin lesion coverage. - Ordered urine culture for sensitivities. - Ordered urine tests for gonorrhea, chlamydia, and trichomonas.  Skin lesion of buttock Persistent lesion on buttock, initially faded but recently raised again. Previous treatment showed improvement but not full resolution, now flared again. - Prescribed Augmentin  for skin lesion coverage.  Hypertension Blood pressure poorly controlled. - Rechecked blood pressure still elevated.  - Resume chlorthalidone  25mg  daily as prescribed.     Follow up   No follow-ups on file. __________________________________ Zada FREDRIK Palin, DNP, APRN, FNP-BC Primary Care and Sports Medicine [facility] [place] "

## 2024-11-18 ENCOUNTER — Ambulatory Visit: Payer: Self-pay | Admitting: Medical-Surgical

## 2024-11-18 LAB — CHLAMYDIA/GONOCOCCUS/TRICHOMONAS, NAA
Chlamydia by NAA: NEGATIVE
Gonococcus by NAA: NEGATIVE
Trich vag by NAA: NEGATIVE

## 2024-11-22 LAB — URINE CULTURE

## 2024-11-22 MED ORDER — CEFDINIR 300 MG PO CAPS: 300.0000 mg | ORAL_CAPSULE | Freq: Two times a day (BID) | ORAL | 0 refills | Status: AC

## 2024-11-22 MED ORDER — FLUCONAZOLE 150 MG PO TABS: 150.0000 mg | ORAL_TABLET | Freq: Once | ORAL | 0 refills | Status: AC

## 2024-11-22 NOTE — Addendum Note (Signed)
 Addended byBETHA WILLO MINI on: 11/22/2024 08:06 PM   Modules accepted: Orders
